# Patient Record
Sex: Male | Born: 1955 | Race: White | Hispanic: No | Marital: Married | Smoking: Never smoker
Health system: Southern US, Community
[De-identification: ages and names within clinical notes are randomized; demographics above are authoritative.]

## PROBLEM LIST (undated history)

## (undated) DIAGNOSIS — I4891 Unspecified atrial fibrillation: Principal | ICD-10-CM

## (undated) DIAGNOSIS — N401 Enlarged prostate with lower urinary tract symptoms: Secondary | ICD-10-CM

## (undated) DIAGNOSIS — Z1211 Encounter for screening for malignant neoplasm of colon: Secondary | ICD-10-CM

## (undated) DIAGNOSIS — E785 Hyperlipidemia, unspecified: Principal | ICD-10-CM

## (undated) DIAGNOSIS — R3916 Straining to void: Principal | ICD-10-CM

## (undated) DIAGNOSIS — R6889 Other general symptoms and signs: Secondary | ICD-10-CM

## (undated) DIAGNOSIS — H9193 Unspecified hearing loss, bilateral: Secondary | ICD-10-CM

## (undated) DIAGNOSIS — I10 Essential (primary) hypertension: Secondary | ICD-10-CM

## (undated) DIAGNOSIS — I214 Non-ST elevation (NSTEMI) myocardial infarction: Secondary | ICD-10-CM

## (undated) DIAGNOSIS — N200 Calculus of kidney: Secondary | ICD-10-CM

## (undated) DIAGNOSIS — N179 Acute kidney failure, unspecified: Secondary | ICD-10-CM

## (undated) DIAGNOSIS — E7800 Pure hypercholesterolemia, unspecified: Secondary | ICD-10-CM

## (undated) DIAGNOSIS — I2510 Atherosclerotic heart disease of native coronary artery without angina pectoris: Secondary | ICD-10-CM

## (undated) DIAGNOSIS — K219 Gastro-esophageal reflux disease without esophagitis: Secondary | ICD-10-CM

## (undated) HISTORY — PX: TONSILLECTOMY: SUR1361

## (undated) HISTORY — DX: Essential (primary) hypertension: I10

---
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
## 2011-03-18 ENCOUNTER — Encounter: Payer: Self-pay | Admitting: *Deleted

## 2011-03-18 ENCOUNTER — Emergency Department
Admission: EM | Admit: 2011-03-18 | Discharge: 2011-03-18 | Disposition: A | Discharge status: Home / Self Care | Payer: Self-pay | Source: Home / Self Care | Attending: Emergency Medicine | Admitting: Emergency Medicine

## 2011-03-18 DIAGNOSIS — R059 Cough, unspecified: Secondary | ICD-10-CM

## 2011-03-18 DIAGNOSIS — J069 Acute upper respiratory infection, unspecified: Secondary | ICD-10-CM

## 2011-03-18 DIAGNOSIS — R05 Cough: Secondary | ICD-10-CM

## 2011-03-18 MED ORDER — HYDROCODONE-HOMATROPINE 5-1.5 MG/5ML PO SYRP: 5.0000 mL | ORAL_SOLUTION | Freq: Four times a day (QID) | ORAL | Status: AC | PRN

## 2011-03-18 MED ORDER — AZITHROMYCIN 250 MG PO TABS: ORAL_TABLET | ORAL | Status: AC

## 2011-03-18 NOTE — ED Provider Notes (Signed)
History     CSN: [id number] Arrival date & time: 03/18/2011  4:13 PM   None     Chief Complaint  Patient presents with  . Sore Throat  . Cough    (Consider location/radiation/quality/duration/timing/severity/associated sxs/prior treatment) HPI Karsen is a 55 y.o. male who complains of onset of cold symptoms for 3 days.  They are using Mucinex which helps a little bit. + sore throat + cough No pleuritic pain No wheezing + nasal congestion + post-nasal drainage + sinus pain/pressure No chest congestion No itchy/red eyes No earache No hemoptysis No SOB No chills/sweats No fever No nausea No vomiting No abdominal pain No diarrhea No skin rashes No fatigue No myalgias No headache    History reviewed. No pertinent past medical history.  Past Surgical History  Procedure Date  . Tonsillectomy   . Inner ear surgery     Family History  Problem Relation Age of Onset  . Heart failure Mother     History  Substance Use Topics  . Smoking status: Never Smoker   . Smokeless tobacco: Not on file  . Alcohol Use: No      Review of Systems  Allergies  Amoxicillin  Home Medications   Current Outpatient Rx  Name Route Sig Dispense Refill  . ASPIRIN 81 MG PO TABS Oral Take 81 mg by mouth daily.      . MULTIVITAMINS PO CAPS Oral Take 1 capsule by mouth daily.      [name] RANITIDINE HCL 150 MG PO TABS Oral Take 150 mg by mouth 2 (two) times daily.      . AZITHROMYCIN 250 MG PO TABS  Use as directed 1 each 0  . HYDROCODONE-HOMATROPINE 5-1.5 MG/5ML PO SYRP Oral Take 5 mLs by mouth every 6 (six) hours as needed for cough. 120 mL 0    BP 176/95  Pulse 89  Temp(Src) 99.3 F (37.4 C) (Oral)  Resp 18  Ht 5\' 9"  (1.753 m)  Wt 192 lb 4 oz (87.204 kg)  BMI 28.39 kg/m2  SpO2 98%  Physical Exam  Nursing note and vitals reviewed. Constitutional: He is oriented to person, place, and time. He appears well-developed and well-nourished.  HENT:  Head: Normocephalic and  atraumatic.  Right Ear: Tympanic membrane, external ear and ear canal normal.  Left Ear: Tympanic membrane, external ear and ear canal normal.  Nose: Mucosal edema and rhinorrhea present.  Mouth/Throat: Posterior oropharyngeal erythema present. No oropharyngeal exudate or posterior oropharyngeal edema.       L TM erythema secondary to prev ear surgery  Neck: Neck supple.  Cardiovascular: Regular rhythm and normal heart sounds.   Pulmonary/Chest: Effort normal and breath sounds normal. No respiratory distress.  Neurological: He is alert and oriented to person, place, and time.  Skin: Skin is warm and dry.  Psychiatric: He has a normal mood and affect. His speech is normal.    ED Course  Procedures (including critical care time)  Labs Reviewed - No data to display No results found.   1. Acute upper respiratory infections of unspecified site   2. Cough       MDM    1)  Take the prescribed antibiotic as instructed. 2)  Use nasal saline solution (over the counter) at least 3 times a day. 3)  Use over the counter decongestants like Zyrtec-D every 12 hours as needed to help with congestion.  If you have hypertension, do not take medicines with sudafed.  4)  Can take tylenol every  6 hours or motrin every 8 hours for pain or fever. 5)  Follow up with your primary doctor if no improvement in 5-7 days, sooner if increasing pain, fever, or new symptoms.    Needs to talk to his PCP about his HTN.  ER precautions given.      Lily Kocher, MD 03/18/11 [phone]

## 2011-03-18 NOTE — ED Notes (Signed)
Pt c/o productive cough, sore throat, HA, and runny nose x 3 days. He took Nyquil, Mucinex, and Aleve on Saturday.

## 2011-06-17 ENCOUNTER — Encounter: Payer: Self-pay | Admitting: Family Medicine

## 2011-06-17 ENCOUNTER — Ambulatory Visit (INDEPENDENT_AMBULATORY_CARE_PROVIDER_SITE_OTHER): Payer: Commercial Indemnity | Admitting: Family Medicine

## 2011-06-17 VITALS — BP 153/94 | HR 74 | Ht 69.0 in | Wt 198.0 lb

## 2011-06-17 DIAGNOSIS — N400 Enlarged prostate without lower urinary tract symptoms: Secondary | ICD-10-CM | POA: Insufficient documentation

## 2011-06-17 DIAGNOSIS — Z0000 Encounter for general adult medical examination without abnormal findings: Secondary | ICD-10-CM

## 2011-06-17 DIAGNOSIS — Z23 Encounter for immunization: Secondary | ICD-10-CM

## 2011-06-17 DIAGNOSIS — N529 Male erectile dysfunction, unspecified: Secondary | ICD-10-CM

## 2011-06-17 NOTE — Patient Instructions (Signed)
Start a regular exercise program and make sure you are eating a healthy diet Try to eat 4 servings of dairy a day or take a calcium supplement (500mg twice a day). Your vaccines are up to date.   

## 2011-06-17 NOTE — Progress Notes (Signed)
Subjective:    Patient ID: Lee Taylor, male    DOB: 1955-06-13, 56 y.o.   MRN: [id number]  HPI Take 81mg  ASA for heart proctection.  Hx of high BP but did well and was able to get off meds.  No NSAIDs today.  Here for CPE today. Hasn't had one in years. Long time hx of ED. he has noted some decrease in urinary stream. He also says sometimes he'll get the urge to go and then won't urinate very much. He often wakes up one to 2 times at night to urinate. He was told he had a large prostate about 5 years ago.  Review of Systems  Constitutional: Negative for fever, diaphoresis and unexpected weight change.  HENT: Negative for hearing loss, rhinorrhea, sneezing and tinnitus.   Eyes: Negative for visual disturbance.  Respiratory: Negative for cough and wheezing.   Cardiovascular: Negative for chest pain and palpitations.  Gastrointestinal: Negative for nausea, vomiting, diarrhea and blood in stool.  Genitourinary: Negative for dysuria and discharge.  Musculoskeletal: Positive for myalgias and joint swelling. Negative for arthralgias.  Skin: Negative for rash.  Neurological: Negative for headaches.  Hematological: Negative for adenopathy.  Psychiatric/Behavioral: Negative for sleep disturbance and dysphoric mood. The patient is not nervous/anxious.        BP 146/82  Pulse 110  Ht 5\' 9"  (1.753 m)  Wt 198 lb (89.812 kg)  BMI 29.24 kg/m2    Allergies  Allergen Reactions  . Amoxicillin     Past Medical History  Diagnosis Date  . Hypertension     Past Surgical History  Procedure Date  . Tonsillectomy   . Inner ear surgery     History   Social History  . Marital Status: Single    Spouse Name: [name]     Number of Children: 2  . Years of Education: N/A   Occupational History  . Museum/gallery exhibitions officer    Social History Main Topics  . Smoking status: Never Smoker   . Smokeless tobacco: Not on file  . Alcohol Use: No  . Drug Use: No  . Sexually Active: Yes -- Male  partner(s)   Other Topics Concern  . Not on file   Social History Narrative  . No narrative on file    Family History  Problem Relation Age of Onset  . Heart failure Mother   . Heart attack Sister 16    smoker    . Heart disease Mother     triple bypass      Objective:   Physical Exam  Constitutional: He is oriented to person, place, and time. He appears well-developed and well-nourished.  HENT:  Head: Normocephalic and atraumatic.  Right Ear: External ear normal.  Left Ear: External ear normal.  Nose: Nose normal.  Mouth/Throat: Oropharynx is clear and moist.  Eyes: Conjunctivae and EOM are normal. Pupils are equal, round, and reactive to light.  Neck: Normal range of motion. Neck supple. No thyromegaly present.  Cardiovascular: Normal rate, regular rhythm, normal heart sounds and intact distal pulses.   Pulmonary/Chest: Effort normal and breath sounds normal.  Abdominal: Soft. Bowel sounds are normal. He exhibits no distension and no mass. There is no tenderness. There is no rebound and no guarding.  Genitourinary: Rectum normal. Guaiac negative stool.       Prostate is enlarged. No tenderness or bogginess. No nodules. It is asymmetric.  Musculoskeletal: Normal range of motion.  Lymphadenopathy:    He has no cervical adenopathy.  Neurological: He  is alert and oriented to person, place, and time. He has normal reflexes.  Skin: Skin is warm and dry.  Psychiatric: He has a normal mood and affect. His behavior is normal. Judgment and thought content normal.          Assessment & Plan:  CPE -  Start a regular exercise program and make sure you are eating a healthy diet Try to eat 4 servings of dairy a day or take a calcium supplement (500mg  twice a day). Your vaccines are up to date.  We will call you with your lab results. If you don't here from [place] in about a week then please give [place] a call at [phone].  BPH- he does have enlargement on exam today. His AUA score is  7 which is mild. I recommend retest in one year. At that time if he is more moderate or severe then consider treatment.  Erectile dysfunction-he says he's had this since the early 1990s. He has seen urology in the past and has tried Viagra, Levitra, Cialis with no help in his symptoms. He is not discuss any further options. I offered to refer him back to urology for further evaluation for other treatment therapies but he declined at this time. His ED score was 2. Passage of a low testosterone questionnaire and he screens negative.  Tdap given today.

## 2011-06-19 LAB — LIPID PANEL: HDL: 54 mg/dL (ref 35–70)

## 2011-06-19 LAB — BASIC METABOLIC PANEL
BUN: 20 mg/dL (ref 4–21)
Creatinine: 1.2 mg/dL (ref 0.6–1.3)

## 2011-06-24 ENCOUNTER — Ambulatory Visit (INDEPENDENT_AMBULATORY_CARE_PROVIDER_SITE_OTHER): Payer: Commercial Indemnity | Admitting: Family Medicine

## 2011-06-24 DIAGNOSIS — I10 Essential (primary) hypertension: Secondary | ICD-10-CM | POA: Insufficient documentation

## 2011-06-24 MED ORDER — LISINOPRIL 20 MG PO TABS: 20.0000 mg | ORAL_TABLET | Freq: Every day | ORAL | Status: DC

## 2011-06-24 NOTE — Progress Notes (Signed)
Patient ID: Lee Taylor, male   DOB: [birth year], 56 y.o.   MRN: [id number] Pt comes in for repeat BP check. He is not on any BP medication. He denies any Sxs related to HTN. Pt states it is OK to call his girlfriend [place] w/ any updates or changes. [phone]    Hypertension-his blood pressure is still elevated today. He needs to be started on a low-dose blood pressure medication. I will send her for lisinopril to his pharmacy. Also recommend eating a low salt diet, regular exercise and follow up with me in one month. Nani Gasser, MD

## 2011-06-25 NOTE — Progress Notes (Signed)
Lee Taylor will give message to pt and will p/u rx.

## 2011-06-26 ENCOUNTER — Telehealth: Payer: Self-pay | Admitting: Family Medicine

## 2011-06-26 NOTE — Telephone Encounter (Signed)
His CMP is normal. His cholesterol looks fantastic and his prostate test is normal. If he has any questions please let me know.

## 2011-06-30 ENCOUNTER — Other Ambulatory Visit: Payer: Self-pay | Admitting: *Deleted

## 2011-06-30 MED ORDER — LISINOPRIL 20 MG PO TABS: 20.0000 mg | ORAL_TABLET | Freq: Every day | ORAL | Status: DC

## 2011-06-30 NOTE — Telephone Encounter (Signed)
Pts wife informed.

## 2011-07-02 ENCOUNTER — Encounter: Payer: Self-pay | Admitting: *Deleted

## 2011-07-02 LAB — PSA: PSA: 2.2 ng/mL

## 2011-10-05 ENCOUNTER — Other Ambulatory Visit: Payer: Self-pay | Admitting: Family Medicine

## 2011-10-07 ENCOUNTER — Other Ambulatory Visit: Payer: Self-pay | Admitting: *Deleted

## 2011-10-07 MED ORDER — LISINOPRIL 20 MG PO TABS: 20.0000 mg | ORAL_TABLET | Freq: Every day | ORAL | Status: DC

## 2012-01-06 ENCOUNTER — Other Ambulatory Visit: Payer: Self-pay | Admitting: Family Medicine

## 2012-04-13 ENCOUNTER — Other Ambulatory Visit: Payer: Self-pay | Admitting: Family Medicine

## 2012-04-13 NOTE — Telephone Encounter (Signed)
Pt needs appointment

## 2012-04-14 ENCOUNTER — Other Ambulatory Visit: Payer: Self-pay | Admitting: Family Medicine

## 2012-04-14 NOTE — Telephone Encounter (Signed)
Needs appointment

## 2012-04-16 ENCOUNTER — Other Ambulatory Visit: Payer: Self-pay | Admitting: Family Medicine

## 2012-04-16 NOTE — Telephone Encounter (Signed)
Pt must make appointment

## 2012-05-19 ENCOUNTER — Other Ambulatory Visit: Payer: Self-pay | Admitting: Family Medicine

## 2012-06-21 ENCOUNTER — Other Ambulatory Visit: Payer: Self-pay | Admitting: Family Medicine

## 2012-06-23 ENCOUNTER — Other Ambulatory Visit: Payer: Self-pay | Admitting: Family Medicine

## 2012-07-26 ENCOUNTER — Telehealth: Payer: Self-pay | Admitting: Family Medicine

## 2012-07-26 NOTE — Telephone Encounter (Signed)
cerre

## 2012-07-30 ENCOUNTER — Encounter: Payer: Self-pay | Admitting: Sports Medicine

## 2012-07-30 ENCOUNTER — Ambulatory Visit (INDEPENDENT_AMBULATORY_CARE_PROVIDER_SITE_OTHER): Payer: Commercial Indemnity | Admitting: Sports Medicine

## 2012-07-30 VITALS — BP 142/84 | HR 94 | Wt 187.0 lb

## 2012-07-30 DIAGNOSIS — M7742 Metatarsalgia, left foot: Secondary | ICD-10-CM | POA: Insufficient documentation

## 2012-07-30 DIAGNOSIS — M7750 Other enthesopathy of unspecified foot: Secondary | ICD-10-CM

## 2012-07-30 NOTE — Progress Notes (Signed)
   Subjective:    CC: Foot pain  HPI: This pleasant young male has noted couple of weeks the pain that he localizes under the third metatarsal head on his left foot. He's also noted some swelling. 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 Denies any trauma. Pain is localized and does not radiate is moderate.  Past medical history, Surgical history, Family history not pertinant except as noted below, Social history, Allergies, and medications have been entered into the medical record, reviewed, and no changes needed.   Review of Systems: No headache, visual changes, nausea, vomiting, diarrhea, constipation, dizziness, abdominal pain, skin rash, fevers, chills, night sweats, weight loss, swollen lymph nodes, body aches, joint swelling, muscle aches, chest pain, shortness of breath, mood changes, visual or auditory hallucinations.   Objective:   General: Well Developed, well nourished, and in no acute distress.  Neuro/Psych: Alert and oriented x3, extra-ocular muscles intact, able to move all 4 extremities, sensation grossly intact. Skin: Warm and dry, no rashes noted.  Respiratory: Not using accessory muscles, speaking in full sentences, trachea midline.  Cardiovascular: Pulses palpable, no extremity edema. Abdomen: Does not appear distended. Left foot: Foot inspection and palpation reveals breakdown of the transverse arch and a drop of MT heads: Abnormal callous is present: Under third metatarsal head Hammer toes: None TTP: Under third metatarsal head.  Medium metatarsal pad placed in shoe, resolution of pain noted. Impression and Recommendations:   This case required medical decision making of moderate complexity.

## 2012-07-30 NOTE — Assessment & Plan Note (Signed)
With breakdown of the transverse arch and abnormal callous under the third metatarsal head. Metatarsal pad placed behind metatarsal head. He will come back for custom orthotics.

## 2012-08-02 ENCOUNTER — Ambulatory Visit (INDEPENDENT_AMBULATORY_CARE_PROVIDER_SITE_OTHER): Payer: Commercial Indemnity | Admitting: Sports Medicine

## 2012-08-02 DIAGNOSIS — M7742 Metatarsalgia, left foot: Secondary | ICD-10-CM

## 2012-08-02 DIAGNOSIS — M7750 Other enthesopathy of unspecified foot: Secondary | ICD-10-CM

## 2012-08-02 NOTE — Progress Notes (Signed)
    Patient was fitted for a : standard, cushioned, semi-rigid orthotic. The orthotic was heated and afterward the patient stood on the orthotic blank positioned on the orthotic stand. The patient was positioned in subtalar neutral position and 10 degrees of ankle dorsiflexion in a weight bearing stance. After completion of molding, a stable base was applied to the orthotic blank. The blank was ground to a stable position for weight bearing. Size: 10 Base: Blue EVA Additional Posting and Padding: Metatarsal pad placed in the left foot. The patient ambulated these, and they were very comfortable.  I spent 40 minutes with this patient, greater than 50% was face-to-face time counseling regarding the below diagnosis.

## 2012-08-02 NOTE — Assessment & Plan Note (Signed)
Symptoms were resolving with metatarsal pad placed in tennis shoes. Custom orthotics as above. Return in one month.

## 2012-08-30 ENCOUNTER — Ambulatory Visit: Payer: Commercial Indemnity | Admitting: Sports Medicine

## 2012-09-28 ENCOUNTER — Other Ambulatory Visit: Payer: Self-pay | Admitting: Family Medicine

## 2012-10-19 ENCOUNTER — Encounter: Payer: Self-pay | Admitting: Family Medicine

## 2012-10-19 ENCOUNTER — Ambulatory Visit (INDEPENDENT_AMBULATORY_CARE_PROVIDER_SITE_OTHER): Payer: Commercial Indemnity | Admitting: Family Medicine

## 2012-10-19 VITALS — BP 132/92 | HR 76 | Wt 188.0 lb

## 2012-10-19 DIAGNOSIS — M719 Bursopathy, unspecified: Secondary | ICD-10-CM

## 2012-10-19 DIAGNOSIS — M7150 Other bursitis, not elsewhere classified, unspecified site: Secondary | ICD-10-CM

## 2012-10-19 MED ORDER — DICLOFENAC SODIUM 1 % TD GEL: TRANSDERMAL | Status: DC

## 2012-10-19 NOTE — Progress Notes (Signed)
CC: Lee Taylor is a 57 y.o. male is here for left knee pain   Subjective: HPI:  Patient complains of left knee pain described only has pain, localized to the middle aspect of the knee just below the kneecap. Described as mild in severity with mild swelling. This started one month ago when he banged his knee into a table pain was immediate improved and was absent within 3 days. 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 States that pain is only reproducible with extending the leg or sidestepping up an incline. No pain with bearing weight or at rest.. pain is nonradiating, he denies overlying skin changes or pain within the knee. Denies weakness, motor or sensory disturbances, ankle pain, groin pain, peripheral edema. No interventions as of yet   . Review Of Systems Outlined In HPI  Past Medical History  Diagnosis Date  . Hypertension      Family History  Problem Relation Age of Onset  . Heart failure Mother   . Heart attack Sister 76    smoker    . Heart disease Mother     triple bypass      History  Substance Use Topics  . Smoking status: Never Smoker   . Smokeless tobacco: Not on file  . Alcohol Use: No     Objective: Filed Vitals:   10/19/12 1138  BP: 132/92  Pulse: 76    General: Alert and Oriented, No Acute Distress Lungs: Clear comfortable work of breathing Cardiac: Regular rate and rhythm with strong pulses  Extremities: No peripheral edema.  Left knee: No pain without a sore varus stress, negative anterior drawer, full range of motion and strength without pain. Pain is reproduced with palpation of pes ansernine complex with a hard mobile painful cyst at the inferior most aspect approximately half a centimeter in diameter Mental Status: No depression, anxiety, nor agitation. Skin: Warm and dry. Without overlying skin changes  Assessment & Plan: Lee Taylor was seen today for  left knee pain.  Diagnoses and associated orders for this visit:  Bursitis - diclofenac sodium (VOLTAREN) 1 % GEL; Apply one finger length to inner knee three times a day for two weeks.    Discussed with patient believes his pain and swelling is due to bursitis, pes ansernine, he may have a overlying slightly inflamed epidermoid cyst all of which should improve with topical diclofenac 3 times a day, and hamstring stretching handout with therapy and performed once a day for the next 2 weeks. If not improved at that time return for Dr. Karie Schwalbe. sports medicine referral  Return in about 2 weeks (around 11/02/2012).

## 2013-02-21 ENCOUNTER — Other Ambulatory Visit: Payer: Self-pay | Admitting: Family Medicine

## 2013-03-03 ENCOUNTER — Other Ambulatory Visit: Payer: Self-pay | Admitting: Family Medicine

## 2013-03-28 ENCOUNTER — Other Ambulatory Visit: Payer: Self-pay | Admitting: Family Medicine

## 2013-03-28 ENCOUNTER — Ambulatory Visit (INDEPENDENT_AMBULATORY_CARE_PROVIDER_SITE_OTHER): Payer: Commercial Indemnity

## 2013-03-28 ENCOUNTER — Encounter: Payer: Self-pay | Admitting: Family Medicine

## 2013-03-28 ENCOUNTER — Ambulatory Visit (INDEPENDENT_AMBULATORY_CARE_PROVIDER_SITE_OTHER): Payer: Commercial Indemnity | Admitting: Family Medicine

## 2013-03-28 VITALS — BP 126/82 | HR 84 | Temp 97.0°F | Ht 70.0 in | Wt 182.0 lb

## 2013-03-28 DIAGNOSIS — M25562 Pain in left knee: Secondary | ICD-10-CM

## 2013-03-28 DIAGNOSIS — M25569 Pain in unspecified knee: Secondary | ICD-10-CM

## 2013-03-28 NOTE — Patient Instructions (Signed)
Elevate and Ice when needed Aleve up to twice a day as needed with food and water.

## 2013-03-28 NOTE — Progress Notes (Signed)
  Subjective:    Patient ID: Lee Taylor, male    DOB: 1956/04/01, 57 y.o.   MRN: [id number]  HPI Pain on the left knee just below and lateral to the patella. Has been painful for month. Hard to kneel.  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  Not icing it.  Occ takes an ALEVE, and does help.  Better at rest.  No poppping cracking or giving out.  Not sure if old injury or not.  Does remember old twisting injury in his early 11s.     Review of Systems     Objective:   Physical Exam  Constitutional: He appears well-developed and well-nourished.  Musculoskeletal:  Left knee with NROM, no laxity. Tender along the medial joint line and just later to the lower edge of the patella and patellar tendon. nontender over the tendon. Neg McMurrays test.  Patellar reflexes 2+ bilat. No swelling.   Skin: Skin is warm and dry.  Psychiatric: He has a normal mood and affect. His behavior is normal.          Assessment & Plan:  Left knee pain - No specific trauma.  Will get xray today to eval for any sig OA.  Recommend ice and NSAID prn.  Also consider referral to sports med.  Consider cartilage tear.  Elevated when needed.  Today his pain is better.

## 2013-04-06 ENCOUNTER — Other Ambulatory Visit: Payer: Self-pay | Admitting: Family Medicine

## 2013-05-23 ENCOUNTER — Telehealth: Payer: Self-pay

## 2013-05-23 ENCOUNTER — Telehealth: Payer: Self-pay | Admitting: *Deleted

## 2013-05-23 NOTE — Telephone Encounter (Signed)
Error

## 2013-05-23 NOTE — Telephone Encounter (Signed)
Change pharmacy

## 2013-05-24 ENCOUNTER — Telehealth: Payer: Self-pay | Admitting: *Deleted

## 2013-05-24 MED ORDER — LISINOPRIL 20 MG PO TABS: ORAL_TABLET | ORAL | Status: DC

## 2013-05-24 NOTE — Telephone Encounter (Signed)
Called and lvm informing pt that he

## 2013-05-24 NOTE — Telephone Encounter (Signed)
Pt informed that he will need to be seen before refill is given appt made for Friday.[name].[name].Lee PacasBarkley, [name]

## 2013-05-25 ENCOUNTER — Ambulatory Visit: Payer: Commercial Indemnity | Admitting: Family Medicine

## 2013-06-03 ENCOUNTER — Ambulatory Visit (INDEPENDENT_AMBULATORY_CARE_PROVIDER_SITE_OTHER): Payer: BC Managed Care – PPO | Admitting: Family Medicine

## 2013-06-03 ENCOUNTER — Encounter: Payer: Self-pay | Admitting: Family Medicine

## 2013-06-03 VITALS — BP 138/82 | HR 84 | Temp 98.2°F | Ht 70.0 in | Wt 193.0 lb

## 2013-06-03 DIAGNOSIS — Z1211 Encounter for screening for malignant neoplasm of colon: Secondary | ICD-10-CM

## 2013-06-03 DIAGNOSIS — K219 Gastro-esophageal reflux disease without esophagitis: Secondary | ICD-10-CM

## 2013-06-03 DIAGNOSIS — I10 Essential (primary) hypertension: Secondary | ICD-10-CM

## 2013-06-03 DIAGNOSIS — Z125 Encounter for screening for malignant neoplasm of prostate: Secondary | ICD-10-CM

## 2013-06-03 MED ORDER — LISINOPRIL 20 MG PO TABS: 20.0000 mg | ORAL_TABLET | Freq: Every day | ORAL | Status: DC

## 2013-06-03 NOTE — Progress Notes (Signed)
   Subjective:    Patient ID: Lee Taylor, male    DOB: 10/10/1955, 58 y.o.   MRN: [id number]  HPI Hypertension- Pt denies chest pain, SOB, dizziness, or heart palpitations.  Taking meds as directed w/o problems.  Denies medication side effects.    GERD- He uses his Zantac once a day and occ twice a day.  Uses OTC.    BPH- new major changes in symptoms. He's not having any urinary frequency at night. He has noticed occasional urinary hesitancy but it's infrequent.  Review of Systems     Objective:   Physical Exam  Constitutional: He is oriented to person, place, and time. He appears well-developed and well-nourished.  HENT:  Head: Normocephalic and atraumatic.  Cardiovascular: Normal rate, regular rhythm and normal heart sounds.   Pulmonary/Chest: Effort normal and breath sounds normal.  Neurological: He is alert and oriented to person, place, and time.  Skin: Skin is warm and dry.  Psychiatric: He has a normal mood and affect. His behavior is normal.          Assessment & Plan:  HTN - well controlled. F/U in 6 months. RF sent ot pharmacy.    GERD - Well controlled on current regimen.    BPH-we'll recheck PSA today. No major changes in symptoms. We did discuss that if he starts to notice changes or worsening of symptoms that he should come in to discuss treatment options for BPH.  Overdue for screening colonoscopy. He has never had one. We discussed the available options. He is okay with moving forward with a colonoscopy. Prefers to be referred here locally. [facility]

## 2013-08-11 ENCOUNTER — Other Ambulatory Visit: Payer: Self-pay | Admitting: *Deleted

## 2013-08-11 DIAGNOSIS — I10 Essential (primary) hypertension: Secondary | ICD-10-CM

## 2013-08-13 LAB — LIPID PANEL
CHOL/HDL RATIO: 3.4 ratio
CHOLESTEROL: 179 mg/dL (ref 0–200)
HDL: 53 mg/dL (ref >39)
LDL Cholesterol: 101 mg/dL — ABNORMAL HIGH (ref 0–99)
TRIGLYCERIDES: 124 mg/dL (ref <150)
VLDL: 25 mg/dL (ref 0–40)

## 2013-08-13 LAB — COMPLETE METABOLIC PANEL WITH GFR
ALBUMIN: 4.4 g/dL (ref 3.5–5.2)
ALK PHOS: 71 U/L (ref 39–117)
ALT: 36 U/L (ref 0–53)
AST: 25 U/L (ref 0–37)
BILIRUBIN TOTAL: 0.6 mg/dL (ref 0.2–1.2)
BUN: 17 mg/dL (ref 6–23)
CO2: 31 mEq/L (ref 19–32)
Calcium: 9.3 mg/dL (ref 8.4–10.5)
Chloride: 102 mEq/L (ref 96–112)
Creat: 1.19 mg/dL (ref 0.50–1.35)
GFR, EST NON AFRICAN AMERICAN: 67 mL/min
GFR, Est African American: 78 mL/min
GLUCOSE: 78 mg/dL (ref 70–99)
POTASSIUM: 4.9 meq/L (ref 3.5–5.3)
Sodium: 140 mEq/L (ref 135–145)
Total Protein: 6.7 g/dL (ref 6.0–8.3)

## 2013-08-13 LAB — PSA: PSA: 2.44 ng/mL (ref <=4.00)

## 2013-08-15 NOTE — Progress Notes (Signed)
Quick Note:  All labs are normal. ______ 

## 2013-10-06 ENCOUNTER — Encounter: Payer: Self-pay | Admitting: Family Medicine

## 2013-11-25 ENCOUNTER — Ambulatory Visit (INDEPENDENT_AMBULATORY_CARE_PROVIDER_SITE_OTHER): Payer: BC Managed Care – PPO | Admitting: Family Medicine

## 2013-11-25 ENCOUNTER — Encounter: Payer: Self-pay | Admitting: Family Medicine

## 2013-11-25 VITALS — BP 128/85 | HR 79 | Ht 70.0 in | Wt 191.0 lb

## 2013-11-25 DIAGNOSIS — R209 Unspecified disturbances of skin sensation: Secondary | ICD-10-CM

## 2013-11-25 DIAGNOSIS — R208 Other disturbances of skin sensation: Secondary | ICD-10-CM

## 2013-11-25 DIAGNOSIS — I10 Essential (primary) hypertension: Secondary | ICD-10-CM

## 2013-11-25 DIAGNOSIS — R200 Anesthesia of skin: Secondary | ICD-10-CM

## 2013-11-25 DIAGNOSIS — K219 Gastro-esophageal reflux disease without esophagitis: Secondary | ICD-10-CM

## 2013-11-25 NOTE — Progress Notes (Signed)
Subjective:    Patient ID: Lee Taylor, male    DOB: May 01, 1956, 58 y.o.   MRN: [id number]  HPI Hypertension- Pt denies chest pain, SOB, dizziness, or heart palpitations.  Taking meds as directed w/o problems.  Denies medication side effects.    Numbness on left foot over the 3,4,5th toes at times. He denies any actual pain. Started about 1-2 weeks ago. No injury or trauma. He actually has been off work for the last week or so it has been wearing flip-flops a little but more often. Otherwise he denies any change in exercise activity. If anything he says he's been on his feet less since he's been on vacation. He occasionally gets some pain over the anterior portion of the ankle which is unrelated. No swelling. No pain on the bottom of his foot. He denies any back pain or numbness down the leg.  GERD-doing well on the ranitidine. Symptoms are well-controlled. Review of Systems  BP 128/85  Pulse 79  Ht 5\' 10"  (1.778 m)  Wt 191 lb (86.637 kg)  BMI 27.41 kg/m2    Allergies  Allergen Reactions  . Amoxicillin     Past Medical History  Diagnosis Date  . Hypertension     Past Surgical History  Procedure Laterality Date  . Tonsillectomy    . Inner ear surgery      History   Social History  . Marital Status: Single    Spouse Name: [name]     Number of Children: 2  . Years of Education: N/A   Occupational History  . Museum/gallery exhibitions officer    Social History Main Topics  . Smoking status: Never Smoker   . Smokeless tobacco: Not on file  . Alcohol Use: No  . Drug Use: No  . Sexual Activity: Yes    Partners: Female   Other Topics Concern  . Not on file   Social History Narrative  . No narrative on file    Family History  Problem Relation Age of Onset  . Heart failure Mother   . Heart attack Sister 29    smoker    . Heart disease Mother     triple bypass     Outpatient Encounter Prescriptions as of 11/25/2013  Medication Sig  . aspirin 81 MG tablet Take 81  mg by mouth daily.    . Calcium Carb-Cholecalciferol 600-800 MG-UNIT TABS Take by mouth.  [name] CINNAMON PO Take by mouth.  . Ginkgo Biloba 40 MG TABS Take 60 mg by mouth.  [name] lisinopril (PRINIVIL,ZESTRIL) 20 MG tablet Take 1 tablet (20 mg total) by mouth daily.  . Multiple Vitamin (MULTIVITAMIN) capsule Take 1 capsule by mouth daily.    . Multiple Vitamins-Minerals (OCUVITE PO) Take by mouth.  . polycarbophil (FIBERCON) 625 MG tablet Take 625 mg by mouth daily.  . ranitidine (ZANTAC) 150 MG tablet Take 150 mg by mouth 2 (two) times daily.    . vitamin C (ASCORBIC ACID) 500 MG tablet Take 500 mg by mouth daily.  . [DISCONTINUED] diclofenac sodium (VOLTAREN) 1 % GEL Apply one finger length to inner knee three times a day for two weeks.          Objective:   Physical Exam  Constitutional: He is oriented to person, place, and time. He appears well-developed and well-nourished.  HENT:  Head: Normocephalic and atraumatic.  Cardiovascular: Normal rate, regular rhythm and normal heart sounds.   Pulmonary/Chest: Effort normal and breath sounds normal.  Musculoskeletal:  Left foot  appears to be normal on exam. No swelling or erythema. Ankle with normal range of motion. Dorsal pedal pulse and posterior tibial pulses 2+.  Toes with normal range of motion and strength. Nontender over the metatarsals or with rubbing the metatarsal heads together.  Neurological: He is alert and oriented to person, place, and time.  Skin: Skin is warm and dry.  Psychiatric: He has a normal mood and affect. His behavior is normal.          Assessment & Plan:  HTN- well controlled on current regimen.  GERD-continue current regimen. Symptoms are well-controlled. Next  Numbness over the third, fourth, fifth toes on the left foot. Suspect it may be secondary to change in footwear such as wearing flip-flops. I would like to see what effect it working back in his normal routine if it seems to resolve on its own. There  no known triggers it just happens. Also consider wearing arch supports to take pressure off the metatarsals and see if this makes a difference. If his symptoms persist or the next 2 weeks and please let me know.

## 2013-12-21 ENCOUNTER — Encounter: Payer: Self-pay | Admitting: Family Medicine

## 2013-12-21 ENCOUNTER — Ambulatory Visit (INDEPENDENT_AMBULATORY_CARE_PROVIDER_SITE_OTHER): Payer: BC Managed Care – PPO | Admitting: Family Medicine

## 2013-12-21 VITALS — BP 135/86 | HR 82 | Ht 70.0 in | Wt 188.0 lb

## 2013-12-21 DIAGNOSIS — M25569 Pain in unspecified knee: Secondary | ICD-10-CM

## 2013-12-21 DIAGNOSIS — M25562 Pain in left knee: Secondary | ICD-10-CM

## 2013-12-21 DIAGNOSIS — S838X2A Sprain of other specified parts of left knee, initial encounter: Secondary | ICD-10-CM

## 2013-12-21 MED ORDER — MELOXICAM 15 MG PO TABS: 15.0000 mg | ORAL_TABLET | Freq: Every day | ORAL | Status: DC

## 2013-12-21 NOTE — Patient Instructions (Signed)
Meloxicam tablets What is this medicine? MELOXICAM (mel OX i cam) is a non-steroidal anti-inflammatory drug (NSAID). It is used to reduce swelling and to treat pain. It may be used for osteoarthritis, rheumatoid arthritis, or juvenile rheumatoid arthritis. This medicine may be used for other purposes; ask your health care provider or pharmacist if you have questions. COMMON BRAND NAME(S): Mobic What should I tell my health care provider before I take this medicine? They need to know if you have any of these conditions: -asthma -cigarette smoker -coronary artery bypass graft (CABG) surgery within the past 2 weeks -drink more than 3 alcohol-containing drinks a day -heart disease or circulation problems such as heart failure or leg edema (fluid retention) -hemophilia or bleeding problems -high blood pressure -kidney disease -liver disease -stomach bleeding or ulcers -an unusual or allergic reaction to meloxicam, aspirin, other NSAIDs, other medicines, foods, dyes, or preservatives -pregnant or trying to get pregnant -breast-feeding How should I use this medicine? Take this medicine by mouth with a full glass of water. Follow the directions on the prescription label. Take this medicine in an upright or sitting position. If possible take bedtime doses at least 10 minutes before lying down. You can take it with or without food. If it upsets your stomach, take it with food. Take your medicine at regular intervals. Do not take it more often than directed. A special MedGuide will be given to you by the pharmacist with each prescription and refill. Be sure to read this information carefully each time. Talk to your pediatrician regarding the use of this medicine in children. Special care may be needed. Elderly patients over 65 years old may have a stronger reaction to this medicine and need smaller doses. Overdosage: If you think you have taken too much of this medicine contact a poison control center  or emergency room at once. NOTE: This medicine is only for you. Do not share this medicine with others. What if I miss a dose? If you miss a dose, take it as soon as you can. If it is almost time for your next dose, take only that dose. Do not take double or extra doses. What may interact with this medicine? -alcohol -aspirin -cidofovir -diuretics -lithium -medicines for high blood pressure -methotrexate -other drugs for inflammation like ketorolac, ibuprofen, and prednisone -pemetrexed -warfarin This list may not describe all possible interactions. Give your health care provider a list of all the medicines, herbs, non-prescription drugs, or dietary supplements you use. Also tell them if you smoke, drink alcohol, or use illegal drugs. Some items may interact with your medicine. What should I watch for while using this medicine? Tell your doctor or healthcare professional if your pain does not get better. Talk to your doctor before taking another medicine for pain. Do not treat yourself. This medicine does not prevent heart attack or stroke. If you take aspirin to prevent heart attack or stroke, talk with your doctor or health care professional. Do not take medicines such as ibuprofen and naproxen with this medicine. Side effects such as stomach upset, nausea, or ulcers may be more likely to occur. Many medicines available without a prescription should not be taken with this medicine. What side effects may I notice from receiving this medicine? Side effects that you should report to your doctor or health care professional as soon as possible: -black or bloody stools, blood in the urine or vomit -blurred vision -chest pain -difficulty breathing or wheezing -nausea or vomiting -skin rash, skin redness,   blistering or peeling skin, hives, or itching -slurred speech or weakness on one side of the body -swelling of eyelids, throat, lips -unexplained weight gain or swelling -unusually weak or  tired -yellowing of eyes or skin Side effects that usually do not require medical attention (report to your doctor or health care professional if they continue or are bothersome): -constipation or diarrhea -dizziness -gas or heartburn -stomach pain This list may not describe all possible side effects. Call your doctor for medical advice about side effects. You may report side effects to FDA at 1-800-FDA-1088. Where should I keep my medicine? Keep out of the reach of children. Store at room temperature between 15 and 30 degrees C (59 and 86 degrees F). Protect from moisture. Keep container tightly closed. Throw away any unused medicine after the expiration date. NOTE: This sheet is a summary. It may not cover all possible information. If you have questions about this medicine, talk to your doctor, pharmacist, or health care provider.  2015, Elsevier/[employer] Standard. (2009-08-13 21:15:42)  

## 2013-12-21 NOTE — Progress Notes (Signed)
   Subjective:    Patient ID: Lee Taylor, male    DOB: 06/01/1955, 58 y.o.   MRN: [id number]  HPI Left knee pain - Has been more painful for the last month or so. Better with rest. Pain is mostly medial, just below the joint line and radiates towards the posterior part of the knee.  And radiates behind the knee. Works on [employer] at [employer].  He did have x-rays done in November 2014 which were essentially negative for any significant arthritis. Better with rest, worse with activity. Is not currently taking medication for it.   Review of Systems     Objective:   Physical Exam  Constitutional: He is oriented to person, place, and time. He appears well-developed and well-nourished.  HENT:  Head: Normocephalic and atraumatic.  Musculoskeletal:  Left knee-no swelling or erythema. Nontender along the lateral joint line. Slightly tender along the medial joint line a couple of inches from the patella. He's also tender just above and below this area. Nontender over the hamstring itself. No significant discomfort with anterior-posterior drawer. No increased pain without as or very stressed. He did have pain with McMurray's but no actual click or pop.  Neurological: He is alert and oriented to person, place, and time.  Skin: Skin is warm and dry.  Psychiatric: He has a normal mood and affect.          Assessment & Plan:  Left knee pain-possible meniscal tear. I did have our sports medicine physician, Dr. [name] take a look at him and examine him as well. Recommendation for 4 weeks of physical therapy. If not improving then recommend that he schedule an appointment with Dr. [name] for further evaluation. We'll also put believe he is taking meloxicam in the past.him on anti-inflammatory. Make sure take with food and water and to stop immediately if any GI upset.

## 2014-01-04 ENCOUNTER — Ambulatory Visit (INDEPENDENT_AMBULATORY_CARE_PROVIDER_SITE_OTHER): Payer: BC Managed Care – PPO | Admitting: Physical Therapy

## 2014-01-04 DIAGNOSIS — S8990XA Unspecified injury of unspecified lower leg, initial encounter: Secondary | ICD-10-CM

## 2014-01-04 DIAGNOSIS — S99919A Unspecified injury of unspecified ankle, initial encounter: Secondary | ICD-10-CM

## 2014-01-04 DIAGNOSIS — S99929A Unspecified injury of unspecified foot, initial encounter: Secondary | ICD-10-CM

## 2014-01-04 DIAGNOSIS — M6281 Muscle weakness (generalized): Secondary | ICD-10-CM

## 2014-01-04 DIAGNOSIS — R269 Unspecified abnormalities of gait and mobility: Secondary | ICD-10-CM

## 2014-01-04 DIAGNOSIS — M25569 Pain in unspecified knee: Secondary | ICD-10-CM

## 2014-01-12 ENCOUNTER — Encounter: Payer: BC Managed Care – PPO | Admitting: Physical Therapy

## 2014-01-16 ENCOUNTER — Other Ambulatory Visit: Payer: Self-pay | Admitting: Family Medicine

## 2014-01-16 ENCOUNTER — Encounter (INDEPENDENT_AMBULATORY_CARE_PROVIDER_SITE_OTHER): Payer: BC Managed Care – PPO | Admitting: Physical Therapy

## 2014-01-16 DIAGNOSIS — M25569 Pain in unspecified knee: Secondary | ICD-10-CM

## 2014-01-16 DIAGNOSIS — S8990XA Unspecified injury of unspecified lower leg, initial encounter: Secondary | ICD-10-CM

## 2014-01-16 DIAGNOSIS — M25669 Stiffness of unspecified knee, not elsewhere classified: Secondary | ICD-10-CM

## 2014-01-16 DIAGNOSIS — M6281 Muscle weakness (generalized): Secondary | ICD-10-CM

## 2014-01-16 DIAGNOSIS — S99919A Unspecified injury of unspecified ankle, initial encounter: Secondary | ICD-10-CM

## 2014-01-16 DIAGNOSIS — R269 Unspecified abnormalities of gait and mobility: Secondary | ICD-10-CM

## 2014-01-16 DIAGNOSIS — S99929A Unspecified injury of unspecified foot, initial encounter: Secondary | ICD-10-CM

## 2014-01-18 ENCOUNTER — Encounter (INDEPENDENT_AMBULATORY_CARE_PROVIDER_SITE_OTHER): Payer: BC Managed Care – PPO | Admitting: Physical Therapy

## 2014-01-18 DIAGNOSIS — M25569 Pain in unspecified knee: Secondary | ICD-10-CM

## 2014-01-18 DIAGNOSIS — R269 Unspecified abnormalities of gait and mobility: Secondary | ICD-10-CM

## 2014-01-18 DIAGNOSIS — R609 Edema, unspecified: Secondary | ICD-10-CM

## 2014-01-18 DIAGNOSIS — M6281 Muscle weakness (generalized): Secondary | ICD-10-CM

## 2014-01-18 DIAGNOSIS — S8990XA Unspecified injury of unspecified lower leg, initial encounter: Secondary | ICD-10-CM

## 2014-01-18 DIAGNOSIS — M25669 Stiffness of unspecified knee, not elsewhere classified: Secondary | ICD-10-CM

## 2014-01-18 DIAGNOSIS — S99929A Unspecified injury of unspecified foot, initial encounter: Secondary | ICD-10-CM

## 2014-01-18 DIAGNOSIS — S99919A Unspecified injury of unspecified ankle, initial encounter: Secondary | ICD-10-CM

## 2014-01-23 ENCOUNTER — Encounter (INDEPENDENT_AMBULATORY_CARE_PROVIDER_SITE_OTHER): Payer: BC Managed Care – PPO | Admitting: Physical Therapy

## 2014-01-23 DIAGNOSIS — R609 Edema, unspecified: Secondary | ICD-10-CM

## 2014-01-23 DIAGNOSIS — S99919A Unspecified injury of unspecified ankle, initial encounter: Secondary | ICD-10-CM

## 2014-01-23 DIAGNOSIS — M25569 Pain in unspecified knee: Secondary | ICD-10-CM

## 2014-01-23 DIAGNOSIS — S8990XA Unspecified injury of unspecified lower leg, initial encounter: Secondary | ICD-10-CM

## 2014-01-23 DIAGNOSIS — S99929A Unspecified injury of unspecified foot, initial encounter: Secondary | ICD-10-CM

## 2014-01-23 DIAGNOSIS — M6281 Muscle weakness (generalized): Secondary | ICD-10-CM

## 2014-01-25 ENCOUNTER — Encounter (INDEPENDENT_AMBULATORY_CARE_PROVIDER_SITE_OTHER): Payer: BC Managed Care – PPO | Admitting: Physical Therapy

## 2014-01-25 DIAGNOSIS — S99929A Unspecified injury of unspecified foot, initial encounter: Secondary | ICD-10-CM

## 2014-01-25 DIAGNOSIS — M25669 Stiffness of unspecified knee, not elsewhere classified: Secondary | ICD-10-CM

## 2014-01-25 DIAGNOSIS — R609 Edema, unspecified: Secondary | ICD-10-CM

## 2014-01-25 DIAGNOSIS — M25569 Pain in unspecified knee: Secondary | ICD-10-CM

## 2014-01-25 DIAGNOSIS — S99919A Unspecified injury of unspecified ankle, initial encounter: Secondary | ICD-10-CM

## 2014-01-25 DIAGNOSIS — S8990XA Unspecified injury of unspecified lower leg, initial encounter: Secondary | ICD-10-CM

## 2014-01-25 DIAGNOSIS — M6281 Muscle weakness (generalized): Secondary | ICD-10-CM

## 2014-01-25 DIAGNOSIS — R269 Unspecified abnormalities of gait and mobility: Secondary | ICD-10-CM

## 2014-02-01 ENCOUNTER — Encounter (INDEPENDENT_AMBULATORY_CARE_PROVIDER_SITE_OTHER): Payer: BC Managed Care – PPO | Admitting: Physical Therapy

## 2014-02-01 DIAGNOSIS — S99929A Unspecified injury of unspecified foot, initial encounter: Secondary | ICD-10-CM

## 2014-02-01 DIAGNOSIS — S99919A Unspecified injury of unspecified ankle, initial encounter: Secondary | ICD-10-CM

## 2014-02-01 DIAGNOSIS — M25669 Stiffness of unspecified knee, not elsewhere classified: Secondary | ICD-10-CM

## 2014-02-01 DIAGNOSIS — R609 Edema, unspecified: Secondary | ICD-10-CM

## 2014-02-01 DIAGNOSIS — S8990XA Unspecified injury of unspecified lower leg, initial encounter: Secondary | ICD-10-CM

## 2014-02-01 DIAGNOSIS — M25569 Pain in unspecified knee: Secondary | ICD-10-CM

## 2014-02-01 DIAGNOSIS — M6281 Muscle weakness (generalized): Secondary | ICD-10-CM

## 2014-02-01 DIAGNOSIS — R269 Unspecified abnormalities of gait and mobility: Secondary | ICD-10-CM

## 2014-02-03 ENCOUNTER — Ambulatory Visit (INDEPENDENT_AMBULATORY_CARE_PROVIDER_SITE_OTHER): Payer: BLUE CROSS/BLUE SHIELD | Admitting: Sports Medicine

## 2014-02-03 ENCOUNTER — Encounter: Payer: Self-pay | Admitting: Sports Medicine

## 2014-02-03 VITALS — BP 128/85 | HR 70 | Ht 69.5 in | Wt 191.0 lb

## 2014-02-03 DIAGNOSIS — M25562 Pain in left knee: Secondary | ICD-10-CM | POA: Insufficient documentation

## 2014-02-03 DIAGNOSIS — M25561 Pain in right knee: Secondary | ICD-10-CM | POA: Diagnosis not present

## 2014-02-03 NOTE — Assessment & Plan Note (Signed)
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 Patient has failed physical therapy, NSAIDs. The knee was injected today under ultrasound guidance. Continue PT, return in 4 weeks. MRI if no better.

## 2014-02-03 NOTE — Progress Notes (Signed)
   Subjective:    I'm seeing this patient as a consultation for:  Dr. Linford ArnoldMetheney  CC: Left knee pain  HPI: This is a very pleasant 58 year old male, for the past several months his back pain he localizes in the medial joint line of his left knee without swelling or mechanical symptoms. X-rays were essentially negative. He has now been taking NSAIDs, and has been through formal physical therapy for a solid month, and continues to have pain. It is moderate, persistent without radiation.  Past medical history, Surgical history, Family history not pertinant except as noted below, Social history, Allergies, and medications have been entered into the medical record, reviewed, and no changes needed.   Review of Systems: No headache, visual changes, nausea, vomiting, diarrhea, constipation, dizziness, abdominal pain, skin rash, fevers, chills, night sweats, weight loss, swollen lymph nodes, body aches, joint swelling, muscle aches, chest pain, shortness of breath, mood changes, visual or auditory hallucinations.   Objective:   General: Well Developed, well nourished, and in no acute distress.  Neuro/Psych: Alert and oriented x3, extra-ocular muscles intact, able to move all 4 extremities, sensation grossly intact. Skin: Warm and dry, no rashes noted.  Respiratory: Not using accessory muscles, speaking in full sentences, trachea midline.  Cardiovascular: Pulses palpable, no extremity edema. Abdomen: Does not appear distended. Left Knee: Normal to inspection with no erythema or effusion or obvious bony abnormalities. Tender to palpation at the medial joint line ROM normal in flexion and extension and lower leg rotation. Ligaments with solid consistent endpoints including ACL, PCL, LCL, MCL. Negative Mcmurray's and provocative meniscal tests. Non painful patellar compression. Patellar and quadriceps tendons unremarkable. Hamstring and quadriceps strength is normal.  Procedure: Real-time Ultrasound  Guided Injection of left knee Device: GE Logiq E  Verbal informed consent obtained.  Time-out conducted.  Noted no overlying erythema, induration, or other signs of local infection.  Skin prepped in a sterile fashion.  Local anesthesia: Topical Ethyl chloride.  With sterile technique and under real time ultrasound guidance:  2 cc kenalog 40, 4 cc lidocaine injected easily into the suprapatellar recess. Completed without difficulty  Pain immediately resolved suggesting accurate placement of the medication.  Advised to call if fevers/chills, erythema, induration, drainage, or persistent bleeding.  Images permanently stored and available for review in the ultrasound unit.  Impression: Technically successful ultrasound guided injection.  Impression and Recommendations:   This case required medical decision making of moderate complexity.

## 2014-02-13 ENCOUNTER — Encounter (INDEPENDENT_AMBULATORY_CARE_PROVIDER_SITE_OTHER): Payer: BC Managed Care – PPO | Admitting: Physical Therapy

## 2014-02-13 DIAGNOSIS — M25569 Pain in unspecified knee: Secondary | ICD-10-CM

## 2014-02-13 DIAGNOSIS — M25562 Pain in left knee: Secondary | ICD-10-CM

## 2014-02-13 DIAGNOSIS — M6281 Muscle weakness (generalized): Secondary | ICD-10-CM

## 2014-02-13 DIAGNOSIS — R2689 Other abnormalities of gait and mobility: Secondary | ICD-10-CM

## 2014-02-20 ENCOUNTER — Encounter (INDEPENDENT_AMBULATORY_CARE_PROVIDER_SITE_OTHER): Payer: BC Managed Care – PPO | Admitting: Physical Therapy

## 2014-02-20 DIAGNOSIS — R2689 Other abnormalities of gait and mobility: Secondary | ICD-10-CM

## 2014-02-20 DIAGNOSIS — M25569 Pain in unspecified knee: Secondary | ICD-10-CM

## 2014-02-20 DIAGNOSIS — M6281 Muscle weakness (generalized): Secondary | ICD-10-CM

## 2014-02-27 ENCOUNTER — Encounter: Payer: BC Managed Care – PPO | Admitting: Physical Therapy

## 2014-03-06 ENCOUNTER — Encounter: Payer: BC Managed Care – PPO | Admitting: Physical Therapy

## 2014-03-09 ENCOUNTER — Ambulatory Visit (INDEPENDENT_AMBULATORY_CARE_PROVIDER_SITE_OTHER): Payer: BLUE CROSS/BLUE SHIELD | Admitting: Sports Medicine

## 2014-03-09 ENCOUNTER — Encounter: Payer: Self-pay | Admitting: Sports Medicine

## 2014-03-09 DIAGNOSIS — M25562 Pain in left knee: Secondary | ICD-10-CM | POA: Diagnosis not present

## 2014-03-09 MED ORDER — MELOXICAM 15 MG PO TABS: ORAL_TABLET | ORAL | Status: DC

## 2014-03-09 NOTE — Progress Notes (Signed)
  Subjective:    CC: follow-up  HPI: Knee osteoarthritis: Resolved with injection.  Past medical history, Surgical history, Family history not pertinant except as noted below, Social history, Allergies, and medications have been entered into the medical record, reviewed, and no changes needed.   Review of Systems: No fevers, chills, night sweats, weight loss, chest pain, or shortness of breath.   Objective:    General: Well Developed, well nourished, and in no acute distress.  Neuro: Alert and oriented x3, extra-ocular muscles intact, sensation grossly intact.  HEENT: Normocephalic, atraumatic, pupils equal round reactive to light, neck supple, no masses, no lymphadenopathy, thyroid nonpalpable.  Skin: Warm and dry, no rashes. Cardiac: Regular rate and rhythm, no murmurs rubs or gallops, no lower extremity edema.  Respiratory: Clear to auscultation bilaterally. Not using accessory muscles, speaking in full sentences. Left Knee: Normal to inspection with no erythema or effusion or obvious bony abnormalities. Palpation normal with no warmth or joint line tenderness or patellar tenderness or condyle tenderness. ROM normal in flexion and extension and lower leg rotation. Ligaments with solid consistent endpoints including ACL, PCL, LCL, MCL. Negative Mcmurray's and provocative meniscal tests. Non painful patellar compression. Patellar and quadriceps tendons unremarkable. Hamstring and quadriceps strength is normal.  Impression and Recommendations:

## 2014-03-09 NOTE — Assessment & Plan Note (Signed)
Pain is resolved after injection. Return as needed.  Meloxicam as needed, be active. Viscous supplementation if no better or pain returns before 3 months.

## 2014-04-17 ENCOUNTER — Emergency Department
Admission: EM | Admit: 2014-04-17 | Discharge: 2014-04-17 | Disposition: A | Discharge status: Home / Self Care | Payer: BC Managed Care – PPO | Source: Home / Self Care | Attending: Family Medicine | Admitting: Family Medicine

## 2014-04-17 ENCOUNTER — Encounter: Payer: Self-pay | Admitting: *Deleted

## 2014-04-17 DIAGNOSIS — J069 Acute upper respiratory infection, unspecified: Secondary | ICD-10-CM

## 2014-04-17 DIAGNOSIS — B9789 Other viral agents as the cause of diseases classified elsewhere: Principal | ICD-10-CM

## 2014-04-17 MED ORDER — AZITHROMYCIN 250 MG PO TABS: ORAL_TABLET | ORAL | Status: DC

## 2014-04-17 MED ORDER — BENZONATATE 200 MG PO CAPS: 200.0000 mg | ORAL_CAPSULE | Freq: Every day | ORAL | Status: DC

## 2014-04-17 NOTE — ED Provider Notes (Signed)
CSN: [id number]     Arrival date & time 04/17/14  1552 History   First MD Initiated Contact with Patient 04/17/14 1612     Chief Complaint  Patient presents with  . Cough  . Nasal Congestion      HPI Comments: Patient developed a mild sore throat one week ago which was followed by nasal congestion, sneezing, myalgias, and fatigue.  About 5 days ago he developed a partly productive cough, worse at night. He has a past history of multiple episodes of otitis media as a child.  The history is provided by the patient.    Past Medical History  Diagnosis Date  . Hypertension    Past Surgical History  Procedure Laterality Date  . Tonsillectomy    . Inner ear surgery     Family History  Problem Relation Age of Onset  . Heart failure Mother   . Heart attack Sister 1148    smoker    . Heart disease Mother     triple bypass    History  Substance Use Topics  . Smoking status: Never Smoker   . Smokeless tobacco: Never Used  . Alcohol Use: No    Review of Systems + sore throat + cough No pleuritic pain No wheezing + nasal congestion + post-nasal drainage No sinus pain/pressure No itchy/red eyes No earache No hemoptysis No SOB + fever, + chills No nausea No vomiting No abdominal pain No diarrhea No urinary symptoms No skin rash + fatigue + myalgias + headache Used OTC meds without relief  Allergies  Amoxicillin  Home Medications   Prior to Admission medications   Medication Sig Start Date End Date Taking? Authorizing Provider  aspirin 81 MG tablet Take 81 mg by mouth daily.      Historical Provider, MD  azithromycin (ZITHROMAX Z-PAK) 250 MG tablet Take 2 tabs today; then begin one tab once daily for 4 more days. (Rx void after 04/25/14) 04/17/14   Lattie HawStephen A Beese, MD  benzonatate (TESSALON) 200 MG capsule Take 1 capsule (200 mg total) by mouth at bedtime. Take as needed for cough 04/17/14   Lattie HawStephen A Beese, MD  Calcium Carb-Cholecalciferol 600-800 MG-UNIT TABS  Take by mouth.    Historical Provider, MD  CINNAMON PO Take by mouth.    Historical Provider, MD  Ginkgo Biloba 40 MG TABS Take 60 mg by mouth.    Historical Provider, MD  lisinopril (PRINIVIL,ZESTRIL) 20 MG tablet TAKE ONE TABLET BY MOUTH ONCE DAILY 01/17/14   Agapito Gamesatherine D Metheney, MD  meloxicam (MOBIC) 15 MG tablet One tab PO qAM with breakfast for 2 weeks, then daily prn pain. 03/09/14   Monica Bectonhomas J Thekkekandam, MD  Multiple Vitamin (MULTIVITAMIN) capsule Take 1 capsule by mouth daily.      Historical Provider, MD  Multiple Vitamins-Minerals (OCUVITE PO) Take by mouth.    Historical Provider, MD  polycarbophil (FIBERCON) 625 MG tablet Take 625 mg by mouth daily.    Historical Provider, MD  ranitidine (ZANTAC) 150 MG tablet Take 150 mg by mouth 2 (two) times daily.      Historical Provider, MD  vitamin C (ASCORBIC ACID) 500 MG tablet Take 500 mg by mouth daily.    Historical Provider, MD   BP 126/85 mmHg  Pulse 102  Temp(Src) 98.2 F (36.8 C) (Oral)  Resp 14  Wt 190 lb (86.183 kg)  SpO2 97% Physical Exam Nursing notes and Vital Signs reviewed. Appearance:  Patient appears healthy, stated age, and in no acute  distress Eyes:  Pupils are equal, round, and reactive to light and accomodation.  Extraocular movement is intact.  Conjunctivae are not inflamed  Ears:  Canals normal.  Tympanic membranes are scarred bilaterally, worse on the left but no acute process present.   Nose:  Mildly congested turbinates.  No sinus tenderness.  Pharynx:  Normal Neck:  Supple.   Tender enlarged posterior nodes are palpated bilaterally  Lungs:  Clear to auscultation.  Breath sounds are equal.  Heart:  Regular rate and rhythm without murmurs, rubs, or gallops.  Abdomen:  Nontender without masses or hepatosplenomegaly.  Bowel sounds are present.  No CVA or flank tenderness.  Extremities:  No edema.  No calf tenderness Skin:  No rash present.   ED Course  Procedures  none     MDM   1. Viral URI with cough     There is no evidence of bacterial infection today.  Treat symptomatically for now  Prescription written for Benzonatate (Tessalon) to take at bedtime for night-time cough.  Take plain Mucinex (1200 mg guaifenesin) twice daily for cough and congestion.  Increase fluid intake, rest. May use Afrin nasal spray (or generic oxymetazoline) twice daily for about 5 days.  Also recommend using saline nasal spray several times daily and saline nasal irrigation (AYR is a common brand) Try warm salt water gargles for sore throat.  Stop all antihistamines for now, and other non-prescription cough/cold preparations.. Begin Azithromycin if not improving about five days or if persistent fever develops (Given a prescription to hold, with an expiration date)  Follow-up with family doctor if not improving about one week.     Lattie HawStephen A Beese, MD 04/17/14 [phone]

## 2014-04-17 NOTE — ED Notes (Signed)
Lee Taylor c/o sore throat, chills, productive cough, fatigue and congestion started 1 week ago. All has resolved except for productive cough and congestion. Taken Mucinex, cough drops and aleve.

## 2014-04-17 NOTE — Discharge Instructions (Signed)
Take plain Mucinex (1200 mg guaifenesin) twice daily for cough and congestion.  Increase fluid intake, rest. May use Afrin nasal spray (or generic oxymetazoline) twice daily for about 5 days.  Also recommend using saline nasal spray several times daily and saline nasal irrigation (AYR is a common brand) Try warm salt water gargles for sore throat.  Stop all antihistamines for now, and other non-prescription cough/cold preparations.. Begin Azithromycin if not improving about five days or if persistent fever develops   Follow-up with family doctor if not improving about one week.

## 2014-04-20 ENCOUNTER — Other Ambulatory Visit: Payer: Self-pay | Admitting: Family Medicine

## 2014-05-17 ENCOUNTER — Other Ambulatory Visit: Payer: Self-pay | Admitting: Family Medicine

## 2014-05-18 ENCOUNTER — Other Ambulatory Visit: Payer: Self-pay | Admitting: *Deleted

## 2014-05-18 MED ORDER — LISINOPRIL 20 MG PO TABS: ORAL_TABLET | ORAL | Status: DC

## 2014-05-25 ENCOUNTER — Ambulatory Visit: Payer: Self-pay | Admitting: Family Medicine

## 2014-05-28 ENCOUNTER — Emergency Department
Admission: EM | Admit: 2014-05-28 | Discharge: 2014-05-28 | Disposition: A | Discharge status: Home / Self Care | Payer: BLUE CROSS/BLUE SHIELD | Source: Home / Self Care | Attending: Emergency Medicine | Admitting: Emergency Medicine

## 2014-05-28 DIAGNOSIS — J40 Bronchitis, not specified as acute or chronic: Secondary | ICD-10-CM

## 2014-05-28 MED ORDER — BENZONATATE 200 MG PO CAPS: 200.0000 mg | ORAL_CAPSULE | Freq: Every day | ORAL | Status: DC

## 2014-05-28 MED ORDER — AZITHROMYCIN 250 MG PO TABS: ORAL_TABLET | ORAL | Status: DC

## 2014-05-28 NOTE — ED Notes (Signed)
Patient c/o cough and cold sx for approx one week

## 2014-05-28 NOTE — ED Provider Notes (Signed)
CSN: [id number]     Arrival date & time 05/28/14  1158 History   First MD Initiated Contact with Patient 05/28/14 1257     Chief Complaint  Patient presents with  . Cough   (Consider location/radiation/quality/duration/timing/severity/associated sxs/prior Treatment) Patient is a 59 y.o. male presenting with cough. The history is provided by the patient. No language interpreter was used.  Cough Cough characteristics:  Productive Sputum characteristics:  Nondescript Severity:  Moderate Onset quality:  Gradual Duration:  1 week Timing:  Constant Chronicity:  New Smoker: no   Relieved by:  Nothing Worsened by:  Nothing tried Ineffective treatments:  None tried Associated symptoms: fever   Associated symptoms: no chest pain and no shortness of breath     Past Medical History  Diagnosis Date  . Hypertension    Past Surgical History  Procedure Laterality Date  . Tonsillectomy    . Inner ear surgery     Family History  Problem Relation Age of Onset  . Heart failure Mother   . Heart attack Sister 11    smoker    . Heart disease Mother     triple bypass    History  Substance Use Topics  . Smoking status: Never Smoker   . Smokeless tobacco: Never Used  . Alcohol Use: No    Review of Systems  Constitutional: Positive for fever.  Respiratory: Positive for cough. Negative for shortness of breath.   Cardiovascular: Negative for chest pain.  All other systems reviewed and are negative.   Allergies  Amoxicillin  Home Medications   Prior to Admission medications   Medication Sig Start Date End Date Taking? Authorizing Provider  aspirin 81 MG tablet Take 81 mg by mouth daily.      Historical Provider, MD  azithromycin (ZITHROMAX Z-PAK) 250 MG tablet Take 2 tabs today; then begin one tab once daily for 4 more days. (Rx void after 04/25/14) 05/28/14   Elson Areas, PA-C  benzonatate (TESSALON) 200 MG capsule Take 1 capsule (200 mg total) by mouth at bedtime. Take as  needed for cough 05/28/14   Elson Areas, PA-C  Calcium Carb-Cholecalciferol 600-800 MG-UNIT TABS Take by mouth.    Historical Provider, MD  CINNAMON PO Take by mouth.    Historical Provider, MD  Ginkgo Biloba 40 MG TABS Take 60 mg by mouth.    Historical Provider, MD  lisinopril (PRINIVIL,ZESTRIL) 20 MG tablet TAKE ONE TABLET BY MOUTH ONCE DAILY. 05/18/14   Agapito Games, MD  meloxicam (MOBIC) 15 MG tablet One tab PO qAM with breakfast for 2 weeks, then daily prn pain. 03/09/14   Monica Becton, MD  Multiple Vitamin (MULTIVITAMIN) capsule Take 1 capsule by mouth daily.      Historical Provider, MD  Multiple Vitamins-Minerals (OCUVITE PO) Take by mouth.    Historical Provider, MD  polycarbophil (FIBERCON) 625 MG tablet Take 625 mg by mouth daily.    Historical Provider, MD  ranitidine (ZANTAC) 150 MG tablet Take 150 mg by mouth 2 (two) times daily.      Historical Provider, MD  vitamin C (ASCORBIC ACID) 500 MG tablet Take 500 mg by mouth daily.    Historical Provider, MD   BP 102/68 mmHg  Pulse 80  Temp(Src) 98.5 F (36.9 C) (Oral)  Ht  (1.753 m)  Wt 190 lb (86.183 kg)  BMI 28.05 kg/m2  SpO2 96% Physical Exam  Constitutional: He is oriented to person, place, and time. He appears well-developed and well-nourished.  HENT:  Head: Normocephalic.  Right Ear: External ear normal.  Left Ear: External ear normal.  Nose: Nose normal.  Mouth/Throat: Oropharynx is clear and moist.  Eyes: EOM are normal. Pupils are equal, round, and reactive to light.  Neck: Normal range of motion.  Cardiovascular: Normal rate.   Pulmonary/Chest: Effort normal.  Abdominal: Soft. He exhibits no distension.  Musculoskeletal: Normal range of motion.  Neurological: He is alert and oriented to person, place, and time.  Skin: Skin is warm.  Psychiatric: He has a normal mood and affect.  Nursing note and vitals reviewed.   ED Course  Procedures (including critical care time) Labs Review Labs  Reviewed - No data to display  Imaging Review No results found.   MDM   1. Bronchitis    zithromax Tessalon See Dr. Linford Arnoldmetheney for recheck in 1 week if symptoms persist    Elson AreasLeslie K Miguelina Fore, [place] JerseyPA-C 05/28/14 1514

## 2014-05-28 NOTE — Discharge Instructions (Signed)

## 2014-05-29 ENCOUNTER — Telehealth: Payer: Self-pay | Admitting: *Deleted

## 2014-05-30 ENCOUNTER — Encounter: Payer: Self-pay | Admitting: Family Medicine

## 2014-05-30 ENCOUNTER — Ambulatory Visit (INDEPENDENT_AMBULATORY_CARE_PROVIDER_SITE_OTHER): Payer: BLUE CROSS/BLUE SHIELD | Admitting: Family Medicine

## 2014-05-30 VITALS — BP 130/84 | HR 84 | Wt 187.0 lb

## 2014-05-30 DIAGNOSIS — I10 Essential (primary) hypertension: Secondary | ICD-10-CM

## 2014-05-30 DIAGNOSIS — J209 Acute bronchitis, unspecified: Secondary | ICD-10-CM | POA: Diagnosis not present

## 2014-05-30 LAB — BASIC METABOLIC PANEL WITH GFR
BUN: 16 mg/dL (ref 6–23)
CHLORIDE: 104 meq/L (ref 96–112)
CO2: 28 mEq/L (ref 19–32)
CREATININE: 1.16 mg/dL (ref 0.50–1.35)
Calcium: 9.1 mg/dL (ref 8.4–10.5)
GFR, Est African American: 80 mL/min
GFR, Est Non African American: 69 mL/min
Glucose, Bld: 90 mg/dL (ref 70–99)
POTASSIUM: 4.5 meq/L (ref 3.5–5.3)
Sodium: 140 mEq/L (ref 135–145)

## 2014-05-30 MED ORDER — PREDNISONE 50 MG PO TABS: ORAL_TABLET | ORAL | Status: DC

## 2014-05-30 MED ORDER — HYDROCODONE-HOMATROPINE 5-1.5 MG/5ML PO SYRP: 5.0000 mL | ORAL_SOLUTION | Freq: Three times a day (TID) | ORAL | Status: DC | PRN

## 2014-05-30 NOTE — Progress Notes (Signed)
CC: Lee Taylor is a 59 y.o. male is here for Cough   Subjective: HPI:   complaints of cough that has been present for about  A month now on a daily basis. Fluctuates from mild to moderate in severity. Improved initially  With a Z-Pak  However returned  3 or 4 days ago and he started a Z-Pak 2 days ago and has had no real improvement. He describes as nonproductive with a sense of heavy congestion in the chest. He cannot bring up any mucus with coughing but it comes up in  Large chunks if he sneezes. His biggest complaint is that the cough is worse at night and keeping him awake at night causing him to miss work.  No benefit from [employer]. Denies fevers, chills,, shortness of breath, chest pain nor nasal congestion. Review systems positive for wheezing   He wants know if he can have refills on lisinopril.  No outside blood pressures report. Tries to stay active but no formal exercise routine. No chest pain, limb claudication, motor or sensory disturbances, orthopnea nor peripheral edema.  Review Of Systems Outlined In HPI  Past Medical History  Diagnosis Date  . Hypertension     Past Surgical History  Procedure Laterality Date  . Tonsillectomy    . Inner ear surgery     Family History  Problem Relation Age of Onset  . Heart failure Mother   . Heart attack Sister 65    smoker    . Heart disease Mother     triple bypass     History   Social History  . Marital Status: Single    Spouse Name: Lee Taylor     Number of Children: 2  . Years of Education: N/A   Occupational History  . Museum/gallery exhibitions officer    Social History Main Topics  . Smoking status: Never Smoker   . Smokeless tobacco: Never Used  . Alcohol Use: No  . Drug Use: No  . Sexual Activity:    Partners: Female   Other Topics Concern  . Not on file   Social History Narrative     Objective: BP 130/84 mmHg  Pulse 84  Wt 187 lb (84.823 kg)  SpO2 98%  General: Alert and Oriented, No Acute  Distress HEENT: Pupils equal, round, reactive to light. Conjunctivae clear.  External ears unremarkable, canals clear with intact TMs with appropriate landmarks.  Middle ear appears open without effusion. Pink inferior turbinates.  Moist mucous membranes, pharynx without inflammation nor lesions.  Neck supple without palpable lymphadenopathy nor abnormal masses. Lungs: Clear to auscultation bilaterally, no wheezing/ronchi/rales.  Comfortable work of breathing. Good air movement. Cardiac: Regular rate and rhythm. Normal S1/S2.  No murmurs, rubs, nor gallops.   Extremities: No peripheral edema.  Strong peripheral pulses.  Mental Status: No depression, anxiety, nor agitation. Skin: Warm and dry.  Assessment & Plan: Lee Taylor was seen today for cough.  Diagnoses and associated orders for this visit:  Acute bronchitis, unspecified organism - predniSONE (DELTASONE) 50 MG tablet; One by mouth daily for 5 days. - HYDROcodone-homatropine (HYCODAN) 5-1.5 MG/5ML syrup; Take 5 mLs by mouth every 8 (eight) hours as needed for cough.  Essential hypertension, benign - BASIC METABOLIC PANEL WITH GFR     acute bronchitis:  Discussed symptomatically treated with Hycodan that should help him with sleeping at night  Single back to work tomorrow. Discussed  Sedation warning.   He may as well stop azithromycin I don't think that there is any  bacterial involvement  To his cough, may begin prednisone  Given the element of wheezing outside of our office.  Obtaining asymmetric metabolic panel and if renal function is unchanged from April of last year will provide with lisinopril refills. Blood pressure appears to be controlled.  Return if symptoms worsen or fail to improve.

## 2014-05-31 ENCOUNTER — Telehealth: Payer: Self-pay | Admitting: Family Medicine

## 2014-05-31 MED ORDER — LISINOPRIL 20 MG PO TABS: ORAL_TABLET | ORAL | Status: DC

## 2014-05-31 NOTE — Telephone Encounter (Signed)
Spouse notified

## 2014-05-31 NOTE — Telephone Encounter (Signed)
Lee Taylor Will you please let patient know that his kidney function is stable therefore I've sent refills of lisinopril to his [facility] pharmacy.

## 2014-06-01 ENCOUNTER — Ambulatory Visit: Payer: BLUE CROSS/BLUE SHIELD | Admitting: Family Medicine

## 2014-06-02 ENCOUNTER — Ambulatory Visit: Payer: Self-pay | Admitting: Family Medicine

## 2014-07-20 ENCOUNTER — Ambulatory Visit (INDEPENDENT_AMBULATORY_CARE_PROVIDER_SITE_OTHER): Payer: BLUE CROSS/BLUE SHIELD

## 2014-07-20 ENCOUNTER — Encounter: Payer: Self-pay | Admitting: Sports Medicine

## 2014-07-20 ENCOUNTER — Ambulatory Visit (INDEPENDENT_AMBULATORY_CARE_PROVIDER_SITE_OTHER): Payer: BLUE CROSS/BLUE SHIELD | Admitting: Sports Medicine

## 2014-07-20 VITALS — BP 120/80 | HR 86 | Ht 69.5 in | Wt 185.0 lb

## 2014-07-20 DIAGNOSIS — M79672 Pain in left foot: Secondary | ICD-10-CM | POA: Diagnosis not present

## 2014-07-20 DIAGNOSIS — M25572 Pain in left ankle and joints of left foot: Secondary | ICD-10-CM

## 2014-07-20 NOTE — Assessment & Plan Note (Signed)
Pain over the base of the fifth metatarsal, directly over the bone, without peritoneal signs. Likely stress injury. Postop shoe, x-rays, lateral heel wedge in the left shoe and at work, because he has to wear a steel toed boot. Call me in 2 weeks and if no better we will order an MRI of the foot. Otherwise I would like to see him back in 4 weeks.

## 2014-07-20 NOTE — Progress Notes (Signed)
   Subjective:    I'm seeing this patient as a consultation for:  Dr. Nani Gasseratherine Metheney  CC:  Left foot pain  HPI:  Patient presents with complaint of 2 months of intermittent sharp pain at the lateral aspect of his left foot. The pain is 7/10 in severity, does not radiate, worsened with use and standing, and improved with rest. The patient does not recall any trauma to the area, swelling or bruising, numbness, weakness. He denies any recent increase in activity or new footwear.   Past medical history, Surgical history, Family history not pertinant except as noted below, Social history, Allergies, and medications have been entered into the medical record, reviewed, and no changes needed.   Review of Systems: No headache, visual changes, nausea, vomiting, diarrhea, constipation, dizziness, abdominal pain, skin rash, fevers, chills, night sweats, weight loss, swollen lymph nodes, body aches, joint swelling, muscle aches, chest pain, shortness of breath, mood changes, visual or auditory hallucinations.   Objective:   General: Well Developed, well nourished, and in no acute distress.  Neuro/Psych: Alert and oriented x3, extra-ocular muscles intact, able to move all 4 extremities, sensation grossly intact. Skin: Warm and dry, no rashes noted.  Respiratory: Not using accessory muscles, speaking in full sentences, trachea midline.  Cardiovascular: Pulses palpable, no extremity edema. Abdomen: Does not appear distended. MSK: Left Foot: No visible erythema or swelling. Range of motion is full in all directions. Strength is 5/5 in all directions, mild pain elicited with eversion. No hallux valgus. No pes cavus or pes planus. No abnormal callus noted. No pain over the navicular prominence. There is pain over the shaft and base of fifth metatarsal. No tenderness over peroneus brevis tendon. No tenderness to palpation of the calcaneal insertion of plantar fascia. No pain at the Achilles  insertion. No pain over the calcaneal bursa. No pain of the retrocalcaneal bursa. No tenderness to palpation over the tarsals or phalanges. There is tenderness with palpation of the shaft and base of the fifth metatarsal. No other metatarsal tenderness. No hallux rigidus or limitus. No tenderness palpation over interphalangeal joints. No pain with compression of the metatarsal heads. Neurovascularly intact distally.  Foot x-ray does not show any acute abnormality.  Impression and Recommendations:   This case required medical decision making of moderate complexity.  # Left foot pain - Patient without known history of trauma, suspect stress fracture. - Will obtain XR left foot today - Patient was given a post-op boot for immobilization, and a felt lateral heel wedge for his steel-toe work shoes - Patient may take OTC pain medications as needed for pain  Follow up in 4 weeks or sooner as needed

## 2014-08-17 ENCOUNTER — Encounter: Payer: Self-pay | Admitting: Sports Medicine

## 2014-08-17 ENCOUNTER — Ambulatory Visit (INDEPENDENT_AMBULATORY_CARE_PROVIDER_SITE_OTHER): Payer: BLUE CROSS/BLUE SHIELD | Admitting: Sports Medicine

## 2014-08-17 VITALS — BP 113/80 | HR 80 | Ht 70.0 in | Wt 190.0 lb

## 2014-08-17 DIAGNOSIS — M79672 Pain in left foot: Secondary | ICD-10-CM | POA: Diagnosis not present

## 2014-08-17 NOTE — Progress Notes (Signed)
  Subjective:    CC: Follow-up  HPI: Lee Taylor returns, he had what appeared to be a fifth metatarsal stress fracture, we treated him with a lateral wedge, and he returns today feeling significantly better, he only has minimal pain at this point. Symptoms are mild, improving.  Past medical history, Surgical history, Family history not pertinant except as noted below, Social history, Allergies, and medications have been entered into the medical record, reviewed, and no changes needed.   Review of Systems: No fevers, chills, night sweats, weight loss, chest pain, or shortness of breath.   Objective:    General: Well Developed, well nourished, and in no acute distress.  Neuro: Alert and oriented x3, extra-ocular muscles intact, sensation grossly intact.  HEENT: Normocephalic, atraumatic, pupils equal round reactive to light, neck supple, no masses, no lymphadenopathy, thyroid nonpalpable.  Skin: Warm and dry, no rashes. Cardiac: Regular rate and rhythm, no murmurs rubs or gallops, no lower extremity edema.  Respiratory: Clear to auscultation bilaterally. Not using accessory muscles, speaking in full sentences. Left Foot: No visible erythema or swelling. Range of motion is full in all directions. Strength is 5/5 in all directions. No hallux valgus. No pes cavus or pes planus. No abnormal callus noted. No pain over the navicular prominence, or base of fifth metatarsal. No tenderness to palpation of the calcaneal insertion of plantar fascia. No pain at the Achilles insertion. No pain over the calcaneal bursa. No pain of the retrocalcaneal bursa. Only minimal tenderness over the mid shaft of the dorsal fifth metatarsal No hallux rigidus or limitus. No tenderness palpation over interphalangeal joints. No pain with compression of the metatarsal heads. Neurovascularly intact distally.  Impression and Recommendations:

## 2014-08-17 NOTE — Assessment & Plan Note (Signed)
Was directly over the dorsum of the fifth metatarsal midshaft, likely represent to the stress injury. Is approximately 70% improved with the lateral heel wedge. Return for custom orthotics, continue lateral wedge.

## 2014-09-15 ENCOUNTER — Ambulatory Visit (INDEPENDENT_AMBULATORY_CARE_PROVIDER_SITE_OTHER): Payer: BLUE CROSS/BLUE SHIELD | Admitting: Sports Medicine

## 2014-09-15 ENCOUNTER — Encounter: Payer: Self-pay | Admitting: Sports Medicine

## 2014-09-15 VITALS — BP 129/80 | HR 71 | Wt 191.0 lb

## 2014-09-15 DIAGNOSIS — M79672 Pain in left foot: Secondary | ICD-10-CM | POA: Diagnosis not present

## 2014-09-15 NOTE — Assessment & Plan Note (Signed)
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 Return in 1 month.

## 2014-09-15 NOTE — Progress Notes (Signed)

## 2014-10-04 DIAGNOSIS — I2510 Atherosclerotic heart disease of native coronary artery without angina pectoris: Secondary | ICD-10-CM

## 2014-10-04 HISTORY — DX: Atherosclerotic heart disease of native coronary artery without angina pectoris: I25.10

## 2014-10-19 ENCOUNTER — Ambulatory Visit: Payer: BLUE CROSS/BLUE SHIELD | Admitting: Sports Medicine

## 2014-10-20 ENCOUNTER — Encounter (HOSPITAL_BASED_OUTPATIENT_CLINIC_OR_DEPARTMENT_OTHER): Payer: Self-pay | Admitting: *Deleted

## 2014-10-20 ENCOUNTER — Other Ambulatory Visit: Payer: Self-pay

## 2014-10-20 ENCOUNTER — Inpatient Hospital Stay (HOSPITAL_BASED_OUTPATIENT_CLINIC_OR_DEPARTMENT_OTHER)
Admission: EM | Admit: 2014-10-20 | Discharge: 2014-10-24 | DRG: 247 | Disposition: A | Discharge status: Home / Self Care | Payer: BLUE CROSS/BLUE SHIELD | Attending: Cardiology | Admitting: Cardiology

## 2014-10-20 ENCOUNTER — Telehealth: Payer: Self-pay | Admitting: Internal Medicine

## 2014-10-20 ENCOUNTER — Emergency Department (INDEPENDENT_AMBULATORY_CARE_PROVIDER_SITE_OTHER)
Admission: EM | Admit: 2014-10-20 | Discharge: 2014-10-20 | Disposition: A | Discharge status: Home / Self Care | Payer: BLUE CROSS/BLUE SHIELD | Source: Home / Self Care | Attending: Family Medicine | Admitting: Family Medicine

## 2014-10-20 ENCOUNTER — Encounter: Payer: Self-pay | Admitting: Emergency Medicine

## 2014-10-20 ENCOUNTER — Emergency Department (INDEPENDENT_AMBULATORY_CARE_PROVIDER_SITE_OTHER): Payer: BLUE CROSS/BLUE SHIELD

## 2014-10-20 DIAGNOSIS — N179 Acute kidney failure, unspecified: Secondary | ICD-10-CM | POA: Diagnosis present

## 2014-10-20 DIAGNOSIS — K219 Gastro-esophageal reflux disease without esophagitis: Secondary | ICD-10-CM | POA: Diagnosis present

## 2014-10-20 DIAGNOSIS — R079 Chest pain, unspecified: Secondary | ICD-10-CM | POA: Diagnosis not present

## 2014-10-20 DIAGNOSIS — J209 Acute bronchitis, unspecified: Secondary | ICD-10-CM | POA: Diagnosis not present

## 2014-10-20 DIAGNOSIS — I214 Non-ST elevation (NSTEMI) myocardial infarction: Secondary | ICD-10-CM | POA: Diagnosis not present

## 2014-10-20 DIAGNOSIS — I10 Essential (primary) hypertension: Secondary | ICD-10-CM | POA: Diagnosis present

## 2014-10-20 DIAGNOSIS — M47894 Other spondylosis, thoracic region: Secondary | ICD-10-CM | POA: Diagnosis not present

## 2014-10-20 DIAGNOSIS — I420 Dilated cardiomyopathy: Secondary | ICD-10-CM | POA: Diagnosis present

## 2014-10-20 DIAGNOSIS — Z7982 Long term (current) use of aspirin: Secondary | ICD-10-CM

## 2014-10-20 DIAGNOSIS — Z881 Allergy status to other antibiotic agents status: Secondary | ICD-10-CM | POA: Diagnosis not present

## 2014-10-20 DIAGNOSIS — I2510 Atherosclerotic heart disease of native coronary artery without angina pectoris: Secondary | ICD-10-CM | POA: Diagnosis present

## 2014-10-20 DIAGNOSIS — Z8249 Family history of ischemic heart disease and other diseases of the circulatory system: Secondary | ICD-10-CM

## 2014-10-20 DIAGNOSIS — R072 Precordial pain: Secondary | ICD-10-CM | POA: Diagnosis present

## 2014-10-20 DIAGNOSIS — N189 Chronic kidney disease, unspecified: Secondary | ICD-10-CM | POA: Diagnosis not present

## 2014-10-20 DIAGNOSIS — I249 Acute ischemic heart disease, unspecified: Secondary | ICD-10-CM | POA: Diagnosis not present

## 2014-10-20 HISTORY — DX: Non-ST elevation (NSTEMI) myocardial infarction: I21.4

## 2014-10-20 HISTORY — DX: Atherosclerotic heart disease of native coronary artery without angina pectoris: I25.10

## 2014-10-20 HISTORY — DX: Gastro-esophageal reflux disease without esophagitis: K21.9

## 2014-10-20 LAB — BASIC METABOLIC PANEL
ANION GAP: 11 (ref 5–15)
BUN: 26 mg/dL — ABNORMAL HIGH (ref 6–20)
CHLORIDE: 102 mmol/L (ref 101–111)
CO2: 29 mmol/L (ref 22–32)
Calcium: 9.7 mg/dL (ref 8.9–10.3)
Creatinine, Ser: 1.53 mg/dL — ABNORMAL HIGH (ref 0.61–1.24)
GFR calc Af Amer: 56 mL/min — ABNORMAL LOW (ref >60)
GFR calc non Af Amer: 48 mL/min — ABNORMAL LOW (ref >60)
GLUCOSE: 127 mg/dL — AB (ref 65–99)
POTASSIUM: 4 mmol/L (ref 3.5–5.1)
SODIUM: 142 mmol/L (ref 135–145)

## 2014-10-20 LAB — POCT CBC W AUTO DIFF (K'VILLE URGENT CARE)

## 2014-10-20 LAB — CBC
HEMATOCRIT: 46.9 % (ref 39.0–52.0)
HEMOGLOBIN: 15.8 g/dL (ref 13.0–17.0)
MCH: 31.3 pg (ref 26.0–34.0)
MCHC: 33.7 g/dL (ref 30.0–36.0)
MCV: 92.9 fL (ref 78.0–100.0)
Platelets: 257 10*3/uL (ref 150–400)
RBC: 5.05 MIL/uL (ref 4.22–5.81)
RDW: 13 % (ref 11.5–15.5)
WBC: 11.1 10*3/uL — AB (ref 4.0–10.5)

## 2014-10-20 LAB — TROPONIN I
TROPONIN I: 4.91 ng/mL — AB (ref <0.06)
Troponin I: 9.78 ng/mL (ref <0.031)

## 2014-10-20 LAB — PROTIME-INR
INR: 0.97 (ref 0.00–1.49)
PROTHROMBIN TIME: 13.1 s (ref 11.6–15.2)

## 2014-10-20 LAB — APTT: aPTT: 28 seconds (ref 24–37)

## 2014-10-20 MED ORDER — ASPIRIN 81 MG PO CHEW
324.0000 mg | CHEWABLE_TABLET | Freq: Once | ORAL | Status: AC
  Administered 2014-10-20: 324 mg via ORAL
  Filled 2014-10-20: qty 4

## 2014-10-20 MED ORDER — SODIUM CHLORIDE 0.9 % IV BOLUS (SEPSIS)
1000.0000 mL | Freq: Once | INTRAVENOUS | Status: AC
Start: 2014-10-20 — End: 2014-10-21
  Administered 2014-10-21: 1000 mL via INTRAVENOUS

## 2014-10-20 MED ORDER — BENZONATATE 200 MG PO CAPS: 200.0000 mg | ORAL_CAPSULE | Freq: Every day | ORAL | Status: DC

## 2014-10-20 MED ORDER — HEPARIN (PORCINE) IN NACL 100-0.45 UNIT/ML-% IJ SOLN
12.0000 [IU]/kg/h | INTRAMUSCULAR | Status: DC
  Filled 2014-10-20: qty 250

## 2014-10-20 MED ORDER — HEPARIN (PORCINE) IN NACL 100-0.45 UNIT/ML-% IJ SOLN
1400.0000 [IU]/h | INTRAMUSCULAR | Status: DC
  Administered 2014-10-20: 1400 [IU]/h via INTRAVENOUS

## 2014-10-20 MED ORDER — HEPARIN SODIUM (PORCINE) 5000 UNIT/ML IJ SOLN
4000.0000 [IU] | Freq: Once | INTRAMUSCULAR | Status: AC
  Administered 2014-10-20: 4000 [IU] via INTRAVENOUS

## 2014-10-20 MED ORDER — METOPROLOL TARTRATE 1 MG/ML IV SOLN
5.0000 mg | Freq: Once | INTRAVENOUS | Status: AC
  Administered 2014-10-20: 5 mg via INTRAVENOUS
  Filled 2014-10-20: qty 5

## 2014-10-20 MED ORDER — SODIUM CHLORIDE 0.9 % IV SOLN
1000.0000 mL | INTRAVENOUS | Status: DC
  Administered 2014-10-20 – 2014-10-21 (×2): 1000 mL via INTRAVENOUS

## 2014-10-20 MED ORDER — AZITHROMYCIN 250 MG PO TABS: ORAL_TABLET | ORAL | Status: DC

## 2014-10-20 NOTE — Progress Notes (Deleted)
ANTICOAGULATION CONSULT NOTE - Initial Consult  Pharmacy Consult for heparin Indication: ACS/STEMI  Allergies  Allergen Reactions  . Amoxicillin     Patient Measurements: Height: 5' 9.5" (176.5 cm) Weight: 190 lb (86.183 kg) IBW/kg (Calculated) : 71.85 Heparin Dosing Weight: 86 kg  Vital Signs: Temp: 98.2 F (36.8 C) (06/17 2101) Temp Source: Oral (06/17 2101) BP: 172/96 mmHg (06/17 2101) Pulse Rate: 113 (06/17 2101)  Labs:  Recent Labs  10/20/14 1600 10/20/14 2110  HGB  --  15.8  HCT  --  46.9  PLT  --  257  TROPONINI 4.91*  --     CrCl cannot be calculated (Patient has no serum creatinine result on file.).   Medical History: Past Medical History  Diagnosis Date  . Hypertension     Medications:   (Not in a hospital admission)  Assessment: 59 yo male admitted with sudden-onset, non-radiating chest pain with SOB. Initial troponin 4.9, WBC 11, h/h and plts wnl. No anticoagulation PTA.  Goal of Therapy:  Heparin level 0.3-0.7 units/ml Monitor platelets by anticoagulation protocol: Yes   Plan:  Heparin 4000 units x1, then 1050 units/hr Daily HL, CBC Monitor for s/sx bleeding   Agapito Games, PharmD, BCPS Clinical Pharmacist Pager: [phone] 10/20/2014 9:30 PM

## 2014-10-20 NOTE — ED Provider Notes (Signed)
CSN: [id number]     Arrival date & time 10/20/14  1359 History   First MD Initiated Contact with Patient 10/20/14 1457     Chief Complaint  Patient presents with  . Nasal Congestion  . Chest Pain      HPI Comments: Patient reports that he had a cold about 2 weeks ago with coughing/sneezing and nasal congestion that improved in about one week.  About 4 days after he had improved, he developed increasing non-productive cough, worse at night.  Today while mowing grass he developed tightness in his anterior chest sternal area that lasted about 15 minutes and improved after he stopped mowing.  The pain did not radiate.  He feels tight in his anterior chest with cough and deep inspiration.  No fevers, chills, and sweats.  He notes that he had a mild rash two mornings ago that resolved with Claritin. 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  The history is provided by the patient.    Past Medical History  Diagnosis Date  . Hypertension    Past Surgical History  Procedure Laterality Date  . Tonsillectomy    . Inner ear surgery     Family History  Problem Relation Age of Onset  . Heart failure Mother   . Heart attack Sister 74    smoker    . Heart disease Mother     triple bypass    History  Substance Use Topics  . Smoking status: Never Smoker   . Smokeless tobacco: Never Used  . Alcohol Use: No    Review of Systems No sore throat + hoarse + cough No pleuritic pain, but has tightness in anterior chest + wheezing Minimal nasal congestion ? post-nasal drainage No sinus pain/pressure No itchy/red eyes No earache No hemoptysis + SOB with activity No fever/chills No nausea No vomiting No abdominal pain No diarrhea No urinary symptoms + skin rash, resolved + fatigue No myalgias No headache Used OTC meds without relief  Allergies  Amoxicillin  Home Medications   Prior to Admission medications   Medication Sig Start Date End Date Taking? Authorizing  Provider  aspirin 81 MG tablet Take 81 mg by mouth daily.      Historical Provider, MD  azithromycin (ZITHROMAX Z-PAK) 250 MG tablet Take 2 tabs today; then begin one tab once daily for 4 more days. 10/20/14   Lattie Haw, MD  benzonatate (TESSALON) 200 MG capsule Take 1 capsule (200 mg total) by mouth at bedtime. Take as needed for cough 10/20/14   Lattie Haw, MD  Calcium Carb-Cholecalciferol 600-800 MG-UNIT TABS Take by mouth.    Historical Provider, MD  CINNAMON PO Take by mouth.    Historical Provider, MD  Ginkgo Biloba 40 MG TABS Take 60 mg by mouth.    Historical Provider, MD  lisinopril (PRINIVIL,ZESTRIL) 20 MG tablet TAKE ONE TABLET BY MOUTH ONCE DAILY. 05/31/14   Laren Boom, DO  Multiple Vitamin (MULTIVITAMIN) capsule Take 1 capsule by mouth daily.      Historical Provider, MD  Multiple Vitamins-Minerals (OCUVITE PO) Take by mouth.    Historical Provider, MD  polycarbophil (FIBERCON) 625 MG tablet Take 625 mg by mouth daily.    Historical Provider, MD  ranitidine (ZANTAC) 150 MG tablet Take 150 mg by mouth 2 (two) times daily.      Historical Provider, MD  vitamin C (ASCORBIC ACID) 500 MG tablet Take 500 mg by mouth daily.    Historical Provider,  MD   BP 148/98 mmHg  Pulse 85  Temp(Src) 98.4 F (36.9 C) (Oral)  Wt 190 lb (86.183 kg)  SpO2 97% Physical Exam Nursing notes and Vital Signs reviewed. Appearance:  Patient appears stated age, and in no acute distress Eyes:  Pupils are equal, round, and reactive to light and accomodation.  Extraocular movement is intact.  Conjunctivae are not inflamed  Ears:  Canals normal.  Tympanic membranes normal.  Nose:  Mildly congested turbinates.  No sinus tenderness.   Pharynx:  Normal Neck:  Supple.  Prominent posterior nodes are palpated bilaterally  Lungs:  Clear to auscultation.  Breath sounds are equal.  Chest:  Mild tenderness to palpation over the mid-sternum.  Heart:  Regular rate and rhythm without murmurs, rubs, or gallops.   Abdomen:  Nontender without masses or hepatosplenomegaly.  Bowel sounds are present.  No CVA or flank tenderness.  Extremities:  No edema.  No calf tenderness Skin:  No rash present.   ED Course  Procedures  None    Labs Reviewed  TROPONIN I  TROPONIN T  POCT CBC W AUTO DIFF ([facility]):  WBC 12.0; LY 11.0; MO 4.3; GR 84.7; Hgb 15.2; Platelets 254    EKG: Rate:  83 BPM PR:  140 msec QT:  356 msec QTcH:  396 msec QRSD:  92 msec QRS axis:  19 degrees Interpretation:  Sinus Rhythm; no acute changes   Imaging Review Dg Chest 2 View  10/20/2014   CLINICAL DATA:  Cough. Congestion. Upper respiratory infection for 2 weeks. Chest tightness. Shortness of breath.  EXAM: CHEST  2 VIEW  COMPARISON:  None.  FINDINGS: Thoracic spondylosis.  The lungs appear clear. Cardiac and mediastinal contours normal. No pleural effusion identified.  IMPRESSION: Thoracic spondylosis.  Otherwise negative.   Electronically Signed   By: Gaylyn Rong M.D.   On: 10/20/2014 15:49     MDM   1. Acute bronchitis, unspecified organism.  Note leukocytosis 12.0    Patient's sternal chest pain is probably costochondritis.  However, with family history of ASCVD, will send stat Troponin. Begin Z-pack for atypical coverage.  Prescription written for Benzonatate [facility]) to take at bedtime for night-time cough.   Take plain guaifenesin (1200mg  extended release tabs such as Mucinex) twice daily, with plenty of water, for cough and congestion.  Get adequate rest.   May use Afrin nasal spray (or generic oxymetazoline) twice daily for about 5 days.  Also recommend using saline nasal spray several times daily and saline nasal irrigation (AYR is a common brand).   Stop all antihistamines for now, and other non-prescription cough/cold preparations. May take Ibuprofen 200mg , 4 tabs every 8 hours with food for chest/sternum discomfort. Follow-up with family doctor in one week.     Lattie Haw,  MD 10/20/14 [phone]

## 2014-10-20 NOTE — Telephone Encounter (Signed)
I received a call from [facility] about a transfer to [facility] on Lee Taylor for NSTEMI.   In summary, patient is a 59 year old male with history of hypertension, family history of coronary artery disease comes to the emergency department after having an episode of chest pain after mowing a lawn that resolved after 15 minutes. He went to an urgent care this afternoon where he had troponin levels checked which were elevated at 4.9. He was then asked to go to the emergency department for further evaluation. In the emergency department, patient reportedly is very comfortable without any active chest pain, pressure, shortness of breath, diaphoresis. He has sinus tachycardia with blood pressure 172/96. His troponin levels were noted to be 9.78 in the ED.  He will be transferred to [facility] in the stepdown unit for management of non-ST elevation myocardial infarction. I have requested that Dr. Lynelle Doctor obtain another EKG now. Start patient on heparin drip ACS nomogram and give to IV fluids as patient has acute kidney injury creatinine of 1.53.

## 2014-10-20 NOTE — Discharge Instructions (Signed)
Take plain guaifenesin (1200mg  extended release tabs such as Mucinex) twice daily, with plenty of water, for cough and congestion.  Get adequate rest.   May use Afrin nasal spray (or generic oxymetazoline) twice daily for about 5 days.  Also recommend using saline nasal spray several times daily and saline nasal irrigation (AYR is a common brand).   Stop all antihistamines for now, and other non-prescription cough/cold preparations. May take Ibuprofen 200mg , 4 tabs every 8 hours with food for chest/sternum discomfort. Follow-up with family doctor if not improving.

## 2014-10-20 NOTE — ED Notes (Signed)
Pt c/o cough, congestion and a tightness in his chest x1 and half weeks. States the fullness in his chest has worsened.

## 2014-10-20 NOTE — Progress Notes (Signed)
ANTICOAGULATION CONSULT NOTE - Initial Consult  Pharmacy Consult for Heparin Indication: chest pain/ACS  Allergies  Allergen Reactions  . Amoxicillin     Patient Measurements: Height: 5' 9.5" (176.5 cm) Weight: 190 lb (86.183 kg) IBW/kg (Calculated) : 71.85  Vital Signs: Temp: 98.2 F (36.8 C) (06/17 2101) Temp Source: Oral (06/17 2101) BP: 172/96 mmHg (06/17 2101) Pulse Rate: 113 (06/17 2101)  Labs:  Recent Labs  10/20/14 1600 10/20/14 2110  HGB  --  15.8  HCT  --  46.9  PLT  --  257  TROPONINI 4.91*  --     CrCl cannot be calculated (Patient has no serum creatinine result on file.).   Medical History: Past Medical History  Diagnosis Date  . Hypertension     Assessment: 59 year old male with chest pain and SOB while mowing his lawn.  He saw his MD this afternoon and was sent home.  He was asked to come to the ED when his troponin came back positive.  Pharmacy now to dose heparin  Goal of Therapy:  Heparin level 0.3-0.7 units/ml Monitor platelets by anticoagulation protocol: Yes   Plan:  Heparin 4000 units iv bolus x 1 Heparin drip at 1400 units / hr Daily heparin level, CBC  Thank you. Okey Regal, PharmD [phone]  10/20/2014,9:28 PM

## 2014-10-20 NOTE — ED Notes (Signed)
Recent cough. Chest pain and sob this am while push mowing the yard. He saw his MD this afternoon and sent him home. They called him with a positive troponin of 4.91.

## 2014-10-20 NOTE — Progress Notes (Signed)
Placed patient on 2 liter nasal cannula.  Patient's SPO2 now 100%

## 2014-10-20 NOTE — ED Provider Notes (Signed)
CSN: [id number]     Arrival date & time 10/20/14  2056 History  This chart was scribed for Linwood Dibbles, MD by Bronson Curb, ED Scribe. This patient was seen in room MH10/MH10 and the patient's care was started at 9:08 PM.   Chief Complaint  Patient presents with  . Chest Pain    The history is provided by the patient. No language interpreter was used.     HPI Comments: Lee Taylor is a 60 y.o. male who presents to the Emergency Department complaining of sudden onset, non-radiating, central chest pain with associated SOB that began earlier today. Patient states he was mowing the lawn when the pain began. He reports the episode lasted approximately 15 minutes and relieved with rest. He was seen at Urgent Care and was later discharged home. He later received a phone call informing him of a positive troponin of 4.91 and was advised to go the ED for further evaluation of possible MI. He reports family history of heart disease, stating his mother had a triple bypass. Patient denies any chest pain at this time. He further denies nausea, vomiting, or diaphoresis.    Past Medical History  Diagnosis Date  . Hypertension    Past Surgical History  Procedure Laterality Date  . Tonsillectomy    . Inner ear surgery     Family History  Problem Relation Age of Onset  . Heart failure Mother   . Heart attack Sister 22    smoker    . Heart disease Mother     triple bypass    History  Substance Use Topics  . Smoking status: Never Smoker   . Smokeless tobacco: Never Used  . Alcohol Use: No    Review of Systems  Constitutional: Negative for diaphoresis.  Cardiovascular: Positive for chest pain.  Gastrointestinal: Negative for nausea and vomiting.  All other systems reviewed and are negative.     Allergies  Amoxicillin  Home Medications   Prior to Admission medications   Medication Sig Start Date End Date Taking? Authorizing Provider  aspirin 81 MG tablet Take 81 mg by mouth  daily.      Historical Provider, MD  azithromycin (ZITHROMAX Z-PAK) 250 MG tablet Take 2 tabs today; then begin one tab once daily for 4 more days. 10/20/14   Lattie Haw, MD  benzonatate (TESSALON) 200 MG capsule Take 1 capsule (200 mg total) by mouth at bedtime. Take as needed for cough 10/20/14   Lattie Haw, MD  Calcium Carb-Cholecalciferol 600-800 MG-UNIT TABS Take by mouth.    Historical Provider, MD  CINNAMON PO Take by mouth.    Historical Provider, MD  Ginkgo Biloba 40 MG TABS Take 60 mg by mouth.    Historical Provider, MD  lisinopril (PRINIVIL,ZESTRIL) 20 MG tablet TAKE ONE TABLET BY MOUTH ONCE DAILY. 05/31/14   Laren Boom, DO  Multiple Vitamin (MULTIVITAMIN) capsule Take 1 capsule by mouth daily.      Historical Provider, MD  Multiple Vitamins-Minerals (OCUVITE PO) Take by mouth.    Historical Provider, MD  polycarbophil (FIBERCON) 625 MG tablet Take 625 mg by mouth daily.    Historical Provider, MD  ranitidine (ZANTAC) 150 MG tablet Take 150 mg by mouth 2 (two) times daily.      Historical Provider, MD  vitamin C (ASCORBIC ACID) 500 MG tablet Take 500 mg by mouth daily.    Historical Provider, MD   Triage Vitals: BP 172/96 mmHg  Pulse 113  Temp(Src) 98.2 F (  36.8 C) (Oral)  Resp 20  Ht 5' 9.5" (1.765 m)  Wt 190 lb (86.183 kg)  BMI 27.67 kg/m2  SpO2 98%  Physical Exam  Constitutional: He appears well-developed and well-nourished. No distress.  HENT:  Head: Normocephalic and atraumatic.  Right Ear: External ear normal.  Left Ear: External ear normal.  Eyes: Conjunctivae are normal. Right eye exhibits no discharge. Left eye exhibits no discharge. No scleral icterus.  Neck: Neck supple. No tracheal deviation present.  Cardiovascular: Normal rate, regular rhythm and intact distal pulses.   Pulmonary/Chest: Effort normal and breath sounds normal. No stridor. No respiratory distress. He has no wheezes. He has no rales.  Abdominal: Soft. Bowel sounds are normal. He  exhibits no distension. There is no tenderness. There is no rebound and no guarding.  Musculoskeletal: He exhibits no edema or tenderness.  Neurological: He is alert. He has normal strength. No cranial nerve deficit (no facial droop, extraocular movements intact, no slurred speech) or sensory deficit. He exhibits normal muscle tone. He displays no seizure activity. Coordination normal.  Skin: Skin is warm and dry. No rash noted.  Psychiatric: He has a normal mood and affect.  Nursing note and vitals reviewed.   ED Course  Procedures (including critical care time)  DIAGNOSTIC STUDIES: Oxygen Saturation is 98% on room air, normal by my interpretation.    COORDINATION OF CARE: At 2114 Discussed treatment plan with patient which includes labs. Patient agrees.   Labs Review Labs Reviewed  TROPONIN I - Abnormal; Notable for the following:    Troponin I 9.78 (*)    All other components within normal limits  CBC - Abnormal; Notable for the following:    WBC 11.1 (*)    All other components within normal limits  BASIC METABOLIC PANEL - Abnormal; Notable for the following:    Glucose, Bld 127 (*)    BUN 26 (*)    Creatinine, Ser 1.53 (*)    GFR calc non Af Amer 48 (*)    GFR calc Af Amer 56 (*)    All other components within normal limits  APTT  PROTIME-INR  HEPARIN LEVEL (UNFRACTIONATED)  CBC    Imaging Review Dg Chest 2 View  10/20/2014   CLINICAL DATA:  Cough. Congestion. Upper respiratory infection for 2 weeks. Chest tightness. Shortness of breath.  EXAM: CHEST  2 VIEW  COMPARISON:  None.  FINDINGS: Thoracic spondylosis.  The lungs appear clear. Cardiac and mediastinal contours normal. No pleural effusion identified.  IMPRESSION: Thoracic spondylosis.  Otherwise negative.   Electronically Signed   By: Gaylyn Rong M.D.   On: 10/20/2014 15:49     EKG Interpretation   Date/Time:  Friday October 20 2014 21:02:18 EDT Ventricular Rate:  111 PR Interval:  146 QRS Duration:  92 QT Interval:  318 QTC Calculation: 432 R Axis:   -102 Text Interpretation:  Sinus tachycardia with Premature ventricular  complexes or Fusion complexes Inferior-posterior infarct , age  undetermined Anterolateral infarct , age undetermined Abnormal ECG No old  tracing to compare Confirmed by Kattaleya Alia  MD-J, Jassen Sarver ([id number]) on 10/20/2014  9:20:41 PM     CRITICAL CARE Performed by: VHQIO,NGE Total critical care time: 30 Critical care time was exclusive of separately billable procedures and treating other patients. Critical care was necessary to treat or prevent imminent or life-threatening deterioration. Critical care was time spent personally by me on the following activities: development of treatment plan with patient and/or surrogate as well as nursing, discussions with  consultants, evaluation of patient's response to treatment, examination of patient, obtaining history from patient or surrogate, ordering and performing treatments and interventions, ordering and review of laboratory studies, ordering and review of radiographic studies, pulse oximetry and re-evaluation of patient's condition.  MDM   Final diagnoses:  NSTEMI (non-ST elevated myocardial infarction)    The patient had an episode of chest pain earlier today associated with exertion. He has been symptom free since earlier this afternoon.  Patient had a troponin performed at an urgent care. He was elevated at 4. The patient remains pain free but his troponin is increasing.  Patient is ruling in for a non-ST elevation MI. He is also tachycardic. I will start him on beta blockers. IV heparin has been initiated. The patient was also given an aspirin. I will consult with the cardiologist service at [facility] for admission and further treatment  I personally performed the services described in this documentation, which was scribed in my presence.  The recorded information has been reviewed and is accurate.    Linwood Dibbles,  MD 10/20/14 2206

## 2014-10-21 ENCOUNTER — Encounter (HOSPITAL_COMMUNITY): Payer: Self-pay | Admitting: *Deleted

## 2014-10-21 ENCOUNTER — Inpatient Hospital Stay (HOSPITAL_COMMUNITY): Payer: BLUE CROSS/BLUE SHIELD

## 2014-10-21 ENCOUNTER — Other Ambulatory Visit: Payer: Self-pay

## 2014-10-21 DIAGNOSIS — I214 Non-ST elevation (NSTEMI) myocardial infarction: Secondary | ICD-10-CM

## 2014-10-21 DIAGNOSIS — I249 Acute ischemic heart disease, unspecified: Secondary | ICD-10-CM | POA: Diagnosis present

## 2014-10-21 DIAGNOSIS — R079 Chest pain, unspecified: Secondary | ICD-10-CM

## 2014-10-21 LAB — TROPONIN T

## 2014-10-21 LAB — HEPARIN LEVEL (UNFRACTIONATED)
Heparin Unfractionated: 0.92 IU/mL — ABNORMAL HIGH (ref 0.30–0.70)
Heparin Unfractionated: 0.68 IU/mL (ref 0.30–0.70)

## 2014-10-21 LAB — BASIC METABOLIC PANEL
ANION GAP: 8 (ref 5–15)
BUN: 21 mg/dL — ABNORMAL HIGH (ref 6–20)
CHLORIDE: 107 mmol/L (ref 101–111)
CO2: 24 mmol/L (ref 22–32)
Calcium: 8.8 mg/dL — ABNORMAL LOW (ref 8.9–10.3)
Creatinine, Ser: 1.25 mg/dL — ABNORMAL HIGH (ref 0.61–1.24)
GFR calc Af Amer: >60 mL/min (ref >60)
Glucose, Bld: 113 mg/dL — ABNORMAL HIGH (ref 65–99)
Potassium: 4.6 mmol/L (ref 3.5–5.1)
Sodium: 139 mmol/L (ref 135–145)

## 2014-10-21 LAB — TROPONIN I
TROPONIN I: 3.77 ng/mL — AB (ref <0.031)
Troponin I: 5.18 ng/mL (ref <0.031)
Troponin I: 3.85 ng/mL (ref <0.031)

## 2014-10-21 LAB — MRSA PCR SCREENING: MRSA by PCR: NEGATIVE

## 2014-10-21 MED ORDER — ASPIRIN 81 MG PO CHEW
81.0000 mg | CHEWABLE_TABLET | Freq: Every day | ORAL | Status: DC
  Administered 2014-10-21 – 2014-10-22 (×2): 81 mg via ORAL
  Filled 2014-10-21 (×2): qty 1

## 2014-10-21 MED ORDER — ATORVASTATIN CALCIUM 80 MG PO TABS
80.0000 mg | ORAL_TABLET | Freq: Every day | ORAL | Status: DC
  Administered 2014-10-21 – 2014-10-23 (×4): 80 mg via ORAL
  Filled 2014-10-21 (×4): qty 1

## 2014-10-21 MED ORDER — SODIUM CHLORIDE 0.9 % IV SOLN
INTRAVENOUS | Status: AC
  Administered 2014-10-21 (×2): via INTRAVENOUS

## 2014-10-21 MED ORDER — HEPARIN (PORCINE) IN NACL 100-0.45 UNIT/ML-% IJ SOLN
1300.0000 [IU]/h | INTRAMUSCULAR | Status: DC
  Administered 2014-10-21: 1250 [IU]/h via INTRAVENOUS
  Administered 2014-10-22: 1100 [IU]/h via INTRAVENOUS
  Administered 2014-10-23: 1300 [IU]/h via INTRAVENOUS
  Filled 2014-10-21 (×3): qty 250

## 2014-10-21 MED ORDER — METOPROLOL TARTRATE 12.5 MG HALF TABLET
12.5000 mg | ORAL_TABLET | Freq: Two times a day (BID) | ORAL | Status: DC
  Administered 2014-10-21 – 2014-10-23 (×6): 12.5 mg via ORAL
  Filled 2014-10-21 (×6): qty 1

## 2014-10-21 MED ORDER — ACETAMINOPHEN 325 MG PO TABS: 650.0000 mg | ORAL_TABLET | ORAL | Status: DC | PRN

## 2014-10-21 MED ORDER — FAMOTIDINE 20 MG PO TABS
20.0000 mg | ORAL_TABLET | Freq: Two times a day (BID) | ORAL | Status: DC
  Administered 2014-10-21 – 2014-10-24 (×8): 20 mg via ORAL
  Filled 2014-10-21 (×10): qty 1

## 2014-10-21 MED ORDER — NITROGLYCERIN 0.4 MG SL SUBL: 0.4000 mg | SUBLINGUAL_TABLET | SUBLINGUAL | Status: DC | PRN

## 2014-10-21 MED ORDER — ONDANSETRON HCL 4 MG/2ML IJ SOLN: 4.0000 mg | Freq: Four times a day (QID) | INTRAMUSCULAR | Status: DC | PRN

## 2014-10-21 NOTE — Progress Notes (Signed)
  Echocardiogram 2D Echocardiogram has been performed.  Lee Taylor 10/21/2014, 3:30 PM

## 2014-10-21 NOTE — Progress Notes (Signed)
CRITICAL VALUE ALERT  Critical value received:  Troponin  Date of notification:  10/21/14  Time of notification:  8:00  Critical value read back:Yes.    Nurse who received alert:  Tommy Medal  MD notified (1st page):  Azalee Course  Time of first page:  8:01  MD notified (2nd page):  Time of second page:  Responding MD:  Azalee Course  Time MD responded:  8:05

## 2014-10-21 NOTE — H&P (Signed)
CARDIOLOGY ADMISSION NOTE  Assessment and Plan:  *Non-ST elevation myocardial infarction: Lee Taylor is a 59 year old male with history of hypertension, family history of coronary artery disease comes to the emergency department with acute onset substernal chest pressure with diaphoresis after mowing the lawn which lasted for 15 minutes. EKG reveals no evidence of acute ST-T wave abnormalities but suggests possible in the anterolateral and inferior Q waves. Troponin levels are elevated at 9. Patient is currently chest pain-free. --Continue aspirin 81 mg daily. Patient is status post 325 mg aspirin load. --Start atorvastatin 80 mg daily --Continue heparin ACS nomogram --We'll start metoprolol tartrate 12.5 mg by mouth twice a day --Left heart catheterization and transthoracic echocardiogram in the morning --Continue telemetry monitoring and cycling cardiac enzymes 3 or until trending down  *Acute kidney injury: No history of chronic disease. Creatinine elevated at 1.53. Prerenal versus lisinopril. Hold off on lisinopril and start IV hydration.  *Hypertension: We'll hold off on lisinopril due to elevated creatinine. Starting metoprolol.  Chief complaint: Chest pain  HPI:  Lee Taylor is a 59 year old male with history of hypertension, family history of coronary artery disease comes to the emergency department with acute onset substernal chest pressure with diaphoresis after mowing the lawn which lasted for 15 minutes. After waiting for approximately 15 minutes, pain gradually went away. He decided to seek medical attention at a local urgent care who ordered a troponin levels which were noted to be elevated at 4. Patient was asked to go to the local emergency department. Patient initially went to [facility] ED. Upon arrival, patient had no active symptoms. He was transferred to the [facility] as is second set of troponins returned elevated at 9. At the other hospital, patient was  started on aspirin, heparin drip.  Of note, patient endorses symptoms of upper respiratory infection approximately 2 weeks ago. He endorses gradual improvement in the sinus drainage but now is endorsing dry cough without any evidence of volume overload such as orthopnea, PND or lower extremity edema.    Past Medical History Past Medical History  Diagnosis Date  . Hypertension     Allergies: Allergies  Allergen Reactions  . Amoxicillin     Social History History   Social History  . Marital Status: Single    Spouse Name: Lee Taylor   . Number of Children: 2  . Years of Education: N/A   Occupational History  . Museum/gallery exhibitions officer    Social History Main Topics  . Smoking status: Never Smoker   . Smokeless tobacco: Never Used  . Alcohol Use: No  . Drug Use: No  . Sexual Activity:    Partners: Female   Other Topics Concern  . Not on file   Social History Narrative    Family History Family History  Problem Relation Age of Onset  . Heart failure Mother   . Heart attack Sister 52    smoker    . Heart disease Mother     triple bypass     Physical Exam Filed Vitals:   10/21/14 0135  BP: 151/99  Pulse: 83  Temp: 99 F (37.2 C)  Resp: 23    Gen: comfortable appearing in no acute distress HEENT: Extra ocular motions intact, moist mucous membranes Neck: no JVD, no carotid bruits CV: regular rate rhythm, normal S1, normal S2, no obvious murmurs, no rubs or gallops, +2 pulses in bilateral upper extremities  Pulm: Clear to auscultation bilaterally Abdomen: soft nontender nondistended  Ext: no cyanosis clubbing  or edema  Labs:  Results for orders placed or performed during the hospital encounter of 10/20/14 (from the past 24 hour(s))  APTT     Status: None   Collection Time: 10/20/14  9:09 PM  Result Value Ref Range   aPTT 28 24 - 37 seconds  Protime-INR     Status: None   Collection Time: 10/20/14  9:09 PM  Result Value Ref Range   Prothrombin Time 13.1  11.6 - 15.2 seconds   INR 0.97 0.00 - 1.49  Troponin I     Status: Abnormal   Collection Time: 10/20/14  9:10 PM  Result Value Ref Range   Troponin I 9.78 (HH) <0.031 ng/mL  CBC     Status: Abnormal   Collection Time: 10/20/14  9:10 PM  Result Value Ref Range   WBC 11.1 (H) 4.0 - 10.5 K/uL   RBC 5.05 4.22 - 5.81 MIL/uL   Hemoglobin 15.8 13.0 - 17.0 g/dL   HCT 61.2 24.4 - 97.5 %   MCV 92.9 78.0 - 100.0 fL   MCH 31.3 26.0 - 34.0 pg   MCHC 33.7 30.0 - 36.0 g/dL   RDW 30.0 51.1 - 02.1 %   Platelets 257 150 - 400 K/uL  Basic metabolic panel     Status: Abnormal   Collection Time: 10/20/14  9:10 PM  Result Value Ref Range   Sodium 142 135 - 145 mmol/L   Potassium 4.0 3.5 - 5.1 mmol/L   Chloride 102 101 - 111 mmol/L   CO2 29 22 - 32 mmol/L   Glucose, Bld 127 (H) 65 - 99 mg/dL   BUN 26 (H) 6 - 20 mg/dL   Creatinine, Ser 1.17 (H) 0.61 - 1.24 mg/dL   Calcium 9.7 8.9 - 35.6 mg/dL   GFR calc non Af Amer 48 (L) >60 mL/min   GFR calc Af Amer 56 (L) >60 mL/min   Anion gap 11 5 - 15

## 2014-10-21 NOTE — Progress Notes (Signed)
Interval coverage note: (please see full H&P this morning by night card fellow)  59 yo male with h/o HTN and FHx of CAD present with acute onset of CP with diaphoresis after mowing the lawn. Initially saught medical attention at Urgent care, trop noted to be elevated, he was told to go to ED. He went to [facility] ED and transferred to [facility] for further workup.   Subjective: CP free today, no SOB. States he has been having some congestion 2 weeks, was getting better, however congestion came back yesterday along with CP. He thought he was having recurrent bronchitis  Physical exam:  Gen: A&O x 3, no acute distress Card: RRR, no murmur Lung: CTA, no rale, wheezing or ronchi Abdomen: soft, nontender.  Plan:  1. NSTEMI  - on lisinopril at home, currently on hold. Metoprolol started   - reportedly had trop of 4 at urgent care, trop 4.91 on arrival --> 9.78 --> 5.18  - EKG showed Q waves in inferolateral leads, poor R wave progression in anterior lead, no prior EKG to compare to  - Continue ASA, lipitor and metoprolol. Will discuss with MD timing of cath  - risk and benefit of cardiac cath with possible PCI explained to the patient include bleeding, vascular/renal injury, arrhythmia require shock, MI, stroke, loss of life or limb. He displayed clear understanding and agree to proceed.   2. Acute vs chronic renal insufficiency  - holding off ACEI, on IV hydration  - Cr 1.53 on arrival, unknown duration  3. HTN  Leonides Schanz Azalee Course PA Pager: [phone] Plan reviewed. Continue IV hydration. Catheterization on Monday. Continue heparin and aspirin and statins.

## 2014-10-21 NOTE — Progress Notes (Signed)
ANTICOAGULATION CONSULT NOTE - Follow Up Consult  Pharmacy Consult for Heparin Indication: chest pain/ACS  Allergies  Allergen Reactions  . Amoxicillin     Patient Measurements: Height: 5\' 9"  (175.3 cm) Weight: 183 lb 14.4 oz (83.416 kg) IBW/kg (Calculated) : 70.7  Vital Signs: Temp: 98.1 F (36.7 C) (06/18 1357) Temp Source: Oral (06/18 1357) BP: 118/74 mmHg (06/18 1357) Pulse Rate: 73 (06/18 1357)  Labs:  Recent Labs  10/20/14 2109 10/20/14 2110 10/21/14 0658 10/21/14 0702 10/21/14 0924 10/21/14 1435 10/21/14 1809  HGB  --  15.8  --   --   --   --   --   HCT  --  46.9  --   --   --   --   --   PLT  --  257  --   --   --   --   --   APTT 28  --   --   --   --   --   --   LABPROT 13.1  --   --   --   --   --   --   INR 0.97  --   --   --   --   --   --   HEPARINUNFRC  --   --   --   --  0.92*  --  0.68  CREATININE  --  1.53*  --  1.25*  --   --   --   TROPONINI  --  9.78* 5.18*  --   --  3.77*  --     Estimated Creatinine Clearance: 63.6 mL/min (by C-G formula based on Cr of 1.25).   Medical History: Past Medical History  Diagnosis Date  . Hypertension     Assessment: 59 year old male with chest pain and SOB while mowing his lawn.  He saw his MD this afternoon and was sent home.  He was asked to come to the ED when his troponin came back positive. Currently on IV heparin at 1400 units/hr. AM HL supra-therapeutic at 0.92. Planning LHC.[name] No s/s of bleeding reported. CBC pending.  Follow-up HL is therapeutic at 0.68 on heparin 1250 units/hr. No issues with infusion or bleeding noted.  Goal of Therapy:  Heparin level 0.3-0.7 units/ml Monitor platelets by anticoagulation protocol: Yes   Plan:  Continue heparin at 1250 units/hr 6h confirmatory HL Daily heparin level, CBC  Arlean Hopping. Newman Pies, PharmD Clinical Pharmacist Pager [phone]

## 2014-10-21 NOTE — Progress Notes (Signed)
ANTICOAGULATION CONSULT NOTE - Follow Up Consult  Pharmacy Consult for Heparin Indication: chest pain/ACS  Allergies  Allergen Reactions  . Amoxicillin     Patient Measurements: Height: 5\' 9"  (175.3 cm) Weight: 183 lb 14.4 oz (83.416 kg) IBW/kg (Calculated) : 70.7  Vital Signs: Temp: 98.5 F (36.9 C) (06/18 0735) Temp Source: Oral (06/18 0735) BP: 109/73 mmHg (06/18 0735) Pulse Rate: 72 (06/18 0735)  Labs:  Recent Labs  10/20/14 1600 10/20/14 2109 10/20/14 2110 10/21/14 0658 10/21/14 0702 10/21/14 0924  HGB  --   --  15.8  --   --   --   HCT  --   --  46.9  --   --   --   PLT  --   --  257  --   --   --   APTT  --  28  --   --   --   --   LABPROT  --  13.1  --   --   --   --   INR  --  0.97  --   --   --   --   HEPARINUNFRC  --   --   --   --   --  0.92*  CREATININE  --   --  1.53*  --  1.25*  --   TROPONINI 4.91*  --  9.78* 5.18*  --   --     Estimated Creatinine Clearance: 63.6 mL/min (by C-G formula based on Cr of 1.25).   Medical History: Past Medical History  Diagnosis Date  . Hypertension     Assessment: 59 year old male with chest pain and SOB while mowing his lawn.  He saw his MD this afternoon and was sent home.  He was asked to come to the ED when his troponin came back positive. Currently on IV heparin at 1400 units/hr. AM HL supra-therapeutic at 0.92. Planning LHC.[name] No s/s of bleeding reported. CBC pending.   Goal of Therapy:  Heparin level 0.3-0.7 units/ml Monitor platelets by anticoagulation protocol: Yes   Plan:  Decrease Heparin drip to 1250 units/hr F/u 6 hr HL  Daily heparin level, CBC  Vinnie Level, PharmD., BCPS Clinical Pharmacist Pager [phone]

## 2014-10-21 NOTE — ED Notes (Signed)
Report given to Jennifer, RN

## 2014-10-22 ENCOUNTER — Other Ambulatory Visit: Payer: Self-pay

## 2014-10-22 DIAGNOSIS — I249 Acute ischemic heart disease, unspecified: Secondary | ICD-10-CM

## 2014-10-22 LAB — HEPARIN LEVEL (UNFRACTIONATED)
Heparin Unfractionated: 0.25 IU/mL — ABNORMAL LOW (ref 0.30–0.70)
Heparin Unfractionated: 0.3 IU/mL (ref 0.30–0.70)
Heparin Unfractionated: 0.54 IU/mL (ref 0.30–0.70)
Heparin Unfractionated: 0.72 IU/mL — ABNORMAL HIGH (ref 0.30–0.70)

## 2014-10-22 LAB — CBC
HEMATOCRIT: 41.2 % (ref 39.0–52.0)
Hemoglobin: 14 g/dL (ref 13.0–17.0)
MCH: 31.4 pg (ref 26.0–34.0)
MCHC: 34 g/dL (ref 30.0–36.0)
MCV: 92.4 fL (ref 78.0–100.0)
Platelets: 194 10*3/uL (ref 150–400)
RBC: 4.46 MIL/uL (ref 4.22–5.81)
RDW: 13.1 % (ref 11.5–15.5)
WBC: 8 10*3/uL (ref 4.0–10.5)

## 2014-10-22 MED ORDER — ASPIRIN 81 MG PO CHEW
81.0000 mg | CHEWABLE_TABLET | ORAL | Status: AC
  Administered 2014-10-23: 81 mg via ORAL
  Filled 2014-10-22: qty 1

## 2014-10-22 MED ORDER — SODIUM CHLORIDE 0.9 % IJ SOLN
3.0000 mL | Freq: Two times a day (BID) | INTRAMUSCULAR | Status: DC
  Administered 2014-10-23: 3 mL via INTRAVENOUS

## 2014-10-22 MED ORDER — SODIUM CHLORIDE 0.9 % IJ SOLN: 3.0000 mL | INTRAMUSCULAR | Status: DC | PRN

## 2014-10-22 MED ORDER — HEPARIN BOLUS VIA INFUSION
1000.0000 [IU] | Freq: Once | INTRAVENOUS | Status: AC
  Administered 2014-10-22: 1000 [IU] via INTRAVENOUS
  Filled 2014-10-22: qty 1000

## 2014-10-22 MED ORDER — SODIUM CHLORIDE 0.9 % IV SOLN: 250.0000 mL | INTRAVENOUS | Status: DC | PRN

## 2014-10-22 MED ORDER — SODIUM CHLORIDE 0.9 % IV SOLN
INTRAVENOUS | Status: DC
  Administered 2014-10-23 (×2): via INTRAVENOUS

## 2014-10-22 NOTE — Progress Notes (Addendum)
Patient Name: Lee Taylor Date of Encounter: 10/22/2014  Primary Cardiologist: new   Active Problems:   NSTEMI (non-ST elevated myocardial infarction)   ACS (acute coronary syndrome)    SUBJECTIVE  Denies any CP or SOB   CURRENT MEDS . aspirin  81 mg Oral Daily  . atorvastatin  80 mg Oral q1800  . famotidine  20 mg Oral BID  . metoprolol tartrate  12.5 mg Oral BID    OBJECTIVE  Filed Vitals:   10/21/14 1357 10/21/14 2044 10/22/14 0546 10/22/14 0925  BP: 118/74 114/70 111/69 122/69  Pulse: 73 69 67 80  Temp: 98.1 F (36.7 C) 98.3 F (36.8 C) 98.5 F (36.9 C)   TempSrc: Oral Oral Oral   Resp: Height:      Weight:   183 lb 3.2 oz (83.099 kg)   SpO2: 98% 97% 95%     Intake/Output Summary (Last 24 hours) at 10/22/14 0936 Last data filed at 10/22/14 0900  Gross per 24 hour  Intake 1074.24 ml  Output   1200 ml  Net -125.76 ml   Filed Weights   10/20/14 2101 10/21/14 0135 10/22/14 0546  Weight: 190 lb (86.183 kg) 183 lb 14.4 oz (83.416 kg) 183 lb 3.2 oz (83.099 kg)    PHYSICAL EXAM  General: Pleasant, NAD. Neuro: Alert and oriented X 3. Moves all extremities spontaneously. Psych: Normal affect. HEENT:  Normal  Neck: Supple without bruits or JVD. Lungs:  Resp regular and unlabored, CTA. Heart: RRR no s3, s4, or murmurs. Abdomen: Soft, non-tender, non-distended, BS + x 4.  Extremities: No clubbing, cyanosis or edema. DP/PT/Radials 2+ and equal bilaterally.  Accessory Clinical Findings  CBC  Recent Labs  10/20/14 2110 10/22/14 0744  WBC 11.1* 8.0  HGB 15.8 14.0  HCT 46.9 41.2  MCV 92.9 92.4  PLT 257 194   Basic Metabolic Panel  Recent Labs  10/20/14 2110 10/21/14 0702  NA 142 139  K 4.0 4.6  CL 102 107  CO2 29 24  GLUCOSE 127* 113*  BUN 26* 21*  CREATININE 1.53* 1.25*  CALCIUM 9.7 8.8*   Cardiac Enzymes  Recent Labs  10/21/14 0658 10/21/14 1435 10/21/14 1811  TROPONINI 5.18* 3.77* 3.85*   TELE NSR     ECG  No new EKG  Echocardiogram  pending    Radiology/Studies  Dg Chest 2 View  10/20/2014   CLINICAL DATA:  Cough. Congestion. Upper respiratory infection for 2 weeks. Chest tightness. Shortness of breath.  EXAM: CHEST  2 VIEW  COMPARISON:  None.  FINDINGS: Thoracic spondylosis.  The lungs appear clear. Cardiac and mediastinal contours normal. No pleural effusion identified.  IMPRESSION: Thoracic spondylosis.  Otherwise negative.   Electronically Signed   By: Gaylyn Rong M.D.   On: 10/20/2014 15:49    ASSESSMENT AND PLAN  1. NSTEMI - on lisinopril at home, currently on hold. Metoprolol started  - reportedly had trop of 4 at urgent care, trop 4.91 on arrival --> 9.78 --> 5.18 - EKG showed Q waves in inferolateral leads, poor R wave progression in anterior lead, no prior EKG to compare to - Continue ASA, lipitor and metoprolol.   - plan for cath tomorrow. Pending echo, done yesterday - risk and benefit of cardiac cath with possible PCI explained to the patient include bleeding, vascular/renal injury, arrhythmia require shock, MI, stroke, loss of life or limb. He displayed clear understanding and agree to proceed.   2. Acute vs chronic renal  insufficiency - holding off ACEI, on IV hydration - Cr 1.53 on arrival, unknown duration  3. HTN  Signed, Azalee Course PA-C Pager: [phone] Echocardiogram demonstrates moderate left ventricular dysfunction at 40-45% with wall motion abnormalities. He will need an ACE inhibitor following catheterization but will hold off given his renal issues. He might also benefit from a foster own antagonism therapy with the early has to benefit

## 2014-10-22 NOTE — Progress Notes (Signed)
Utilization review completed.  

## 2014-10-22 NOTE — Progress Notes (Signed)
ANTICOAGULATION CONSULT NOTE - Follow Up Consult  Pharmacy Consult for heparin Indication: NSTEMI   Labs:  Recent Labs  10/20/14 2109 10/20/14 2110 10/21/14 0658 10/21/14 0702  10/21/14 1435  10/21/14 1811  10/22/14 0744 10/22/14 1412 10/22/14 2200  HGB  --  15.8  --   --   --   --   --   --   --  14.0  --   --   HCT  --  46.9  --   --   --   --   --   --   --  41.2  --   --   PLT  --  257  --   --   --   --   --   --   --  194  --   --   APTT 28  --   --   --   --   --   --   --   --   --   --   --   LABPROT 13.1  --   --   --   --   --   --   --   --   --   --   --   INR 0.97  --   --   --   --   --   --   --   --   --   --   --   HEPARINUNFRC  --   --   --   --   < >  --   < >  --   < > 0.54 0.25* 0.30  CREATININE  --  1.53*  --  1.25*  --   --   --   --   --   --   --   --   TROPONINI  --  9.78* 5.18*  --   --  3.77*  --  3.85*  --   --   --   --   < > = values in this interval not displayed.    Assessment: 59yo male now therapeutic on heparin after rate increase though just barely within goal range.  Goal of Therapy:  Heparin level 0.3-0.7 units/ml   Plan:  Will increase heparin gtt slightly to 1300 units/hr and check level with am labs.  Vernard Gambles, PharmD, BCPS  10/22/2014,11:02 PM

## 2014-10-22 NOTE — Progress Notes (Addendum)
ANTICOAGULATION CONSULT NOTE - Follow Up Consult  Pharmacy Consult for heparin Indication: NSTEMI   Labs:  Recent Labs  10/20/14 2109 10/20/14 2110 10/21/14 0658 10/21/14 0702 10/21/14 0924 10/21/14 1435 10/21/14 1809 10/21/14 1811 10/22/14 0008  HGB  --  15.8  --   --   --   --   --   --   --   HCT  --  46.9  --   --   --   --   --   --   --   PLT  --  257  --   --   --   --   --   --   --   APTT 28  --   --   --   --   --   --   --   --   LABPROT 13.1  --   --   --   --   --   --   --   --   INR 0.97  --   --   --   --   --   --   --   --   HEPARINUNFRC  --   --   --   --  0.92*  --  0.68  --  0.72*  CREATININE  --  1.53*  --  1.25*  --   --   --   --   --   TROPONINI  --  9.78* 5.18*  --   --  3.77*  --  3.85*  --       Assessment: 59yo male now supratherapeutic on heparin after one level at high end of goal after rate decrease, apparently continues to accumulate.  Goal of Therapy:  Heparin level 0.3-0.7 units/ml   Plan:  Will decrease heparin gtt by 2 units/kg/hr to 1100 units/hr and check level in 6hr.  Vernard Gambles, PharmD, BCPS  10/22/2014,1:09 AM  Addendum: HL this AM is therapeutic at 0.54. No s/s of bleeding reported. Cath planned for Monday. Continue heparin infusion at 1100 units/hr and f/u confirmatory 6 hour level this afternoon.   Vinnie Level, PharmD., BCPS Clinical Pharmacist Pager [phone]

## 2014-10-22 NOTE — Progress Notes (Signed)
ANTICOAGULATION CONSULT NOTE - Follow Up Consult  Pharmacy Consult for heparin Indication: NSTEMI   Labs:  Recent Labs  10/20/14 2109 10/20/14 2110 10/21/14 0658 10/21/14 0702  10/21/14 1435  10/21/14 1811 10/22/14 0008 10/22/14 0744 10/22/14 1412  HGB  --  15.8  --   --   --   --   --   --   --  14.0  --   HCT  --  46.9  --   --   --   --   --   --   --  41.2  --   PLT  --  257  --   --   --   --   --   --   --  194  --   APTT 28  --   --   --   --   --   --   --   --   --   --   LABPROT 13.1  --   --   --   --   --   --   --   --   --   --   INR 0.97  --   --   --   --   --   --   --   --   --   --   HEPARINUNFRC  --   --   --   --   < >  --   < >  --  0.72* 0.54 0.25*  CREATININE  --  1.53*  --  1.25*  --   --   --   --   --   --   --   TROPONINI  --  9.78* 5.18*  --   --  3.77*  --  3.85*  --   --   --   < > = values in this interval not displayed.    Assessment: 59yo male on heparin for ACS at 1100 units/hr now sub-therapeutic at 0.25. No s/s of bleeding reported   Goal of Therapy:  Heparin level 0.3-0.7 units/ml   Plan:  -Give heparin bolus of 1000 units, then increase heparin infusion to 1200 units/hr  -F/u 6 hour HL -Monitor daily CBC, HL and s/s of bleeding  -Cath tomorrow   Vinnie Level, PharmD., BCPS Clinical Pharmacist Pager [phone]

## 2014-10-23 ENCOUNTER — Other Ambulatory Visit: Payer: Self-pay

## 2014-10-23 ENCOUNTER — Encounter (HOSPITAL_COMMUNITY)
Admission: EM | Disposition: A | Discharge status: Home / Self Care | Payer: BLUE CROSS/BLUE SHIELD | Source: Home / Self Care | Attending: Cardiology

## 2014-10-23 ENCOUNTER — Encounter (HOSPITAL_COMMUNITY): Payer: Self-pay | Admitting: General Practice

## 2014-10-23 DIAGNOSIS — N189 Chronic kidney disease, unspecified: Secondary | ICD-10-CM

## 2014-10-23 DIAGNOSIS — I2510 Atherosclerotic heart disease of native coronary artery without angina pectoris: Secondary | ICD-10-CM

## 2014-10-23 DIAGNOSIS — I420 Dilated cardiomyopathy: Secondary | ICD-10-CM | POA: Diagnosis present

## 2014-10-23 DIAGNOSIS — N179 Acute kidney failure, unspecified: Secondary | ICD-10-CM

## 2014-10-23 HISTORY — PX: CORONARY ANGIOPLASTY WITH STENT PLACEMENT: SHX49

## 2014-10-23 HISTORY — PX: CARDIAC CATHETERIZATION: SHX172

## 2014-10-23 HISTORY — DX: Acute kidney failure, unspecified: N17.9

## 2014-10-23 LAB — BASIC METABOLIC PANEL
Anion gap: 9 (ref 5–15)
BUN: 12 mg/dL (ref 6–20)
CO2: 23 mmol/L (ref 22–32)
CREATININE: 1.16 mg/dL (ref 0.61–1.24)
Calcium: 8.6 mg/dL — ABNORMAL LOW (ref 8.9–10.3)
Chloride: 106 mmol/L (ref 101–111)
Glucose, Bld: 106 mg/dL — ABNORMAL HIGH (ref 65–99)
Potassium: 4 mmol/L (ref 3.5–5.1)
Sodium: 138 mmol/L (ref 135–145)

## 2014-10-23 LAB — HEPARIN LEVEL (UNFRACTIONATED): Heparin Unfractionated: 0.42 IU/mL (ref 0.30–0.70)

## 2014-10-23 LAB — CBC
HEMATOCRIT: 40.8 % (ref 39.0–52.0)
Hemoglobin: 13.2 g/dL (ref 13.0–17.0)
MCH: 30.1 pg (ref 26.0–34.0)
MCHC: 32.4 g/dL (ref 30.0–36.0)
MCV: 92.9 fL (ref 78.0–100.0)
PLATELETS: 208 10*3/uL (ref 150–400)
RBC: 4.39 MIL/uL (ref 4.22–5.81)
RDW: 12.9 % (ref 11.5–15.5)
WBC: 8.4 10*3/uL (ref 4.0–10.5)

## 2014-10-23 LAB — POCT ACTIVATED CLOTTING TIME: Activated Clotting Time: 325 seconds

## 2014-10-23 SURGERY — LEFT HEART CATH AND CORONARY ANGIOGRAPHY
Anesthesia: LOCAL

## 2014-10-23 MED ORDER — IOHEXOL 350 MG/ML SOLN
INTRAVENOUS | Status: DC | PRN
  Administered 2014-10-23: 80 mL via INTRA_ARTERIAL

## 2014-10-23 MED ORDER — LIDOCAINE HCL (PF) 1 % IJ SOLN
INTRAMUSCULAR | Status: DC | PRN
  Administered 2014-10-23: 5 mL via SUBCUTANEOUS

## 2014-10-23 MED ORDER — TICAGRELOR 90 MG PO TABS
ORAL_TABLET | ORAL | Status: DC | PRN
  Administered 2014-10-23: 180 mg via ORAL

## 2014-10-23 MED ORDER — FENTANYL CITRATE (PF) 100 MCG/2ML IJ SOLN
INTRAMUSCULAR | Status: DC | PRN
  Administered 2014-10-23 (×2): 25 ug via INTRAVENOUS

## 2014-10-23 MED ORDER — SODIUM CHLORIDE 0.9 % IV SOLN
50000.0000 ug | INTRAVENOUS | Status: DC | PRN
  Administered 2014-10-23: 4 ug/kg/min via INTRAVENOUS

## 2014-10-23 MED ORDER — ASPIRIN 81 MG PO CHEW
81.0000 mg | CHEWABLE_TABLET | Freq: Every day | ORAL | Status: DC
  Administered 2014-10-24: 11:00:00 81 mg via ORAL
  Filled 2014-10-23: qty 1

## 2014-10-23 MED ORDER — CANGRELOR TETRASODIUM 50 MG IV SOLR
INTRAVENOUS | Status: AC
  Filled 2014-10-23: qty 50

## 2014-10-23 MED ORDER — ONDANSETRON HCL 4 MG/2ML IJ SOLN: 4.0000 mg | Freq: Four times a day (QID) | INTRAMUSCULAR | Status: DC | PRN

## 2014-10-23 MED ORDER — HEPARIN SODIUM (PORCINE) 1000 UNIT/ML IJ SOLN
INTRAMUSCULAR | Status: DC | PRN
  Administered 2014-10-23: 5000 [IU] via INTRAVENOUS
  Administered 2014-10-23: 4000 [IU] via INTRAVENOUS

## 2014-10-23 MED ORDER — TICAGRELOR 90 MG PO TABS
90.0000 mg | ORAL_TABLET | Freq: Two times a day (BID) | ORAL | Status: DC
  Administered 2014-10-24 (×2): 90 mg via ORAL
  Filled 2014-10-23 (×2): qty 1

## 2014-10-23 MED ORDER — MIDAZOLAM HCL 2 MG/2ML IJ SOLN
INTRAMUSCULAR | Status: DC | PRN
  Administered 2014-10-23: 2 mg via INTRAVENOUS
  Administered 2014-10-23: 1 mg via INTRAVENOUS

## 2014-10-23 MED ORDER — SODIUM CHLORIDE 0.9 % IJ SOLN: 3.0000 mL | Freq: Two times a day (BID) | INTRAMUSCULAR | Status: DC

## 2014-10-23 MED ORDER — MIDAZOLAM HCL 2 MG/2ML IJ SOLN
INTRAMUSCULAR | Status: AC
  Filled 2014-10-23: qty 2

## 2014-10-23 MED ORDER — VERAPAMIL HCL 2.5 MG/ML IV SOLN
INTRAVENOUS | Status: DC | PRN
  Administered 2014-10-23: 13:00:00 via INTRA_ARTERIAL

## 2014-10-23 MED ORDER — ACETAMINOPHEN 325 MG PO TABS
650.0000 mg | ORAL_TABLET | ORAL | Status: DC | PRN
  Administered 2014-10-23: 19:00:00 650 mg via ORAL
  Filled 2014-10-23: qty 2

## 2014-10-23 MED ORDER — HEPARIN (PORCINE) IN NACL 2-0.9 UNIT/ML-% IJ SOLN
INTRAMUSCULAR | Status: AC
  Filled 2014-10-23: qty 1000

## 2014-10-23 MED ORDER — NITROGLYCERIN 1 MG/10 ML FOR IR/CATH LAB
INTRA_ARTERIAL | Status: AC
  Filled 2014-10-23: qty 10

## 2014-10-23 MED ORDER — SODIUM CHLORIDE 0.9 % IJ SOLN: 3.0000 mL | INTRAMUSCULAR | Status: DC | PRN

## 2014-10-23 MED ORDER — SODIUM CHLORIDE 0.9 % IV SOLN: 250.0000 mL | INTRAVENOUS | Status: DC | PRN

## 2014-10-23 MED ORDER — METOPROLOL TARTRATE 12.5 MG HALF TABLET
12.5000 mg | ORAL_TABLET | Freq: Two times a day (BID) | ORAL | Status: DC
  Administered 2014-10-23 – 2014-10-24 (×2): 12.5 mg via ORAL
  Filled 2014-10-23 (×2): qty 1

## 2014-10-23 MED ORDER — NITROGLYCERIN 1 MG/10 ML FOR IR/CATH LAB
INTRA_ARTERIAL | Status: DC | PRN
  Administered 2014-10-23: 200 ug via INTRACORONARY
  Administered 2014-10-23: 400 ug via INTRA_ARTERIAL

## 2014-10-23 MED ORDER — SODIUM CHLORIDE 0.9 % WEIGHT BASED INFUSION
3.0000 mL/kg/h | INTRAVENOUS | Status: AC
  Administered 2014-10-23: 15:00:00 3 mL/kg/h via INTRAVENOUS

## 2014-10-23 MED ORDER — TICAGRELOR 90 MG PO TABS
ORAL_TABLET | ORAL | Status: AC
  Filled 2014-10-23: qty 2

## 2014-10-23 MED ORDER — FENTANYL CITRATE (PF) 100 MCG/2ML IJ SOLN
INTRAMUSCULAR | Status: AC
  Filled 2014-10-23: qty 2

## 2014-10-23 MED ORDER — LIDOCAINE HCL (PF) 1 % IJ SOLN
INTRAMUSCULAR | Status: AC
  Filled 2014-10-23: qty 30

## 2014-10-23 MED ORDER — ANGIOPLASTY BOOK
Freq: Once | Status: AC
  Administered 2014-10-23: 21:00:00
  Filled 2014-10-23: qty 1

## 2014-10-23 MED ORDER — HEPARIN SODIUM (PORCINE) 1000 UNIT/ML IJ SOLN
INTRAMUSCULAR | Status: AC
  Filled 2014-10-23: qty 1

## 2014-10-23 MED ORDER — VERAPAMIL HCL 2.5 MG/ML IV SOLN
INTRAVENOUS | Status: AC
  Filled 2014-10-23: qty 2

## 2014-10-23 MED ORDER — CANGRELOR BOLUS VIA INFUSION
INTRAVENOUS | Status: DC | PRN
  Administered 2014-10-23: 2493 ug via INTRAVENOUS

## 2014-10-23 SURGICAL SUPPLY — 23 items
BALLN EMERGE MR 2.0X15 (BALLOONS) ×2
BALLN ~~LOC~~ TREK RX 2.75X12 (BALLOONS) ×2
BALLOON EMERGE MR 2.0X15 (BALLOONS) ×1 IMPLANT
BALLOON ~~LOC~~ TREK RX 2.75X12 (BALLOONS) ×1 IMPLANT
CATH INFINITI 5 FR JL3.5 (CATHETERS) ×2 IMPLANT
CATH INFINITI 5FR ANG PIGTAIL (CATHETERS) ×2 IMPLANT
CATH INFINITI 5FR MULTPACK ANG (CATHETERS) IMPLANT
CATH INFINITI JR4 5F (CATHETERS) ×2 IMPLANT
DEVICE RAD COMP TR BAND LRG (VASCULAR PRODUCTS) ×2 IMPLANT
GLIDESHEATH SLEND SS 6F .021 (SHEATH) ×2 IMPLANT
GUIDE CATH RUNWAY 6FR CLS3 (CATHETERS) ×2 IMPLANT
KIT ENCORE 26 ADVANTAGE (KITS) ×2 IMPLANT
KIT HEART LEFT (KITS) ×2 IMPLANT
PACK CARDIAC CATHETERIZATION (CUSTOM PROCEDURE TRAY) ×2 IMPLANT
SHEATH PINNACLE 5F 10CM (SHEATH) IMPLANT
STENT SYNERGY DES 2.5X20 (Permanent Stent) ×2 IMPLANT
SYR MEDRAD MARK V 150ML (SYRINGE) ×2 IMPLANT
TRANSDUCER W/STOPCOCK (MISCELLANEOUS) ×2 IMPLANT
TUBING CIL FLEX 10 FLL-RA (TUBING) ×2 IMPLANT
VALVE GUARDIAN II ~~LOC~~ HEMO (MISCELLANEOUS) ×2 IMPLANT
WIRE ASAHI PROWATER 180CM (WIRE) ×2 IMPLANT
WIRE EMERALD 3MM-J .035X150CM (WIRE) IMPLANT
WIRE SAFE-T 1.5MM-J .035X260CM (WIRE) ×2 IMPLANT

## 2014-10-23 NOTE — Care Management Note (Signed)
Case Management Note  Patient Details  Name: Lee Taylor MRN: [id number] Date of Birth: January 19, 1956  Subjective/Objective:                 Pt from home admitted with chest pain.   Action/Plan:  Return to home when medically stable. CM  To f/u with d/c needs. Expected Discharge Date:                  Expected Discharge Plan:     In-House Referral:     Discharge planning Services  CM Consult  Post Acute Care Choice:    Choice offered to:     DME Arranged:    DME Agency:     HH Arranged:    HH Agency:     Status of Service:  In process, will continue to follow  Medicare Important Message Given:    Date Medicare IM Given:    Medicare IM give by:    Date Additional Medicare IM Given:    Additional Medicare Important Message give by:     If discussed at Long Length of Stay Meetings, dates discussed:    Additional Comments: CM provided pt with [employer]il[employer] pamphlet with enclosed 30 dayfree card and copay card. Pt uses [facility]([phone]), CM called to confirm medication  Is in stock, pt made aware. Benefit check in process,  CM will f/u with results when available.   Gae Gallop [place], [place] 10/23/2014, 2:49 PM [phone]

## 2014-10-23 NOTE — Interval H&P Note (Signed)
Cath Lab Visit (complete for each Cath Lab visit)  Clinical Evaluation Leading to the Procedure:   ACS: Yes.    Non-ACS:    Anginal Classification: CCS IV  Anti-ischemic medical therapy: Minimal Therapy (1 class of medications)  Non-Invasive Test Results: No non-invasive testing performed  Prior CABG: No previous CABG  TIMI Score  Patient Information:  TIMI Score is 3   UA/NSTEMI and intermediate-risk features (e.g., TIMI score 3-4) for short-term risk of death or nonfatal MI  Revascularization of the presumed culprit artery   A (9)  Indication: 10; Score: 9     History and Physical Interval Note:  10/23/2014 12:24 PM  Lee Taylor  has presented today for surgery, with the diagnosis of NSTEMI  The various methods of treatment have been discussed with the patient and family. After consideration of risks, benefits and other options for treatment, the patient has consented to  Procedure(s): Left Heart Cath and Coronary Angiography (N/A) as a surgical intervention .  The patient's history has been reviewed, patient examined, no change in status, stable for surgery.  I have reviewed the patient's chart and labs.  Questions were answered to the patient's satisfaction.     Detta Mellin S.

## 2014-10-23 NOTE — H&P (View-Only) (Signed)
 Patient Name: Lee Taylor Date of Encounter: 10/23/2014  Active Problems:   NSTEMI (non-ST elevated myocardial infarction)   ACS (acute coronary syndrome)    SUBJECTIVE  Patient is a big friend of [employer]. He got excited last night when [employer] won championship last night and felt left sided chest pressures/palpitation. At time tele showed sinus tech at rate of 140s. No episode since than. Currently denies chest pain, SOB or palpitation.   CURRENT MEDS . aspirin  81 mg Oral Daily  . atorvastatin  80 mg Oral q1800  . famotidine  20 mg Oral BID  . metoprolol tartrate  12.5 mg Oral BID  . sodium chloride  3 mL Intravenous Q12H    OBJECTIVE  Filed Vitals:   10/22/14 0925 10/22/14 1348 10/22/14 2114 10/23/14 0435  BP: 122/69 116/77 140/86 114/74  Pulse: 80 78 87 86  Temp:  99.1 F (37.3 C) 98.4 F (36.9 C) 98 F (36.7 C)  TempSrc:  Oral Oral Oral  Resp:  20 20 20  Height:      Weight:    183 lb 3.4 oz (83.104 kg)  SpO2:  95% 96% 96%    Intake/Output Summary (Last 24 hours) at 10/23/14 0751 Last data filed at 10/23/14 0550  Gross per 24 hour  Intake 1645.29 ml  Output   2100 ml  Net -454.71 ml   Filed Weights   10/21/14 0135 10/22/14 0546 10/23/14 0435  Weight: 183 lb 14.4 oz (83.416 kg) 183 lb 3.2 oz (83.099 kg) 183 lb 3.4 oz (83.104 kg)    PHYSICAL EXAM  General: Pleasant, NAD. Neuro: Alert and oriented X 3. Moves all extremities spontaneously. Psych: Normal affect. HEENT:  Normal  Neck: Supple without bruits or JVD. Lungs:  Resp regular and unlabored, CTA. Heart: RRR no s3, s4, or murmurs. Abdomen: Soft, non-tender, non-distended, BS + x 4.  Extremities: No clubbing, cyanosis or edema. DP/PT/Radials 2+ and equal bilaterally.  Accessory Clinical Findings  CBC  Recent Labs  10/22/14 0744 10/23/14 0534  WBC 8.0 8.4  HGB 14.0 13.2  HCT 41.2 40.8  MCV 92.4 92.9  PLT 194 208   Basic Metabolic Panel  Recent Labs  10/21/14 0702 10/23/14 0534    NA 139 138  K 4.6 4.0  CL 107 106  CO2 24 23  GLUCOSE 113* 106*  BUN 21* 12  CREATININE 1.25* 1.16  CALCIUM 8.8* 8.6*     Recent Labs  10/21/14 0658 10/21/14 1435 10/21/14 1811  TROPONINI 5.18* 3.77* 3.85*    TELE  NSR at rate of 70-80s. Sinus tach last night.   Echo 10/21/14 LV EF: 40% -  45%  ------------------------------------------------------------------- Indications:   Chest pain 786.51.  ------------------------------------------------------------------- History:  Risk factors: NSTEMI. Elevated troponin.  ------------------------------------------------------------------- Study Conclusions  - Left ventricle: The cavity size was normal. Wall thickness was normal. Systolic function was mildly to moderately reduced. The estimated ejection fraction was in the range of 40% to 45%. There is akinesis of the mid-apicalanteroseptal, lateral, and apical myocardium. Doppler parameters are consistent with abnormal left ventricular relaxation (grade 1 diastolic dysfunction). - Inferior vena cava: The vessel was dilated. The respirophasic diameter changes were blunted (< 50%), consistent with elevated central venous pressure. Radiology/Studies  Dg Chest 2 View  10/20/2014   CLINICAL DATA:  Cough. Congestion. Upper respiratory infection for 2 weeks. Chest tightness. Shortness of breath.  EXAM: CHEST  2 VIEW  COMPARISON:  None.  FINDINGS: Thoracic spondylosis.  The lungs appear clear. Cardiac   and mediastinal contours normal. No pleural effusion identified.  IMPRESSION: Thoracic spondylosis.  Otherwise negative.   Electronically Signed   By: Walter  Liebkemann M.D.   On: 10/20/2014 15:49    ASSESSMENT AND PLAN  1. NSTEMI - on lisinopril at home, currently on hold. Metoprolol started  - peak of trop was 9.78, now trending down. Cath today 1330 - EKG showed Q waves in inferolateral leads, poor R wave progression in anterior lead, no prior EKG to  compare to - Pre cath EKG today showed TWI throughout, will discuss with MD. May needs cath sooner rather than later. Currently asymptomatic - Continue ASA, lipitor and metoprolol, heparin - Echo showed LV EF of 40-45%, akinesis of themid-apicalanteroseptal, lateral, and apical myocardium; grade 1 DD; elevated central venous pressure - pending HgbA1c and lipid profile  2. Acute vs chronic renal insufficiency - holding off ACEI, on IV hydration - Cr 1.53 on arrival, unknown duration. Creatinine improved, Creatinine today - Consider start ACEI post cath  3. HTN - Stable and well controlled   Signed, Tianne Plott PA-C Pager [phone]   

## 2014-10-23 NOTE — Progress Notes (Signed)
Patient Name: Lee Taylor Date of Encounter: 10/23/2014  Active Problems:   NSTEMI (non-ST elevated myocardial infarction)   ACS (acute coronary syndrome)    SUBJECTIVE  Patient is a big friend of [employer]. He got excited last night when [employer] won championship last night and felt left sided chest pressures/palpitation. At time tele showed sinus tech at rate of 140s. No episode since than. Currently denies chest pain, SOB or palpitation.   CURRENT MEDS . aspirin  81 mg Oral Daily  . atorvastatin  80 mg Oral q1800  . famotidine  20 mg Oral BID  . metoprolol tartrate  12.5 mg Oral BID  . sodium chloride  3 mL Intravenous Q12H    OBJECTIVE  Filed Vitals:   10/22/14 0925 10/22/14 1348 10/22/14 2114 10/23/14 0435  BP: 122/69 116/77 140/86 114/74  Pulse: 80 78 87 86  Temp:  99.1 F (37.3 C) 98.4 F (36.9 C) 98 F (36.7 C)  TempSrc:  Oral Oral Oral  Resp:  Height:      Weight:    183 lb 3.4 oz (83.104 kg)  SpO2:  95% 96% 96%    Intake/Output Summary (Last 24 hours) at 10/23/14 0751 Last data filed at 10/23/14 0550  Gross per 24 hour  Intake 1645.29 ml  Output   2100 ml  Net -454.71 ml   Filed Weights   10/21/14 0135 10/22/14 0546 10/23/14 0435  Weight: 183 lb 14.4 oz (83.416 kg) 183 lb 3.2 oz (83.099 kg) 183 lb 3.4 oz (83.104 kg)    PHYSICAL EXAM  General: Pleasant, NAD. Neuro: Alert and oriented X 3. Moves all extremities spontaneously. Psych: Normal affect. HEENT:  Normal  Neck: Supple without bruits or JVD. Lungs:  Resp regular and unlabored, CTA. Heart: RRR no s3, s4, or murmurs. Abdomen: Soft, non-tender, non-distended, BS + x 4.  Extremities: No clubbing, cyanosis or edema. DP/PT/Radials 2+ and equal bilaterally.  Accessory Clinical Findings  CBC  Recent Labs  10/22/14 0744 10/23/14 0534  WBC 8.0 8.4  HGB 14.0 13.2  HCT 41.2 40.8  MCV 92.4 92.9  PLT 194 208   Basic Metabolic Panel  Recent Labs  10/21/14 0702 10/23/14 0534    NA 139 138  K 4.6 4.0  CL 107 106  CO2 24 23  GLUCOSE 113* 106*  BUN 21* 12  CREATININE 1.25* 1.16  CALCIUM 8.8* 8.6*     Recent Labs  10/21/14 0658 10/21/14 1435 10/21/14 1811  TROPONINI 5.18* 3.77* 3.85*    TELE  NSR at rate of 70-80s. Sinus tach last night.   Echo 10/21/14 LV EF: 40% -  45%  ------------------------------------------------------------------- Indications:   Chest pain 786.51.  ------------------------------------------------------------------- History:  Risk factors: NSTEMI. Elevated troponin.  ------------------------------------------------------------------- Study Conclusions  - Left ventricle: The cavity size was normal. Wall thickness was normal. Systolic function was mildly to moderately reduced. The estimated ejection fraction was in the range of 40% to 45%. There is akinesis of the mid-apicalanteroseptal, lateral, and apical myocardium. Doppler parameters are consistent with abnormal left ventricular relaxation (grade 1 diastolic dysfunction). - Inferior vena cava: The vessel was dilated. The respirophasic diameter changes were blunted (< 50%), consistent with elevated central venous pressure. Radiology/Studies  Dg Chest 2 View  10/20/2014   CLINICAL DATA:  Cough. Congestion. Upper respiratory infection for 2 weeks. Chest tightness. Shortness of breath.  EXAM: CHEST  2 VIEW  COMPARISON:  None.  FINDINGS: Thoracic spondylosis.  The lungs appear clear. Cardiac  and mediastinal contours normal. No pleural effusion identified.  IMPRESSION: Thoracic spondylosis.  Otherwise negative.   Electronically Signed   By: Gaylyn Rong M.D.   On: 10/20/2014 15:49    ASSESSMENT AND PLAN  1. NSTEMI - on lisinopril at home, currently on hold. Metoprolol started  - peak of trop was 9.78, now trending down. Cath today 1330 - EKG showed Q waves in inferolateral leads, poor R wave progression in anterior lead, no prior EKG to  compare to - Pre cath EKG today showed TWI throughout, will discuss with MD. May needs cath sooner rather than later. Currently asymptomatic - Continue ASA, lipitor and metoprolol, heparin - Echo showed LV EF of 40-45%, akinesis of themid-apicalanteroseptal, lateral, and apical myocardium; grade 1 DD; elevated central venous pressure - pending HgbA1c and lipid profile  2. Acute vs chronic renal insufficiency - holding off ACEI, on IV hydration - Cr 1.53 on arrival, unknown duration. Creatinine improved, Creatinine today - Consider start ACEI post cath  3. HTN - Stable and well controlled   Signed, Halsey Hammen PA-C Pager [phone]

## 2014-10-23 NOTE — Progress Notes (Signed)
TR BAND REMOVAL  LOCATION:  right radial  DEFLATED PER PROTOCOL:  Yes.    TIME BAND OFF / DRESSING APPLIED:   1800   SITE UPON ARRIVAL:   Level 0  SITE AFTER BAND REMOVAL:  Level 0  REVERSE ALLEN'S TEST:    positive  CIRCULATION SENSATION AND MOVEMENT:  Within Normal Limits  Yes.    COMMENTS:  Deflated slowly secondary to ooze at site.

## 2014-10-23 NOTE — Progress Notes (Signed)
ANTICOAGULATION CONSULT NOTE - Follow Up Consult  Pharmacy Consult for Heparin Indication: chest pain/ACS  Allergies  Allergen Reactions  . Amoxicillin     Patient Measurements: Height: 5\' 9"  (175.3 cm) Weight: 183 lb 3.4 oz (83.104 kg) IBW/kg (Calculated) : 70.7 Heparin Dosing Weight:  83.1 kg  Vital Signs: Temp: 98 F (36.7 C) (06/20 0435) Temp Source: Oral (06/20 0435) BP: 114/74 mmHg (06/20 0435) Pulse Rate: 86 (06/20 0435)  Labs:  Recent Labs  10/20/14 2109  10/20/14 2110 10/21/14 0658 10/21/14 0702  10/21/14 1435  10/21/14 1811  10/22/14 0744 10/22/14 1412 10/22/14 2200 10/23/14 0534  HGB  --   < > 15.8  --   --   --   --   --   --   --  14.0  --   --  13.2  HCT  --   --  46.9  --   --   --   --   --   --   --  41.2  --   --  40.8  PLT  --   --  257  --   --   --   --   --   --   --  194  --   --  208  APTT 28  --   --   --   --   --   --   --   --   --   --   --   --   --   LABPROT 13.1  --   --   --   --   --   --   --   --   --   --   --   --   --   INR 0.97  --   --   --   --   --   --   --   --   --   --   --   --   --   HEPARINUNFRC  --   --   --   --   --   < >  --   < >  --   < > 0.54 0.25* 0.30 0.42  CREATININE  --   --  1.53*  --  1.25*  --   --   --   --   --   --   --   --  1.16  TROPONINI  --   --  9.78* 5.18*  --   --  3.77*  --  3.85*  --   --   --   --   --   < > = values in this interval not displayed.  Estimated Creatinine Clearance: 68.6 mL/min (by C-G formula based on Cr of 1.16).   Assessment: 59 year old male with chest pain and SOB. He saw his MD this afternoon and was sent home.  He was asked to come to the ED when his troponin came back positive.  Pharmacy now to dose heparin  Anticoagulation: CP, elevated troponins. Heparin level 0.42 remains in goal.  CBC remains WNL  Goal of Therapy:  Heparin level 0.3-0.7 units/ml Monitor platelets by anticoagulation protocol: Yes   Plan:  Continue IV heparin at 1300 units/r Will f/u  after cath   Clemie General S. Merilynn Finland, PharmD, BCPS Clinical Staff Pharmacist Pager [phone]  Misty Stanley Stillinger 10/23/2014,11:44 AM

## 2014-10-24 ENCOUNTER — Encounter (HOSPITAL_COMMUNITY): Payer: Self-pay | Admitting: Interventional Cardiology

## 2014-10-24 DIAGNOSIS — I10 Essential (primary) hypertension: Secondary | ICD-10-CM

## 2014-10-24 LAB — BASIC METABOLIC PANEL
ANION GAP: 7 (ref 5–15)
BUN: 15 mg/dL (ref 6–20)
CO2: 23 mmol/L (ref 22–32)
CREATININE: 1.09 mg/dL (ref 0.61–1.24)
Calcium: 8.4 mg/dL — ABNORMAL LOW (ref 8.9–10.3)
Chloride: 109 mmol/L (ref 101–111)
GFR calc Af Amer: >60 mL/min (ref >60)
GFR calc non Af Amer: >60 mL/min (ref >60)
GLUCOSE: 96 mg/dL (ref 65–99)
POTASSIUM: 4 mmol/L (ref 3.5–5.1)
Sodium: 139 mmol/L (ref 135–145)

## 2014-10-24 LAB — CBC
HEMATOCRIT: 42.8 % (ref 39.0–52.0)
Hemoglobin: 14.8 g/dL (ref 13.0–17.0)
MCH: 31.8 pg (ref 26.0–34.0)
MCHC: 34.6 g/dL (ref 30.0–36.0)
MCV: 91.8 fL (ref 78.0–100.0)
Platelets: 249 10*3/uL (ref 150–400)
RBC: 4.66 MIL/uL (ref 4.22–5.81)
RDW: 13.1 % (ref 11.5–15.5)
WBC: 8.3 10*3/uL (ref 4.0–10.5)

## 2014-10-24 LAB — LIPID PANEL
Cholesterol: 123 mg/dL (ref 0–200)
HDL: 39 mg/dL — ABNORMAL LOW (ref >40)
LDL CALC: 69 mg/dL (ref 0–99)
Total CHOL/HDL Ratio: 3.2 RATIO
Triglycerides: 74 mg/dL (ref <150)
VLDL: 15 mg/dL (ref 0–40)

## 2014-10-24 LAB — HEPATIC FUNCTION PANEL
ALK PHOS: 60 U/L (ref 38–126)
ALT: 32 U/L (ref 17–63)
AST: 30 U/L (ref 15–41)
Albumin: 3.1 g/dL — ABNORMAL LOW (ref 3.5–5.0)
BILIRUBIN TOTAL: 0.8 mg/dL (ref 0.3–1.2)
Bilirubin, Direct: <0.1 mg/dL — ABNORMAL LOW (ref 0.1–0.5)
TOTAL PROTEIN: 5.4 g/dL — AB (ref 6.5–8.1)

## 2014-10-24 MED ORDER — TICAGRELOR 90 MG PO TABS: 90.0000 mg | ORAL_TABLET | Freq: Two times a day (BID) | ORAL | Status: DC

## 2014-10-24 MED ORDER — ATORVASTATIN CALCIUM 80 MG PO TABS: 80.0000 mg | ORAL_TABLET | Freq: Every day | ORAL | Status: DC

## 2014-10-24 MED ORDER — METOPROLOL TARTRATE 25 MG PO TABS: 12.5000 mg | ORAL_TABLET | Freq: Two times a day (BID) | ORAL | Status: DC

## 2014-10-24 MED ORDER — NITROGLYCERIN 0.4 MG SL SUBL: 0.4000 mg | SUBLINGUAL_TABLET | SUBLINGUAL | Status: DC | PRN

## 2014-10-24 MED FILL — Heparin Sodium (Porcine) 2 Unit/ML in Sodium Chloride 0.9%: INTRAMUSCULAR | Qty: 1000 | Status: AC

## 2014-10-24 NOTE — Discharge Instructions (Signed)
PLEASE REMEMBER TO BRING ALL OF YOUR MEDICATIONS TO EACH OF YOUR FOLLOW-UP OFFICE VISITS. ° °PLEASE ATTEND ALL SCHEDULED FOLLOW-UP APPOINTMENTS.  ° °Activity: Increase activity slowly as tolerated. You may shower, but no soaking baths (or swimming) for 1 week. No driving for 1 week. No lifting over 5 lbs for 2 weeks. No sexual activity for 1 week.  ° °You May Return to Work: in 3 weeks (if applicable) ° °Wound Care: You may wash cath site gently with soap and water. Keep cath site clean and dry. If you notice pain, swelling, bleeding or pus at your cath site, please call [phone]. ° ° ° °Cardiac Cath Site Care °Refer to this sheet in the next few weeks. These instructions provide you with information on caring for yourself after your procedure. Your caregiver may also give you more specific instructions. Your treatment has been planned according to current medical practices, but problems sometimes occur. Call your caregiver if you have any problems or questions after your procedure. °HOME CARE INSTRUCTIONS °· You may shower 24 hours after the procedure. Remove the bandage (dressing) and gently wash the site with plain soap and water. Gently pat the site dry.  °· Do not apply powder or lotion to the site.  °· Do not sit in a bathtub, swimming pool, or whirlpool for 5 to 7 days.  °· No bending, squatting, or lifting anything over 10 pounds (4.5 kg) as directed by your caregiver.  °· Inspect the site at least twice daily.  °· Do not drive home if you are discharged the same day of the procedure. Have someone else drive you.  °· You may drive 24 hours after the procedure unless otherwise instructed by your caregiver.  °What to expect: °· Any bruising will usually fade within 1 to 2 weeks.  °· Blood that collects in the tissue (hematoma) may be painful to the touch. It should usually decrease in size and tenderness within 1 to 2 weeks.  °SEEK IMMEDIATE MEDICAL CARE IF: °· You have unusual pain at the site or down the  affected limb.  °· You have redness, warmth, swelling, or pain at the site.  °· You have drainage (other than a small amount of blood on the dressing).  °· You have chills.  °· You have a fever or persistent symptoms for more than 72 hours.  °· You have a fever and your symptoms suddenly get worse.  °· Your leg becomes pale, cool, tingly, or numb.  °· You have heavy bleeding from the site. Hold pressure on the site.  °Document Released: 05/24/2010 Document Revised: 04/10/2011 Document Reviewed:  ° °

## 2014-10-24 NOTE — Progress Notes (Signed)
CARDIAC REHAB PHASE I   PRE:  Rate/Rhythm: 86 SR  BP:  Sitting: 142/79        SaO2: 95 RA  MODE:  Ambulation: 1000 ft   POST:  Rate/Rhythm: 92 SR  BP:  Sitting: 145/80         SaO2: 98 RA  Pt ambulated 1000 ft on RA, independent, brisk steady gait, tolerated well.  Pt denies cp, dizziness, DOE, declined rest stop. Completed MI/stent education with wife at bedside.  Reviewed risk factors, anti-platelet therapy, stent card, activity restrictions, ntg, exercise, heart healthy diet, portion control,daily weights, sodium and fluid restrictions, and phase 2 cardiac rehab. Pt verbalized understanding. Pt declines phase 2 cardiac rehab at this time due to work schedule restraints. Pt to bed after walk per pt request, call bell within reach.   [phone]   Joylene Grapes, RN, BSN 10/24/2014 9:10 AM

## 2014-10-24 NOTE — Discharge Summary (Signed)
CARDIOLOGY DISCHARGE SUMMARY   Patient ID: Lee Taylor MRN: [id number] DOB/AGE: March 29, 1956 59 y.o.  Admit date: 10/20/2014 Discharge date: 10/24/2014  PCP: Beatrice Lecher, MD Primary Cardiologist: Dr. Stanford Breed (patient request f/u in [place])  Primary Discharge Diagnosis:  Non-STEMI, s/p 2.5 x 20 Synergy DES to the LAD Secondary Discharge Diagnosis:  Active Problems:   Essential hypertension, benign   ACS (acute coronary syndrome)   DCM (dilated cardiomyopathy)  Procedures: Cardiac catheterization, coronary arteriogram, PTCA and drug-eluting stent to the LAD, 2-D echocardiogram  Hospital Course: Ajeet Casasola is a 59 y.o. male with a history of HTN and GERD. He was watching Game 7 of the [facility] finals when he had onset of chest pain. When his symptoms did not resolve, he came to the hospital. His troponin was elevated and he was admitted for further evaluation and treatment.  He was pain-free on aspirin, heparin, nitrates, beta blocker and statin. His creatinine was noted to be elevated, so his lisinopril was held and he was hydrated. His creatinine improved.   He was taken to the cath lab on 10/23/2014, results are below.  He had a drug-eluting stent from the LAD, reducing a 95% stenosis to 0. He tolerated the procedure well.  He was seen by cardiac rehabilitation and educated on MI restrictions, heart-healthy lifestyle modifications and exercise guidelines. It is hoped he will follow-up with them as an outpatient.  On 10/24/2014, he was seen by Dr. Beau Fanny and all data were reviewed. He was ambulating without chest pain or shortness of breath. His post procedure labs were stable and his renal function was within normal limits. No further inpatient workup is indicated and he is considered stable for discharge, to follow up as an outpatient.   BP 142/79 mmHg  Pulse 77  Temp(Src) 98.1 F (36.7 C) (Oral)  Resp 19  Ht _0  (1.753 m)  Wt 184 lb 8.4 oz (83.7 kg)   BMI 27.24 kg/m2  SpO2 96% .General: Well developed male in no acute distress.   Head: Normocephalic, atraumatic, sclera non-icteric, no xanthomas, nares are without discharge. Neck: Negative for carotid bruits. JVP not elevated. Lungs: Clear bilaterally to auscultation without wheezes, rales, or rhonchi. Breathing is unlabored. Heart: RRR S1 S2 without murmurs, rubs, or gallops.  Abdomen: Soft, non-tender, non-distended with normoactive bowel sounds. No rebound/guarding. Extremities: No clubbing or cyanosis. No edema. Distal pedal pulses are 2+ and equal bilaterally. Right radial cath site without ecchymosis or hematoma Neuro: Alert and oriented X 3. Moves all extremities spontaneously. Psych: Responds to questions appropriately with a normal affect.  Labs:  Lab Results  Component Value Date   WBC 8.3 10/24/2014   HGB 14.8 10/24/2014   HCT 42.8 10/24/2014   MCV 91.8 10/24/2014   PLT 249 10/24/2014     Recent Labs Lab 10/24/14 0415  NA 139  K 4.0  CL 109  CO2 23  BUN 15  CREATININE 1.09  CALCIUM 8.4*  PROT 5.4*  BILITOT 0.8  ALKPHOS 60  ALT 32  AST 30  GLUCOSE 96   TROPONIN I  Date Value Ref Range Status  10/21/2014 3.85* <0.031 ng/mL Final  10/21/2014 3.77* <0.031 ng/mL Final  10/21/2014 5.18* <0.031 ng/mL Final  10/20/2014 9.78* <0.031 ng/mL Final  10/20/2014 4.91* <0.06 ng/mL Final   Lipid Panel     Component Value Date/Time   CHOL 123 10/24/2014 0415   TRIG 74 10/24/2014 0415   HDL 39* 10/24/2014 0415   CHOLHDL 3.2 10/24/2014 0415  VLDL 15 10/24/2014 0415   LDLCALC 69 10/24/2014 0415   No results found for: HGBA1C     Radiology: Dg Chest 2 View 10/20/2014   CLINICAL DATA:  Cough. Congestion. Upper respiratory infection for 2 weeks. Chest tightness. Shortness of breath.  EXAM: CHEST  2 VIEW  COMPARISON:  None.  FINDINGS: Thoracic spondylosis.  The lungs appear clear. Cardiac and mediastinal contours normal. No pleural effusion identified.  IMPRESSION:  Thoracic spondylosis.  Otherwise negative.   Electronically Signed   By: Van Clines M.D.   On: 10/20/2014 15:49    Cardiac Cath: 10/23/2014 Conclusion     95% mid LAD lesion which was the culprit for the patient's non-STEMI. This was successfully treated with a drug-eluting stent, 2.5 x 20 Synergy, postdilated to 2.8 mm in diameter. The patient will be watched overnight. Continue dual antiplatelets therapy for at least a year along with aggressive secondary prevention.  Possible discharge for tomorrow from the hospital.     Coronary Findings    Dominance: Co-dominant   Left Anterior Descending   . Mid LAD-1 lesion, 25% stenosed.   . Mid LAD-2 lesion, 95% stenosed. thrombotic .   [name] PCI: The pre-interventional distal flow is normal (TIMI 3). Pre-stent angioplasty was performed. A drug-eluting stent was placed. The strut is apposed. Post-stent angioplasty was performed. Maximum pressure: 18 atm. The post-interventional distal flow is normal (TIMI 3). The intervention was successful. No complications occurred at this lesion.  . Supplies used: STENT SYNERGY DES 2.5X20; BALLOON [place]umont [id number]  . There is no residual stenosis post intervention.     Jorene Minors LAD lesion, 25% stenosed.     Ramus Intermedius  The vessel is small .     Left Circumflex   . First Obtuse Marginal Branch   The vessel is small in size.   [name] Second Obtuse Marginal Branch   The vessel is large in size.   . Third Obtuse Marginal Branch   The vessel is small in size.     Right Coronary Artery   . Mid RCA lesion, 20% stenosed.      Left Heart    Aortic Valve There is no aortic valve stenosis.    EKG: 24-Oct-2014 03:49:14   Normal sinus rhythm Incomplete right bundle branch block Left anterior fascicular block Marked T wave abnormality, inversions in inferior and lateral leads Prolonged QT Vent. rate 63 BPM PR interval 154 ms QRS duration 94 ms QT/QTc 460/470 ms P-R-T axes 39 -48  261  Echo: 10/22/2014 Conclusions - Left ventricle: The cavity size was normal. Wall thickness was normal. Systolic function was mildly to moderately reduced. The estimated ejection fraction was in the range of 40% to 45%. There is akinesis of the mid-apicalanteroseptal, lateral, and apical myocardium. Doppler parameters are consistent with abnormal left ventricular relaxation (grade 1 diastolic dysfunction). - Inferior vena cava: The vessel was dilated. The respirophasic diameter changes were blunted (< 50%), consistent with elevated central venous pressure.  FOLLOW UP PLANS AND APPOINTMENTS Allergies  Allergen Reactions  . Amoxicillin      Medication List    TAKE these medications        aspirin 81 MG tablet  Take 81 mg by mouth daily.     atorvastatin 80 MG tablet  Commonly known as:  LIPITOR  Take 1 tablet (80 mg total) by mouth daily at 6 PM.     azithromycin 250 MG tablet  Commonly known as:  ZITHROMAX Z-PAK  Take  2 tabs today; then begin one tab once daily for 4 more days.     benzonatate 200 MG capsule  Commonly known as:  TESSALON  Take 1 capsule (200 mg total) by mouth at bedtime. Take as needed for cough     Calcium Carb-Cholecalciferol 600-800 MG-UNIT Tabs  Take 1 tablet by mouth daily.     CINNAMON PO  Take 1 tablet by mouth daily.     Ginkgo Biloba 40 MG Tabs  Take 60 mg by mouth daily.     lisinopril 20 MG tablet  Commonly known as:  PRINIVIL,ZESTRIL  TAKE ONE TABLET BY MOUTH ONCE DAILY.     metoprolol tartrate 25 MG tablet  Commonly known as:  LOPRESSOR  Take 0.5 tablets (12.5 mg total) by mouth 2 (two) times daily.     multivitamin capsule  Take 1 capsule by mouth daily.     nitroGLYCERIN 0.4 MG SL tablet  Commonly known as:  NITROSTAT  Place 1 tablet (0.4 mg total) under the tongue every 5 (five) minutes as needed for chest pain.     OCUVITE PO  Take 1 tablet by mouth daily.     polycarbophil 625 MG tablet  Commonly  known as:  FIBERCON  Take 625 mg by mouth daily.     ranitidine 150 MG tablet  Commonly known as:  ZANTAC  Take 150 mg by mouth 2 (two) times daily.     ticagrelor 90 MG Tabs tablet  Commonly known as:  BRILINTA  Take 1 tablet (90 mg total) by mouth 2 (two) times daily.     vitamin C 500 MG tablet  Commonly known as:  ASCORBIC ACID  Take 500 mg by mouth daily.        Discharge Instructions    Diet - low sodium heart healthy    Complete by:  As directed      Increase activity slowly    Complete by:  As directed           Follow-up Information    Follow up with Erlene Quan, PA-C On 11/01/2014.   Specialties:  Cardiology, Radiology   Why:  See provider at 9:30 AM, please arrive 15 minutes early for paperwork   Contact information:   [place] [address] [phone]       Follow up with Kirk Ruths, MD.   Specialty:  Cardiology   Why:  See in 3 months in [place].    Contact information:   [address] [place] [address] [phone]       BRING ALL MEDICATIONS WITH YOU TO FOLLOW UP APPOINTMENTS  Time spent with patient to include physician time: 41 min Signed: Rosaria Ferries, PA-C 10/24/2014, 11:55 AM Co-Sign MD  I have examined the patient and reviewed assessment and plan and discussed with patient.  Agree with above as stated.  Status post LAD stent for non-STEMI. He did well overnight. No bleeding at the radial site. He has some mild soreness there but the pulses intact. No chest discomfort or shortness of breath. I stressed the importance of dual antiplatelets therapy. I encouraged cardiac rehabilitation. Continue aggressive secondary prevention. He will follow-up in the [place] office.  Jamori Biggar S.

## 2014-10-25 ENCOUNTER — Telehealth: Payer: Self-pay | Admitting: Cardiology

## 2014-10-25 LAB — HEMOGLOBIN A1C
Hgb A1c MFr Bld: 5.6 % (ref 4.8–5.6)
MEAN PLASMA GLUCOSE: 114 mg/dL

## 2014-10-25 NOTE — Telephone Encounter (Signed)
LEFT MESSAGE TO CALL BACK

## 2014-10-25 NOTE — Telephone Encounter (Signed)
Pt needs a post hospital f/u call He will be seeing Franky Macho on 6/29 at 9:30am  Thanks

## 2014-10-26 NOTE — Telephone Encounter (Signed)
Left message on home number (preferred number) to call back.

## 2014-11-01 ENCOUNTER — Other Ambulatory Visit: Payer: Self-pay

## 2014-11-01 ENCOUNTER — Ambulatory Visit (INDEPENDENT_AMBULATORY_CARE_PROVIDER_SITE_OTHER): Payer: BLUE CROSS/BLUE SHIELD | Admitting: Cardiology

## 2014-11-01 ENCOUNTER — Encounter: Payer: Self-pay | Admitting: Cardiology

## 2014-11-01 ENCOUNTER — Ambulatory Visit (HOSPITAL_COMMUNITY): Admission: AD | Admit: 2014-11-01 | Payer: Self-pay | Admitting: Cardiovascular Disease

## 2014-11-01 ENCOUNTER — Encounter (HOSPITAL_COMMUNITY): Admission: AD | Payer: Self-pay

## 2014-11-01 ENCOUNTER — Observation Stay (HOSPITAL_COMMUNITY)
Admission: EM | Admit: 2014-11-01 | Discharge: 2014-11-02 | DRG: 287 | Disposition: A | Discharge status: Home / Self Care | Payer: BLUE CROSS/BLUE SHIELD | Attending: Cardiology | Admitting: Cardiology

## 2014-11-01 ENCOUNTER — Encounter (HOSPITAL_COMMUNITY): Payer: Self-pay | Admitting: Family Medicine

## 2014-11-01 ENCOUNTER — Encounter (HOSPITAL_COMMUNITY)
Admission: EM | Disposition: A | Discharge status: Home / Self Care | Payer: Self-pay | Source: Home / Self Care | Attending: Emergency Medicine

## 2014-11-01 ENCOUNTER — Emergency Department (HOSPITAL_COMMUNITY): Payer: BLUE CROSS/BLUE SHIELD

## 2014-11-01 VITALS — BP 112/80 | HR 62 | Ht 69.0 in | Wt 183.0 lb

## 2014-11-01 DIAGNOSIS — I420 Dilated cardiomyopathy: Secondary | ICD-10-CM | POA: Diagnosis present

## 2014-11-01 DIAGNOSIS — Z79899 Other long term (current) drug therapy: Secondary | ICD-10-CM | POA: Diagnosis not present

## 2014-11-01 DIAGNOSIS — I249 Acute ischemic heart disease, unspecified: Secondary | ICD-10-CM

## 2014-11-01 DIAGNOSIS — Z955 Presence of coronary angioplasty implant and graft: Secondary | ICD-10-CM | POA: Diagnosis not present

## 2014-11-01 DIAGNOSIS — I2510 Atherosclerotic heart disease of native coronary artery without angina pectoris: Secondary | ICD-10-CM | POA: Diagnosis present

## 2014-11-01 DIAGNOSIS — Z8249 Family history of ischemic heart disease and other diseases of the circulatory system: Secondary | ICD-10-CM | POA: Diagnosis not present

## 2014-11-01 DIAGNOSIS — I252 Old myocardial infarction: Secondary | ICD-10-CM

## 2014-11-01 DIAGNOSIS — E785 Hyperlipidemia, unspecified: Secondary | ICD-10-CM | POA: Diagnosis not present

## 2014-11-01 DIAGNOSIS — Z881 Allergy status to other antibiotic agents status: Secondary | ICD-10-CM | POA: Diagnosis not present

## 2014-11-01 DIAGNOSIS — I10 Essential (primary) hypertension: Secondary | ICD-10-CM | POA: Diagnosis present

## 2014-11-01 DIAGNOSIS — N179 Acute kidney failure, unspecified: Secondary | ICD-10-CM | POA: Diagnosis not present

## 2014-11-01 DIAGNOSIS — K219 Gastro-esophageal reflux disease without esophagitis: Secondary | ICD-10-CM | POA: Diagnosis present

## 2014-11-01 DIAGNOSIS — R079 Chest pain, unspecified: Secondary | ICD-10-CM

## 2014-11-01 DIAGNOSIS — R0789 Other chest pain: Secondary | ICD-10-CM

## 2014-11-01 DIAGNOSIS — R9431 Abnormal electrocardiogram [ECG] [EKG]: Secondary | ICD-10-CM

## 2014-11-01 DIAGNOSIS — I214 Non-ST elevation (NSTEMI) myocardial infarction: Secondary | ICD-10-CM

## 2014-11-01 DIAGNOSIS — Z7982 Long term (current) use of aspirin: Secondary | ICD-10-CM | POA: Diagnosis not present

## 2014-11-01 DIAGNOSIS — I200 Unstable angina: Secondary | ICD-10-CM | POA: Diagnosis present

## 2014-11-01 DIAGNOSIS — I25110 Atherosclerotic heart disease of native coronary artery with unstable angina pectoris: Secondary | ICD-10-CM | POA: Diagnosis not present

## 2014-11-01 HISTORY — DX: Acute kidney failure, unspecified: N17.9

## 2014-11-01 HISTORY — PX: CARDIAC CATHETERIZATION: SHX172

## 2014-11-01 HISTORY — DX: Calculus of kidney: N20.0

## 2014-11-01 HISTORY — DX: Pure hypercholesterolemia, unspecified: E78.00

## 2014-11-01 LAB — BASIC METABOLIC PANEL
ANION GAP: 6 (ref 5–15)
BUN: 21 mg/dL — ABNORMAL HIGH (ref 6–20)
CALCIUM: 9.3 mg/dL (ref 8.9–10.3)
CHLORIDE: 103 mmol/L (ref 101–111)
CO2: 28 mmol/L (ref 22–32)
CREATININE: 1.39 mg/dL — AB (ref 0.61–1.24)
GFR calc Af Amer: >60 mL/min (ref >60)
GFR calc non Af Amer: 54 mL/min — ABNORMAL LOW (ref >60)
GLUCOSE: 105 mg/dL — AB (ref 65–99)
Potassium: 5.2 mmol/L — ABNORMAL HIGH (ref 3.5–5.1)
Sodium: 137 mmol/L (ref 135–145)

## 2014-11-01 LAB — PROTIME-INR
INR: 1.07 (ref 0.00–1.49)
Prothrombin Time: 14.1 seconds (ref 11.6–15.2)

## 2014-11-01 LAB — CBC
HCT: 46.5 % (ref 39.0–52.0)
Hemoglobin: 16 g/dL (ref 13.0–17.0)
MCH: 31.6 pg (ref 26.0–34.0)
MCHC: 34.4 g/dL (ref 30.0–36.0)
MCV: 91.9 fL (ref 78.0–100.0)
Platelets: 295 10*3/uL (ref 150–400)
RBC: 5.06 MIL/uL (ref 4.22–5.81)
RDW: 12.9 % (ref 11.5–15.5)
WBC: 9.6 10*3/uL (ref 4.0–10.5)

## 2014-11-01 LAB — I-STAT TROPONIN, ED: Troponin i, poc: 0.02 ng/mL (ref 0.00–0.08)

## 2014-11-01 SURGERY — LEFT HEART CATH AND CORONARY ANGIOGRAPHY

## 2014-11-01 SURGERY — LEFT HEART CATH AND CORONARY ANGIOGRAPHY
Anesthesia: LOCAL

## 2014-11-01 MED ORDER — ACETAMINOPHEN 325 MG PO TABS
650.0000 mg | ORAL_TABLET | ORAL | Status: DC | PRN
  Administered 2014-11-01 – 2014-11-02 (×2): 650 mg via ORAL
  Filled 2014-11-01 (×2): qty 2

## 2014-11-01 MED ORDER — FENTANYL CITRATE (PF) 100 MCG/2ML IJ SOLN
INTRAMUSCULAR | Status: AC
  Filled 2014-11-01: qty 2

## 2014-11-01 MED ORDER — LIDOCAINE HCL (PF) 1 % IJ SOLN
INTRAMUSCULAR | Status: AC
  Filled 2014-11-01: qty 30

## 2014-11-01 MED ORDER — ATORVASTATIN CALCIUM 80 MG PO TABS
80.0000 mg | ORAL_TABLET | Freq: Every day | ORAL | Status: DC
  Administered 2014-11-01: 80 mg via ORAL
  Filled 2014-11-01: qty 1

## 2014-11-01 MED ORDER — LIDOCAINE HCL (PF) 1 % IJ SOLN
INTRAMUSCULAR | Status: DC | PRN
  Administered 2014-11-01: 5 mL

## 2014-11-01 MED ORDER — SODIUM CHLORIDE 0.9 % IV SOLN: 250.0000 mL | INTRAVENOUS | Status: DC | PRN

## 2014-11-01 MED ORDER — NITROGLYCERIN 1 MG/10 ML FOR IR/CATH LAB
INTRA_ARTERIAL | Status: DC | PRN
  Administered 2014-11-01: 200 ug via INTRA_ARTERIAL

## 2014-11-01 MED ORDER — TICAGRELOR 90 MG PO TABS
90.0000 mg | ORAL_TABLET | Freq: Two times a day (BID) | ORAL | Status: DC
  Administered 2014-11-01 – 2014-11-02 (×2): 90 mg via ORAL
  Filled 2014-11-01 (×2): qty 1

## 2014-11-01 MED ORDER — MIDAZOLAM HCL 2 MG/2ML IJ SOLN
INTRAMUSCULAR | Status: AC
  Filled 2014-11-01: qty 2

## 2014-11-01 MED ORDER — VERAPAMIL HCL 2.5 MG/ML IV SOLN
INTRAVENOUS | Status: DC | PRN
  Administered 2014-11-01: 12:00:00 via INTRA_ARTERIAL

## 2014-11-01 MED ORDER — METOPROLOL TARTRATE 12.5 MG HALF TABLET
12.5000 mg | ORAL_TABLET | Freq: Two times a day (BID) | ORAL | Status: DC
  Administered 2014-11-01 – 2014-11-02 (×2): 12.5 mg via ORAL
  Filled 2014-11-01 (×2): qty 1

## 2014-11-01 MED ORDER — MIDAZOLAM HCL 2 MG/2ML IJ SOLN
INTRAMUSCULAR | Status: DC | PRN
  Administered 2014-11-01: 2 mg via INTRAVENOUS

## 2014-11-01 MED ORDER — SODIUM CHLORIDE 0.9 % IJ SOLN: 3.0000 mL | Freq: Two times a day (BID) | INTRAMUSCULAR | Status: DC

## 2014-11-01 MED ORDER — ASPIRIN 81 MG PO CHEW
324.0000 mg | CHEWABLE_TABLET | Freq: Once | ORAL | Status: AC
Start: 2014-11-01 — End: 2014-11-01
  Administered 2014-11-01: 324 mg via ORAL
  Filled 2014-11-01: qty 4

## 2014-11-01 MED ORDER — HEPARIN (PORCINE) IN NACL 2-0.9 UNIT/ML-% IJ SOLN
INTRAMUSCULAR | Status: AC
  Filled 2014-11-01: qty 1000

## 2014-11-01 MED ORDER — ASPIRIN 81 MG PO CHEW: 81.0000 mg | CHEWABLE_TABLET | Freq: Every day | ORAL | Status: DC

## 2014-11-01 MED ORDER — VERAPAMIL HCL 2.5 MG/ML IV SOLN
INTRAVENOUS | Status: AC
  Filled 2014-11-01: qty 2

## 2014-11-01 MED ORDER — ONDANSETRON HCL 4 MG/2ML IJ SOLN: 4.0000 mg | Freq: Four times a day (QID) | INTRAMUSCULAR | Status: DC | PRN

## 2014-11-01 MED ORDER — FENTANYL CITRATE (PF) 100 MCG/2ML IJ SOLN
INTRAMUSCULAR | Status: DC | PRN
  Administered 2014-11-01: 50 ug via INTRAVENOUS

## 2014-11-01 MED ORDER — HEPARIN SODIUM (PORCINE) 1000 UNIT/ML IJ SOLN
INTRAMUSCULAR | Status: AC
  Filled 2014-11-01: qty 1

## 2014-11-01 MED ORDER — NITROGLYCERIN 1 MG/10 ML FOR IR/CATH LAB
INTRA_ARTERIAL | Status: AC
  Filled 2014-11-01: qty 10

## 2014-11-01 MED ORDER — SODIUM CHLORIDE 0.9 % WEIGHT BASED INFUSION: 3.0000 mL/kg/h | INTRAVENOUS | Status: AC

## 2014-11-01 MED ORDER — SODIUM CHLORIDE 0.9 % IJ SOLN: 3.0000 mL | INTRAMUSCULAR | Status: DC | PRN

## 2014-11-01 MED ORDER — HEPARIN SODIUM (PORCINE) 1000 UNIT/ML IJ SOLN
INTRAMUSCULAR | Status: DC | PRN
  Administered 2014-11-01: 4200 [IU] via INTRAVENOUS

## 2014-11-01 SURGICAL SUPPLY — 13 items
CATH INFINITI 5FR ANG PIGTAIL (CATHETERS) ×3
CATH INFINITI 5FR MULTPACK ANG (CATHETERS)
CATH OPTITORQUE TIG 4.0 5F (CATHETERS) ×3
DEVICE RAD COMP TR BAND LRG (VASCULAR PRODUCTS) ×3
GLIDESHEATH SLEND A-KIT 6F 22G (SHEATH) ×3
KIT HEART LEFT (KITS) ×3
PACK CARDIAC CATHETERIZATION (CUSTOM PROCEDURE TRAY) ×3
SHEATH PINNACLE 5F 10CM (SHEATH)
SYR MEDRAD MARK V 150ML (SYRINGE) ×3
TRANSDUCER W/STOPCOCK (MISCELLANEOUS) ×3
TUBING CIL FLEX 10 FLL-RA (TUBING) ×3
WIRE EMERALD 3MM-J .035X150CM (WIRE)
WIRE SAFE-T 1.5MM-J .035X260CM (WIRE) ×3

## 2014-11-01 NOTE — ED Notes (Signed)
Pt sts he went to follow up appt this am post stent placement and was told to come here with worsening EKG. sts told to page Trish. sts some chest tightness denies SOB.

## 2014-11-01 NOTE — H&P (View-Only) (Signed)
Patient ID: Lee Taylor MRN: [id number], DOB/AGE: 11-24-1955   Admit date: 11/01/14   Primary Physician: Nani Gasser, MD Primary Cardiologist: Dr Jens Som  HPI: The pt is a 59 y.o. male with a history of HTN and GERD. He had chest pressure while doing yard work the day of admission. He had gone to an Urgent Care and had lab drawn. While he was watching Game 7 of the [employer] finals the clinic called and said his Troponin was elevated and to go to the ED. Cath revealed a 95% LAD that was treated with a DES. EF was 40-45%.   He is in the office today and complains of continued chest pain, similar to his pre PCI symptoms. His EKG was reviewed with Dr Patty Sermons and he has inferior changes. He is to be admitted to [facility] for re look cath.    Problem List: Past Medical History  Diagnosis Date  . Hypertension   . NSTEMI (non-ST elevated myocardial infarction) 10/20/2014    2.5 x 20 Synergy DES to the LAD, EF 40-45 percent  . Coronary artery disease 10/2014  . GERD (gastroesophageal reflux disease)     Past Surgical History  Procedure Laterality Date  . Tonsillectomy    . Inner ear surgery    . Coronary stent placement  10/23/2014    2.5 x 20 Synergy DES to the LAD  . Cardiac catheterization N/A 10/23/2014    Procedure: Left Heart Cath and Coronary Angiography;  Surgeon: Corky Crafts, MD; LAD 95%, thrombotic, small ramus intermedius, CFX and OM branch is patent, RCA 20%     . Cardiac catheterization N/A 10/23/2014    Procedure: Coronary Stent Intervention;  Surgeon: Corky Crafts, MD; 2.5 x 20 Synergy DES to the LAD       Allergies:  Allergies  Allergen Reactions  . Amoxicillin      Home Medications Current Outpatient Prescriptions  Medication Sig Dispense Refill  . aspirin 81 MG tablet Take 81 mg by mouth daily.      [name] atorvastatin (LIPITOR) 80 MG tablet Take 1 tablet (80 mg total) by mouth daily at 6 PM. 30 tablet 11  . Calcium Carb-Cholecalciferol 600-800  MG-UNIT TABS Take 1 tablet by mouth daily.     [name] CINNAMON PO Take 1 tablet by mouth daily.     . Ginkgo Biloba 40 MG TABS Take 60 mg by mouth daily.     [name] lisinopril (PRINIVIL,ZESTRIL) 20 MG tablet TAKE ONE TABLET BY MOUTH ONCE DAILY. 90 tablet 1  . metoprolol tartrate (LOPRESSOR) 25 MG tablet Take 0.5 tablets (12.5 mg total) by mouth 2 (two) times daily. 35 tablet 11  . Multiple Vitamin (MULTIVITAMIN) capsule Take 1 capsule by mouth daily.      . Multiple Vitamins-Minerals (OCUVITE PO) Take 1 tablet by mouth daily.     . nitroGLYCERIN (NITROSTAT) 0.4 MG SL tablet Place 1 tablet (0.4 mg total) under the tongue every 5 (five) minutes as needed for chest pain. 25 tablet 3  . polycarbophil (FIBERCON) 625 MG tablet Take 625 mg by mouth daily.    . ranitidine (ZANTAC) 150 MG tablet Take 150 mg by mouth 2 (two) times daily.      . ticagrelor (BRILINTA) 90 MG TABS tablet Take 1 tablet (90 mg total) by mouth 2 (two) times daily. 60 tablet 11  . vitamin C (ASCORBIC ACID) 500 MG tablet Take 500 mg by mouth daily.     No current facility-administered medications for this  visit.     Family History  Problem Relation Age of Onset  . Heart failure Mother   . Heart attack Sister 548    smoker    . Heart disease Mother     triple bypass   . Stroke Father      History   Social History  . Marital Status: Single    Spouse Name: Gershon Cullriscilla   . Number of Children: 2  . Years of Education: N/A   Occupational History  . Museum/gallery exhibitions officerManufacturing Assembler    Social History Main Topics  . Smoking status: Never Smoker   . Smokeless tobacco: Never Used  . Alcohol Use: No  . Drug Use: No  . Sexual Activity:    Partners: Female   Other Topics Concern  . Not on file   Social History Narrative     Review of Systems: General: negative for chills, fever, night sweats or weight changes.  Cardiovascular: negative for chest pain, dyspnea on exertion, edema, orthopnea, palpitations, paroxysmal nocturnal dyspnea  or shortness of breath Dermatological: negative for rash Respiratory: negative for cough or wheezing Urologic: negative for hematuria Abdominal: negative for nausea, vomiting, diarrhea, bright red blood per rectum, melena, or hematemesis Neurologic: negative for visual changes, syncope, or dizziness All other systems reviewed and are otherwise negative except as noted above.  Physical Exam: Blood pressure 112/80, pulse 62, height 5\' 9"  (1.753 m), weight 183 lb (83.008 kg).  General appearance: alert, cooperative and no distress Neck: no carotid bruit and no JVD Lungs: clear to auscultation bilaterally Heart: regular rate and rhythm, S1, S2 normal, no murmur, click, rub or gallop Abdomen: soft, non-tender; bowel sounds normal; no masses,  no organomegaly Extremities: extremities normal, atraumatic, no cyanosis or edema Pulses: 2+ and symmetric Skin: Skin color, texture, turgor normal. No rashes or lesions Neurologic: Grossly normal    Labs:  No results found for this or any previous visit (from the past 24 hour(s)).   Radiology/Studies: Dg Chest 2 View  10/20/2014   CLINICAL DATA:  Cough. Congestion. Upper respiratory infection for 2 weeks. Chest tightness. Shortness of breath.  EXAM: CHEST  2 VIEW  COMPARISON:  None.  FINDINGS: Thoracic spondylosis.  The lungs appear clear. Cardiac and mediastinal contours normal. No pleural effusion identified.  IMPRESSION: Thoracic spondylosis.  Otherwise negative.   Electronically Signed   By: Gaylyn RongWalter  Liebkemann M.D.   On: 10/20/2014 15:49    EKG:NSR inferior ST elevation, anterior septal TWI  ASSESSMENT AND PLAN:  ACS NSTEMI 10/23/14- LAD DES ICM-EF 40-45%   PLAN:  Admit, cath today.    Deland PrettySigned, Severus Brodzinski K, PA-C 11/01/2014, 9:49 AM [phone]

## 2014-11-01 NOTE — Progress Notes (Signed)
   Patient ID: Lee Taylor MRN: [id number], DOB/AGE: 05/17/1955   Admit date: 11/01/14   Primary Physician: METHENEY,CATHERINE, MD Primary Cardiologist: Dr Crenshaw  HPI: The pt is a 59 y.o. male with a history of HTN and GERD. He had chest pressure while doing yard work the day of admission. He had gone to an Urgent Care and had lab drawn. While he was watching Game 7 of the [employer] finals the clinic called and said his Troponin was elevated and to go to the ED. Cath revealed a 95% LAD that was treated with a DES. EF was 40-45%.   He is in the office today and complains of continued chest pain, similar to his pre PCI symptoms. His EKG was reviewed with Dr Brackbill and he has inferior changes. He is to be admitted to [facility] for re look cath.    Problem List: Past Medical History  Diagnosis Date  . Hypertension   . NSTEMI (non-ST elevated myocardial infarction) 10/20/2014    2.5 x 20 Synergy DES to the LAD, EF 40-45 percent  . Coronary artery disease 10/2014  . GERD (gastroesophageal reflux disease)     Past Surgical History  Procedure Laterality Date  . Tonsillectomy    . Inner ear surgery    . Coronary stent placement  10/23/2014    2.5 x 20 Synergy DES to the LAD  . Cardiac catheterization N/A 10/23/2014    Procedure: Left Heart Cath and Coronary Angiography;  Surgeon: Jayadeep S Varanasi, MD; LAD 95%, thrombotic, small ramus intermedius, CFX and OM branch is patent, RCA 20%     . Cardiac catheterization N/A 10/23/2014    Procedure: Coronary Stent Intervention;  Surgeon: Jayadeep S Varanasi, MD; 2.5 x 20 Synergy DES to the LAD       Allergies:  Allergies  Allergen Reactions  . Amoxicillin      Home Medications Current Outpatient Prescriptions  Medication Sig Dispense Refill  . aspirin 81 MG tablet Take 81 mg by mouth daily.      . atorvastatin (LIPITOR) 80 MG tablet Take 1 tablet (80 mg total) by mouth daily at 6 PM. 30 tablet 11  . Calcium Carb-Cholecalciferol 600-800  MG-UNIT TABS Take 1 tablet by mouth daily.     . CINNAMON PO Take 1 tablet by mouth daily.     . Ginkgo Biloba 40 MG TABS Take 60 mg by mouth daily.     . lisinopril (PRINIVIL,ZESTRIL) 20 MG tablet TAKE ONE TABLET BY MOUTH ONCE DAILY. 90 tablet 1  . metoprolol tartrate (LOPRESSOR) 25 MG tablet Take 0.5 tablets (12.5 mg total) by mouth 2 (two) times daily. 35 tablet 11  . Multiple Vitamin (MULTIVITAMIN) capsule Take 1 capsule by mouth daily.      . Multiple Vitamins-Minerals (OCUVITE PO) Take 1 tablet by mouth daily.     . nitroGLYCERIN (NITROSTAT) 0.4 MG SL tablet Place 1 tablet (0.4 mg total) under the tongue every 5 (five) minutes as needed for chest pain. 25 tablet 3  . polycarbophil (FIBERCON) 625 MG tablet Take 625 mg by mouth daily.    . ranitidine (ZANTAC) 150 MG tablet Take 150 mg by mouth 2 (two) times daily.      . ticagrelor (BRILINTA) 90 MG TABS tablet Take 1 tablet (90 mg total) by mouth 2 (two) times daily. 60 tablet 11  . vitamin C (ASCORBIC ACID) 500 MG tablet Take 500 mg by mouth daily.     No current facility-administered medications for this   visit.     Family History  Problem Relation Age of Onset  . Heart failure Mother   . Heart attack Sister 548    smoker    . Heart disease Mother     triple bypass   . Stroke Father      History   Social History  . Marital Status: Single    Spouse Name: Gershon Cullriscilla   . Number of Children: 2  . Years of Education: N/A   Occupational History  . Museum/gallery exhibitions officerManufacturing Assembler    Social History Main Topics  . Smoking status: Never Smoker   . Smokeless tobacco: Never Used  . Alcohol Use: No  . Drug Use: No  . Sexual Activity:    Partners: Female   Other Topics Concern  . Not on file   Social History Narrative     Review of Systems: General: negative for chills, fever, night sweats or weight changes.  Cardiovascular: negative for chest pain, dyspnea on exertion, edema, orthopnea, palpitations, paroxysmal nocturnal dyspnea  or shortness of breath Dermatological: negative for rash Respiratory: negative for cough or wheezing Urologic: negative for hematuria Abdominal: negative for nausea, vomiting, diarrhea, bright red blood per rectum, melena, or hematemesis Neurologic: negative for visual changes, syncope, or dizziness All other systems reviewed and are otherwise negative except as noted above.  Physical Exam: Blood pressure 112/80, pulse 62, height 5\' 9"  (1.753 m), weight 183 lb (83.008 kg).  General appearance: alert, cooperative and no distress Neck: no carotid bruit and no JVD Lungs: clear to auscultation bilaterally Heart: regular rate and rhythm, S1, S2 normal, no murmur, click, rub or gallop Abdomen: soft, non-tender; bowel sounds normal; no masses,  no organomegaly Extremities: extremities normal, atraumatic, no cyanosis or edema Pulses: 2+ and symmetric Skin: Skin color, texture, turgor normal. No rashes or lesions Neurologic: Grossly normal    Labs:  No results found for this or any previous visit (from the past 24 hour(s)).   Radiology/Studies: Dg Chest 2 View  10/20/2014   CLINICAL DATA:  Cough. Congestion. Upper respiratory infection for 2 weeks. Chest tightness. Shortness of breath.  EXAM: CHEST  2 VIEW  COMPARISON:  None.  FINDINGS: Thoracic spondylosis.  The lungs appear clear. Cardiac and mediastinal contours normal. No pleural effusion identified.  IMPRESSION: Thoracic spondylosis.  Otherwise negative.   Electronically Signed   By: Gaylyn RongWalter  Liebkemann M.D.   On: 10/20/2014 15:49    EKG:NSR inferior ST elevation, anterior septal TWI  ASSESSMENT AND PLAN:  ACS NSTEMI 10/23/14- LAD DES ICM-EF 40-45%   PLAN:  Admit, cath today.    Deland PrettySigned, Leyton Brownlee K, PA-C 11/01/2014, 9:49 AM [phone]

## 2014-11-01 NOTE — Interval H&P Note (Signed)
Cath Lab Visit (complete for each Cath Lab visit)  Clinical Evaluation Leading to the Procedure:   ACS: No.  Non-ACS:    Anginal Classification: CCS IV  Anti-ischemic medical therapy: Maximal Therapy (2 or more classes of medications)  Non-Invasive Test Results: No non-invasive testing performed  Prior CABG: No previous CABG      History and Physical Interval Note:  11/01/2014 12:15 PM  Lee Taylor  has presented today for surgery, with the diagnosis of chest pain  The various methods of treatment have been discussed with the patient and family. After consideration of risks, benefits and other options for treatment, the patient has consented to  Procedure(s): Left Heart Cath and Coronary Angiography (N/A) as a surgical intervention .  The patient's history has been reviewed, patient examined, no change in status, stable for surgery.  I have reviewed the patient's chart and labs.  Questions were answered to the patient's satisfaction.     Lee Taylor,Ger A

## 2014-11-01 NOTE — ED Notes (Signed)
Pt placed in gown. Monitored by pulse ox, bp cuff, and 12-lead.

## 2014-11-01 NOTE — Assessment & Plan Note (Signed)
LAD DES 10/23/14

## 2014-11-01 NOTE — H&P (Signed)
Patient ID: Lee Taylor MRN: [id number], DOB/AGE: 59/08/1955   Admit date: 11/01/14   Primary Physician: Nani GasserMETHENEY,CATHERINE, MD Primary Cardiologist: Dr Jens Somrenshaw  HPI: The pt is a 59 y.o. male with a history of HTN and GERD. He had chest pressure while doing yard work the day of admission. He had gone to an Urgent Care and had lab drawn. While he was watching Game 7 of the NBA finals the clinic called and said his Troponin was elevated and to go to the ED. Cath revealed a 95% LAD that was treated with a DES. EF was 40-45%.   He is in the office today and complains of continued chest pain, similar to his pre PCI symptoms. His EKG was reviewed with Dr Patty SermonsBrackbill and he has inferior changes. [facility] look cath.    Problem List: Past Medical History  Diagnosis Date  . Hypertension   . NSTEMI (non-ST elevated myocardial infarction) 10/20/2014    2.5 x 20 Synergy DES to the LAD, EF 40-45 percent  . Coronary artery disease 10/2014  . GERD (gastroesophageal reflux disease)     Past Surgical History  Procedure Laterality Date  . Tonsillectomy    . Inner ear surgery    . Coronary stent placement  10/23/2014    2.5 x 20 Synergy DES to the LAD  . Cardiac catheterization N/A 10/23/2014    Procedure: Left Heart Cath and Coronary Angiography; Surgeon: Corky CraftsJayadeep S Varanasi, MD; LAD 95%, thrombotic, small ramus intermedius, CFX and OM branch is patent, RCA 20%   . Cardiac catheterization N/A 10/23/2014    Procedure: Coronary Stent Intervention; Surgeon: Corky CraftsJayadeep S Varanasi, MD; 2.5 x 20 Synergy DES to the LAD      Allergies:  Allergies  Allergen Reactions  . Amoxicillin      Home Medications Current Outpatient Prescriptions  Medication Sig Dispense Refill  . aspirin 81 MG tablet Take 81 mg by mouth daily.     [name]. atorvastatin (LIPITOR) 80 MG tablet Take 1 tablet (80 mg total) by mouth  daily at 6 PM. 30 tablet 11  . Calcium Carb-Cholecalciferol 600-800 MG-UNIT TABS Take 1 tablet by mouth daily.     [name]. CINNAMON PO Take 1 tablet by mouth daily.     . Ginkgo Biloba 40 MG TABS Take 60 mg by mouth daily.     [name]. lisinopril (PRINIVIL,ZESTRIL) 20 MG tablet TAKE ONE TABLET BY MOUTH ONCE DAILY. 90 tablet 1  . metoprolol tartrate (LOPRESSOR) 25 MG tablet Take 0.5 tablets (12.5 mg total) by mouth 2 (two) times daily. 35 tablet 11  . Multiple Vitamin (MULTIVITAMIN) capsule Take 1 capsule by mouth daily.     . Multiple Vitamins-Minerals (OCUVITE PO) Take 1 tablet by mouth daily.     . nitroGLYCERIN (NITROSTAT) 0.4 MG SL tablet Place 1 tablet (0.4 mg total) under the tongue every 5 (five) minutes as needed for chest pain. 25 tablet 3  . polycarbophil (FIBERCON) 625 MG tablet Take 625 mg by mouth daily.    . ranitidine (ZANTAC) 150 MG tablet Take 150 mg by mouth 2 (two) times daily.     . ticagrelor (BRILINTA) 90 MG TABS tablet Take 1 tablet (90 mg total) by mouth 2 (two) times daily. 60 tablet 11  . vitamin C (ASCORBIC ACID) 500 MG tablet Take 500 mg by mouth daily.     No current facility-administered medications for this visit.     Family History  Problem Relation Age of Onset  . Heart failure Mother   . Heart attack Sister 81    smoker   . Heart disease Mother     triple bypass   . Stroke Father      History   Social History  . Marital Status: Single    Spouse Name: [name]   . Number of Children: 2  . Years of Education: N/A   Occupational History  . Museum/gallery exhibitions officer    Social History Main Topics  . Smoking status: Never Smoker   . Smokeless tobacco: Never Used  . Alcohol Use: No  . Drug Use: No  . Sexual Activity:    Partners: Female   Other Topics Concern  . Not on file   Social History Narrative      Review of Systems: General: negative for chills, fever, night sweats or weight changes.  Cardiovascular: negative for chest pain, dyspnea on exertion, edema, orthopnea, palpitations, paroxysmal nocturnal dyspnea or shortness of breath Dermatological: negative for rash Respiratory: negative for cough or wheezing Urologic: negative for hematuria Abdominal: negative for nausea, vomiting, diarrhea, bright red blood per rectum, melena, or hematemesis Neurologic: negative for visual changes, syncope, or dizziness All other systems reviewed and are otherwise negative except as noted above.  Physical Exam: Blood pressure 112/80, pulse 62, height  (1.753 m), weight 183 lb (83.008 kg).  General appearance: alert, cooperative and no distress Neck: no carotid bruit and no JVD Lungs: clear to auscultation bilaterally Heart: regular rate and rhythm, S1, S2 normal, no murmur, click, rub or gallop Abdomen: soft, non-tender; bowel sounds normal; no masses, no organomegaly Extremities: extremities normal, atraumatic, no cyanosis or edema Pulses: 2+ and symmetric Skin: Skin color, texture, turgor normal. No rashes or lesions Neurologic: Grossly normal    Labs:    Lab Results Last 24 Hours    No results found for this or any previous visit (from the past 24 hour(s)).     Imaging Results    Radiology/Studies: Dg Chest 2 View  10/20/2014 CLINICAL DATA: Cough. Congestion. Upper respiratory infection for 2 weeks. Chest tightness. Shortness of breath. EXAM: CHEST 2 VIEW COMPARISON: None. FINDINGS: Thoracic spondylosis. The lungs appear clear. Cardiac and mediastinal contours normal. No pleural effusion identified. IMPRESSION: Thoracic spondylosis. Otherwise negative. Electronically Signed By: Gaylyn Rong M.D. On: 10/20/2014 15:49     EKG:NSR inferior ST elevation, anterior septal TWI  ASSESSMENT AND PLAN:  ACS NSTEMI 10/23/14- LAD DES ICM-EF 40-45%   PLAN:  Admit, cath today.    Deland Pretty, PA-C 11/01/2014, 9:49 AM [phone]

## 2014-11-01 NOTE — Assessment & Plan Note (Signed)
Pt with recurrent chest pain with abnormal EKG

## 2014-11-01 NOTE — Assessment & Plan Note (Signed)
controlled 

## 2014-11-01 NOTE — Patient Instructions (Addendum)
Medication Instructions:  Your physician recommends that you continue on your current medications as directed. Please refer to the Current Medication list given to you today.  Testing/Procedures: Your physician has requested that you have a cardiac catheterization. Cardiac catheterization is used to diagnose and/or treat various heart conditions. Doctors may recommend this procedure for a number of different reasons. The most common reason is to evaluate chest pain. Chest pain can be a symptom of coronary artery disease (CAD), and cardiac catheterization can show whether plaque is narrowing or blocking your heart's arteries. This procedure is also used to evaluate the valves, as well as measure the blood flow and oxygen levels in different parts of your heart. For further information please visit [email or web address]. Please follow instruction sheet, as given.  Follow-Up: Will be scheduled after your cardiac cath.  Thank you for choosing [facility]!!

## 2014-11-01 NOTE — ED Provider Notes (Addendum)
CSN: [id number]     Arrival date & time 11/01/14  1024 History   First MD Initiated Contact with Patient 11/01/14 1025     Chief Complaint  Patient presents with  . Chest Pain     (Consider location/radiation/quality/duration/timing/severity/associated sxs/prior Treatment) Patient is a 59 y.o. male presenting with chest pain. The history is provided by the patient.  Chest Pain Associated symptoms: no back pain, no fever, no headache, no nausea, no shortness of breath and not vomiting   Patient w with hx cad, s/p recent cath/stent 1-2 weeks ago. Since then, pt with recurrent episodes cp/tightness w sob, associated w mild exertion. Episodes generally brief, relieved w rest. No associated nv or diaphoresis. Went to see cardiologist today, was told ecg abn and need to repeat cath.  Pt denies current chest pain or discomfort. No current sob. Pain when present, was not pleuritic. No cough or uri c/o. No fever or chills.       Past Medical History  Diagnosis Date  . Hypertension   . NSTEMI (non-ST elevated myocardial infarction) 10/20/2014    2.5 x 20 Synergy DES to the LAD, EF 40-45 percent  . Coronary artery disease 10/2014  . GERD (gastroesophageal reflux disease)    Past Surgical History  Procedure Laterality Date  . Tonsillectomy    . Inner ear surgery    . Coronary stent placement  10/23/2014    2.5 x 20 Synergy DES to the LAD  . Cardiac catheterization N/A 10/23/2014    Procedure: Left Heart Cath and Coronary Angiography;  Surgeon: Corky Crafts, MD; LAD 95%, thrombotic, small ramus intermedius, CFX and OM branch is patent, RCA 20%     . Cardiac catheterization N/A 10/23/2014    Procedure: Coronary Stent Intervention;  Surgeon: Corky Crafts, MD; 2.5 x 20 Synergy DES to the LAD     Family History  Problem Relation Age of Onset  . Heart failure Mother   . Heart attack Sister 72    smoker    . Heart disease Mother     triple bypass   . Stroke Father    History   Substance Use Topics  . Smoking status: Never Smoker   . Smokeless tobacco: Never Used  . Alcohol Use: No    Review of Systems  Constitutional: Negative for fever and chills.  HENT: Negative for sore throat.   Eyes: Negative for redness.  Respiratory: Negative for shortness of breath.   Cardiovascular: Positive for chest pain.  Gastrointestinal: Negative for nausea and vomiting.  Genitourinary: Negative for flank pain.  Musculoskeletal: Negative for back pain and neck pain.  Skin: Negative for rash.  Neurological: Negative for headaches.  Hematological: Does not bruise/bleed easily.  Psychiatric/Behavioral: Negative for confusion.      Allergies  Amoxicillin  Home Medications   Prior to Admission medications   Medication Sig Start Date End Date Taking? Authorizing Provider  aspirin 81 MG tablet Take 81 mg by mouth daily.      Historical Provider, MD  atorvastatin (LIPITOR) 80 MG tablet Take 1 tablet (80 mg total) by mouth daily at 6 PM. 10/24/14   Rhonda G Barrett, PA-C  Calcium Carb-Cholecalciferol 600-800 MG-UNIT TABS Take 1 tablet by mouth daily.     Historical Provider, MD  CINNAMON PO Take 1 tablet by mouth daily.     Historical Provider, MD  Ginkgo Biloba 40 MG TABS Take 60 mg by mouth daily.     Historical Provider, MD  lisinopril (  PRINIVIL,ZESTRIL) 20 MG tablet TAKE ONE TABLET BY MOUTH ONCE DAILY. 05/31/14   Laren Boom, DO  metoprolol tartrate (LOPRESSOR) 25 MG tablet Take 0.5 tablets (12.5 mg total) by mouth 2 (two) times daily. 10/24/14   Joline Salt Barrett, PA-C  Multiple Vitamin (MULTIVITAMIN) capsule Take 1 capsule by mouth daily.      Historical Provider, MD  Multiple Vitamins-Minerals (OCUVITE PO) Take 1 tablet by mouth daily.     Historical Provider, MD  nitroGLYCERIN (NITROSTAT) 0.4 MG SL tablet Place 1 tablet (0.4 mg total) under the tongue every 5 (five) minutes as needed for chest pain. 10/24/14   Rhonda G Barrett, PA-C  polycarbophil (FIBERCON) 625 MG tablet  Take 625 mg by mouth daily.    Historical Provider, MD  ranitidine (ZANTAC) 150 MG tablet Take 150 mg by mouth 2 (two) times daily.      Historical Provider, MD  ticagrelor (BRILINTA) 90 MG TABS tablet Take 1 tablet (90 mg total) by mouth 2 (two) times daily. 10/24/14   Rhonda G Barrett, PA-C  vitamin C (ASCORBIC ACID) 500 MG tablet Take 500 mg by mouth daily.    Historical Provider, MD   BP 133/83 mmHg  Pulse 78  Temp(Src) 97.8 F (36.6 C) (Oral)  Resp 18  SpO2 100% Physical Exam  Constitutional: He appears well-developed and well-nourished. No distress.  HENT:  Head: Atraumatic.  Eyes: Conjunctivae are normal.  Neck: Neck supple. No tracheal deviation present.  Cardiovascular: Normal rate, regular rhythm, normal heart sounds and intact distal pulses.   Pulmonary/Chest: Effort normal and breath sounds normal. No accessory muscle usage. No respiratory distress.  Abdominal: He exhibits no distension.  Musculoskeletal: Normal range of motion. He exhibits no edema.  Neurological: He is alert.  Skin: Skin is warm and dry. He is not diaphoretic.  Psychiatric: He has a normal mood and affect.  Nursing note and vitals reviewed.   ED Course  Procedures (including critical care time) Labs Review   Results for orders placed or performed during the hospital encounter of 11/01/14  CBC  Result Value Ref Range   WBC 9.6 4.0 - 10.5 K/uL   RBC 5.06 4.22 - 5.81 MIL/uL   Hemoglobin 16.0 13.0 - 17.0 g/dL   HCT 40.9 81.1 - 91.4 %   MCV 91.9 78.0 - 100.0 fL   MCH 31.6 26.0 - 34.0 pg   MCHC 34.4 30.0 - 36.0 g/dL   RDW 78.2 95.6 - 21.3 %   Platelets 295 150 - 400 K/uL  Basic metabolic panel  Result Value Ref Range   Sodium 137 135 - 145 mmol/L   Potassium 5.2 (H) 3.5 - 5.1 mmol/L   Chloride 103 101 - 111 mmol/L   CO2 28 22 - 32 mmol/L   Glucose, Bld 105 (H) 65 - 99 mg/dL   BUN 21 (H) 6 - 20 mg/dL   Creatinine, Ser 0.86 (H) 0.61 - 1.24 mg/dL   Calcium 9.3 8.9 - 57.8 mg/dL   GFR calc non  Af Amer 54 (L) >60 mL/min   GFR calc Af Amer >60 >60 mL/min   Anion gap 6 5 - 15  Protime-INR  Result Value Ref Range   Prothrombin Time 14.1 11.6 - 15.2 seconds   INR 1.07 0.00 - 1.49  I-stat troponin, ED  (not at [facility], [facility])  Result Value Ref Range   Troponin i, poc 0.02 0.00 - 0.08 ng/mL   Comment 3  Dg Chest 2 View  11/01/2014   CLINICAL DATA:  Three-day history of chest tightness  EXAM: CHEST  2 VIEW  COMPARISON:  October 20, 2014  FINDINGS: Lungs are clear. Heart size and pulmonary vascularity are normal. No adenopathy. No pneumothorax. There is slight anterior wedging of a mid thoracic vertebral body, stable.  IMPRESSION: No edema or consolidation.   Electronically Signed   By: Bretta BangWilliam  Woodruff III M.D.   On: 11/01/2014 11:31      EKG Interpretation   Date/Time:  Wednesday November 01 2014 10:30:59 EDT Ventricular Rate:  64 PR Interval:  143 QRS Duration: 99 QT Interval:  433 QTC Calculation: 447 R Axis:   133 Text Interpretation:  Sinus rhythm Nonspecific T wave abnormality `sl st  elev inf, similar t inf noted on prior ecg (when pt present w acs)  Reconfirmed by Denton LankSTEINL  MD, Caryn BeeKEVIN ([id number]) on 11/01/2014 10:46:35 AM      MDM   Iv ns. Continuous pulse ox and monitor. o2 [place].  Asa.  Reviewed nursing notes and prior charts for additional history.   Cardiology immediately paged upon my eval.  Pt has no current chest pain or discomfort of any sort.  Plan to take to cath lab for re-look.  Reviewed nursing notes and prior charts for additional history.   Discussed with cardiology, Trish, they will call for pt to go to cath lab as soon as ready.  Pt remains cp free.        Cathren LaineKevin Alton Tremblay, MD 11/01/14 1139

## 2014-11-02 ENCOUNTER — Telehealth: Payer: Self-pay | Admitting: Cardiovascular Disease

## 2014-11-02 ENCOUNTER — Encounter (HOSPITAL_COMMUNITY): Payer: Self-pay | Admitting: Cardiovascular Disease

## 2014-11-02 ENCOUNTER — Telehealth: Payer: Self-pay | Admitting: Cardiology

## 2014-11-02 DIAGNOSIS — I10 Essential (primary) hypertension: Secondary | ICD-10-CM | POA: Diagnosis not present

## 2014-11-02 DIAGNOSIS — I200 Unstable angina: Secondary | ICD-10-CM | POA: Diagnosis present

## 2014-11-02 DIAGNOSIS — R0789 Other chest pain: Secondary | ICD-10-CM | POA: Diagnosis not present

## 2014-11-02 DIAGNOSIS — I25110 Atherosclerotic heart disease of native coronary artery with unstable angina pectoris: Secondary | ICD-10-CM | POA: Diagnosis not present

## 2014-11-02 LAB — BASIC METABOLIC PANEL
Anion gap: 9 (ref 5–15)
BUN: 18 mg/dL (ref 6–20)
CHLORIDE: 102 mmol/L (ref 101–111)
CO2: 26 mmol/L (ref 22–32)
Calcium: 9 mg/dL (ref 8.9–10.3)
Creatinine, Ser: 1.09 mg/dL (ref 0.61–1.24)
GFR calc Af Amer: >60 mL/min (ref >60)
GFR calc non Af Amer: >60 mL/min (ref >60)
Glucose, Bld: 112 mg/dL — ABNORMAL HIGH (ref 65–99)
POTASSIUM: 3.6 mmol/L (ref 3.5–5.1)
Sodium: 137 mmol/L (ref 135–145)

## 2014-11-02 MED FILL — Heparin Sodium (Porcine) 2 Unit/ML in Sodium Chloride 0.9%: INTRAMUSCULAR | Qty: 500 | Status: AC

## 2014-11-02 NOTE — Telephone Encounter (Signed)
11/02/2014 Patient walk-in with FMLA and Disability form from [employer] patient fill out release form and left 50.00 check, I then made copies for our records and put originals in courier bag to go to [place] to process.  cbr

## 2014-11-02 NOTE — Discharge Instructions (Signed)
Chest Pain (Nonspecific) °It is often hard to give a diagnosis for the cause of chest pain. There is always a chance that your pain could be related to something serious, such as a heart attack or a blood clot in the lungs. You need to follow up with your doctor. °HOME CARE °· If antibiotic medicine was given, take it as directed by your doctor. Finish the medicine even if you start to feel better. °· For the next few days, avoid activities that bring on chest pain. Continue physical activities as told by your doctor. °· Do not use any tobacco products. This includes cigarettes, chewing tobacco, and e-cigarettes. °· Avoid drinking alcohol. °· Only take medicine as told by your doctor. °· Follow your doctor's suggestions for more testing if your chest pain does not go away. °· Keep all doctor visits you made. °GET HELP IF: °· Your chest pain does not go away, even after treatment. °· You have a rash with blisters on your chest. °· You have a fever. °GET HELP RIGHT AWAY IF:  °· You have more pain or pain that spreads to your arm, neck, jaw, back, or belly (abdomen). °· You have shortness of breath. °· You cough more than usual or cough up blood. °· You have very bad back or belly pain. °· You feel sick to your stomach (nauseous) or throw up (vomit). °· You have very bad weakness. °· You pass out (faint). °· You have chills. °This is an emergency. Do not wait to see if the problems will go away. Call your local emergency services (911 in [place] Do not drive yourself to the hospital. °MAKE SURE YOU:  °· Understand these instructions. °· Will watch your condition. °· Will get help right away if you are not doing well or get worse. °Document Released: 10/08/2007 Document Revised: 04/26/2013 Document Reviewed: 10/08/2007 °ExitCare® Patient Information ©2015 [employer]. This information is not intended to replace advice given to you by your health care provider. Make sure you discuss any questions you have with your  health care provider. ° °No driving for 24 hours. No lifting over 5 lbs for 1 week. No sexual activity for 1 week. Keep procedure site clean & dry. If you notice increased pain, swelling, bleeding or pus, call/return!  You may shower, but no soaking baths/hot tubs/pools for 1 week.  ° ° °

## 2014-11-02 NOTE — Progress Notes (Signed)
Patient Name: Lee Taylor Date of Encounter: 11/02/2014  Primary Cardiologist: Dr. Jens Somrenshaw   Principal Problem:   Chest tightness Active Problems:   Essential hypertension, benign   GERD (gastroesophageal reflux disease)   DCM (dilated cardiomyopathy)   CAD (coronary artery disease), native coronary artery    SUBJECTIVE  States he had intermittent chest pressure for several days, mainly noticeable during exertion, denies actual pain. No SOB.   CURRENT MEDS . aspirin  81 mg Oral Daily  . atorvastatin  80 mg Oral q1800  . metoprolol tartrate  12.5 mg Oral BID  . sodium chloride  3 mL Intravenous Q12H  . ticagrelor  90 mg Oral BID    OBJECTIVE  Filed Vitals:   11/01/14 1700 11/01/14 2010 11/02/14 0001 11/02/14 0604  BP: 104/62 109/59 102/59 106/68  Pulse:  77 59 61  Temp:  98 F (36.7 C) 98.1 F (36.7 C) 98.2 F (36.8 C)  TempSrc:  Oral Oral Oral  Resp: 14 23 18 20   Weight:   185 lb 3 oz (84 kg)   SpO2: 96% 98% 97% 98%    Intake/Output Summary (Last 24 hours) at 11/02/14 0811 Last data filed at 11/02/14 0603  Gross per 24 hour  Intake   1480 ml  Output   2250 ml  Net   -770 ml   Filed Weights   11/02/14 0001  Weight: 185 lb 3 oz (84 kg)    PHYSICAL EXAM  General: Pleasant, NAD. Neuro: Alert and oriented X 3. Moves all extremities spontaneously. Psych: Normal affect. HEENT:  Normal  Neck: Supple without bruits or JVD. Lungs:  Resp regular and unlabored, CTA.  Heart: RRR no s3, s4, or murmurs. R radial site stable with good distal pulse Abdomen: Soft, non-tender, non-distended, BS + x 4.  Extremities: No clubbing, cyanosis or edema. DP/PT/Radials 2+ and equal bilaterally.  Accessory Clinical Findings  CBC  Recent Labs  11/01/14 1050  WBC 9.6  HGB 16.0  HCT 46.5  MCV 91.9  PLT 295   Basic Metabolic Panel  Recent Labs  11/01/14 1050  NA 137  K 5.2*  CL 103  CO2 28  GLUCOSE 105*  BUN 21*  CREATININE 1.39*  CALCIUM 9.3     TELE NSR without significant ventricular ectopy    ECG  No new EKG  Echocardiogram 10/21/2014  LV EF: 40% -  45%  ------------------------------------------------------------------- Indications:   Chest pain 786.51.  ------------------------------------------------------------------- History:  Risk factors: NSTEMI. Elevated troponin.  ------------------------------------------------------------------- Study Conclusions  - Left ventricle: The cavity size was normal. Wall thickness was normal. Systolic function was mildly to moderately reduced. The estimated ejection fraction was in the range of 40% to 45%. There is akinesis of the mid-apicalanteroseptal, lateral, and apical myocardium. Doppler parameters are consistent with abnormal left ventricular relaxation (grade 1 diastolic dysfunction). - Inferior vena cava: The vessel was dilated. The respirophasic diameter changes were blunted (< 50%), consistent with elevated central venous pressure.    Radiology/Studies  Dg Chest 2 View  11/01/2014   CLINICAL DATA:  Three-day history of chest tightness  EXAM: CHEST  2 VIEW  COMPARISON:  October 20, 2014  FINDINGS: Lungs are clear. Heart size and pulmonary vascularity are normal. No adenopathy. No pneumothorax. There is slight anterior wedging of a mid thoracic vertebral body, stable.  IMPRESSION: No edema or consolidation.   Electronically Signed   By: Bretta BangWilliam  Woodruff III M.D.   On: 11/01/2014 11:31   Dg Chest 2 View  10/20/2014   CLINICAL DATA:  Cough. Congestion. Upper respiratory infection for 2 weeks. Chest tightness. Shortness of breath.  EXAM: CHEST  2 VIEW  COMPARISON:  None.  FINDINGS: Thoracic spondylosis.  The lungs appear clear. Cardiac and mediastinal contours normal. No pleural effusion identified.  IMPRESSION: Thoracic spondylosis.  Otherwise negative.   Electronically Signed   By: Gaylyn Rong M.D.   On: 10/20/2014 15:49    ASSESSMENT AND  PLAN  1. Recurrent chest pain  - relook cath 11/01/2014 due to persistent CP and inferior lead changes on EKG, 20% ost LAD and prox LAD, 20% distal LAD  - medical management. Continue ASA, lipitor, metoprolol, and brilinta. Restart home lisinopril after discharge. EF 55-65% on cath by visual estimate (previously 40-45% by Echo 6/18). Unfortunately, unable to add Imdur given BP  - likely can be discharged today, need short term disability paper filled out, pt works for Emergency planning/management officer. Instructed him to not lift anything >5lbs for 1 week  2. CAD s/p recent DES to LAD   - Echo 6/18/206 EF 40-45%  - Cath for NSTEMI 10/23/2014 95% mid LAD treated with Synergy DES 2.5x92mm   3. HTN 4. HLD 5. AKI: recheck BMET prior to discharge.  Ramond Dial PA-C Pager: [phone]  Personally seen and examined. Agree with above. OK for DC. Cath reassuring Donato Schultz, MD

## 2014-11-02 NOTE — Discharge Summary (Signed)
Discharge Summary   Patient ID: Lee Taylor,  MRN: [id number], DOB/AGE: 59/08/1955 59 y.o.  Admit date: 11/01/2014 Discharge date: 11/02/2014  Primary Care Provider: METHENEY,CATHERINE Primary Cardiologist: Dr. Jens Somrenshaw  Discharge Diagnoses Principal Problem:   Chest tightness Active Problems:   Essential hypertension, benign   GERD (gastroesophageal reflux disease)   DCM (dilated cardiomyopathy)   CAD (coronary artery disease), native coronary artery   Unstable angina   Allergies Allergies  Allergen Reactions  . Amoxicillin     Procedures  Cardiac catheterization 11/01/2014 Conclusion     Ost LAD to Prox LAD lesion, 20% stenosed.  Dist LAD-1 lesion, 20% stenosed.  Dist LAD-2 lesion, 70% stenosed.  Mid RCA lesion, 20% stenosed.  The left ventricular systolic function is normal.  Normal LV function with an ejection fraction improved to 55% with mild residual mid anterior hypocontractility.  Mild coronary obstructive disease with smooth 20% proximal LAD narrowing, widely patent proximal to mid LAD stent, 20% mid LAD smooth narrowing, and focal 70% apical LAD stenosis in a small caliber apical LAD vessel; normal ramus intermediate and left circumflex coronary artery; and smooth 20% narrowing in the mid RCA.  RECOMMENDATION:  Medical therapy. I suspect his inferior ECG changes are related to the distal wraparound LAD which supplies the inferoapical wall.       Hospital Course  The patient is a 59 year old male with PMH of HTN, CAD, and GERD. He was recently admitted to [facility] for non-ST elevated MI and underwent cardiac catheterization on 10/23/2014 which showed 95% mid LAD stenosis treated with Synergy DES 2.5 x 20 mm. He had a recent echo on 10/21/2014 which showed EF 40-45%. He was seen in the office for follow-up on 11/01/2014, at which time he complained of recurrent chest pressure. An EKG was obtained in the office which showed mild ST elevation  in the inferior lead which appears to be new. He was admitted to [facility] for relook cath.  He underwent relook cath on the same day of arrival, which showed patent mid LAD stent, 20% ostial LAD, 20% distal LAD. It was suspected that his inferior EKG changes are related to distal wraparound LAD which supplied to inferolateral apical wall His EF during cath was 55%, which appears to have improved from the previous echo. Medical management was recommended. Patient was kept overnight for observation. He was seen on the following morning on 11/02/2014, at which time his radial cath site appears to be stable without significant bleeding or hematoma. He denies significant chest discomfort or shortness of breath. According to the patient, he has been having intermittent chest pressure for several days, however did not notice actual chest pain. Cardiac cath result reassuring. A BMRT was obtained prior to discharge which showed stable creatinine post cath. Patient is deemed stable for discharge from cardiology perspective. I have sent a staff email to our scheduler at [facility] who will contact the patient to arrange 1 month follow-up.   Discharge Vitals Blood pressure 117/75, pulse 65, temperature 98 F (36.7 C), temperature source Oral, resp. rate 20, weight 185 lb 3 oz (84 kg), SpO2 98 %.  Filed Weights   11/02/14 0001  Weight: 185 lb 3 oz (84 kg)    Labs  CBC  Recent Labs  11/01/14 1050  WBC 9.6  HGB 16.0  HCT 46.5  MCV 91.9  PLT 295   Basic Metabolic Panel  Recent Labs  11/01/14 1050 11/02/14 0835  NA 137 137  K 5.2* 3.6  CL 103 102  CO2 28 26  GLUCOSE 105* 112*  BUN 21* 18  CREATININE 1.39* 1.09  CALCIUM 9.3 9.0    Disposition  Pt is being discharged home today in good condition.  Follow-up Plans & Appointments      Follow-up Information    Follow up with Olga Millers, MD.   Specialty:  Cardiology   Why:  Office scheduler will contact you to arrange  outpatient followup   Contact information:   [address] [place] [address] [phone]       Discharge Medications    Medication List    TAKE these medications        aspirin 81 MG tablet  Take 81 mg by mouth daily.     atorvastatin 80 MG tablet  Commonly known as:  LIPITOR  Take 1 tablet (80 mg total) by mouth daily at 6 PM.     Calcium Carb-Cholecalciferol 600-800 MG-UNIT Tabs  Take 1 tablet by mouth daily.     CINNAMON PO  Take 1 tablet by mouth daily.     Ginkgo Biloba 40 MG Tabs  Take 60 mg by mouth daily.     lisinopril 20 MG tablet  Commonly known as:  PRINIVIL,ZESTRIL  TAKE ONE TABLET BY MOUTH ONCE DAILY.     metoprolol tartrate 25 MG tablet  Commonly known as:  LOPRESSOR  Take 0.5 tablets (12.5 mg total) by mouth 2 (two) times daily.     multivitamin capsule  Take 1 capsule by mouth daily.     nitroGLYCERIN 0.4 MG SL tablet  Commonly known as:  NITROSTAT  Place 1 tablet (0.4 mg total) under the tongue every 5 (five) minutes as needed for chest pain.     polycarbophil 625 MG tablet  Commonly known as:  FIBERCON  Take 625 mg by mouth daily.     ranitidine 150 MG tablet  Commonly known as:  ZANTAC  Take 150 mg by mouth 2 (two) times daily.     ticagrelor 90 MG Tabs tablet  Commonly known as:  BRILINTA  Take 1 tablet (90 mg total) by mouth 2 (two) times daily.     vitamin C 500 MG tablet  Commonly known as:  ASCORBIC ACID  Take 500 mg by mouth daily.        Duration of Discharge Encounter   Greater than 30 minutes including physician time.  Ramond Dial PA-C Pager: [phone] 11/02/2014, 10:43 AM

## 2014-11-02 NOTE — Telephone Encounter (Signed)
Closed encounter °

## 2014-11-05 ENCOUNTER — Emergency Department (HOSPITAL_BASED_OUTPATIENT_CLINIC_OR_DEPARTMENT_OTHER): Payer: BLUE CROSS/BLUE SHIELD

## 2014-11-05 ENCOUNTER — Encounter (HOSPITAL_BASED_OUTPATIENT_CLINIC_OR_DEPARTMENT_OTHER): Payer: Self-pay | Admitting: Emergency Medicine

## 2014-11-05 ENCOUNTER — Observation Stay (HOSPITAL_BASED_OUTPATIENT_CLINIC_OR_DEPARTMENT_OTHER)
Admission: EM | Admit: 2014-11-05 | Discharge: 2014-11-05 | Disposition: A | Discharge status: Home / Self Care | Payer: BLUE CROSS/BLUE SHIELD | Attending: Cardiology | Admitting: Cardiology

## 2014-11-05 DIAGNOSIS — I10 Essential (primary) hypertension: Secondary | ICD-10-CM | POA: Diagnosis not present

## 2014-11-05 DIAGNOSIS — R079 Chest pain, unspecified: Principal | ICD-10-CM | POA: Diagnosis present

## 2014-11-05 DIAGNOSIS — N179 Acute kidney failure, unspecified: Secondary | ICD-10-CM

## 2014-11-05 DIAGNOSIS — R52 Pain, unspecified: Secondary | ICD-10-CM

## 2014-11-05 DIAGNOSIS — I208 Other forms of angina pectoris: Secondary | ICD-10-CM | POA: Diagnosis present

## 2014-11-05 DIAGNOSIS — Z955 Presence of coronary angioplasty implant and graft: Secondary | ICD-10-CM

## 2014-11-05 DIAGNOSIS — I2510 Atherosclerotic heart disease of native coronary artery without angina pectoris: Secondary | ICD-10-CM | POA: Diagnosis present

## 2014-11-05 DIAGNOSIS — I2089 Other forms of angina pectoris: Secondary | ICD-10-CM | POA: Diagnosis present

## 2014-11-05 DIAGNOSIS — I25118 Atherosclerotic heart disease of native coronary artery with other forms of angina pectoris: Secondary | ICD-10-CM

## 2014-11-05 LAB — CBC WITH DIFFERENTIAL/PLATELET
Basophils Absolute: 0 10*3/uL (ref 0.0–0.1)
Basophils Relative: 0 % (ref 0–1)
EOS ABS: 0.2 10*3/uL (ref 0.0–0.7)
EOS PCT: 2 % (ref 0–5)
HEMATOCRIT: 46.1 % (ref 39.0–52.0)
Hemoglobin: 15.5 g/dL (ref 13.0–17.0)
LYMPHS PCT: 17 % (ref 12–46)
Lymphs Abs: 1.9 10*3/uL (ref 0.7–4.0)
MCH: 31.3 pg (ref 26.0–34.0)
MCHC: 33.6 g/dL (ref 30.0–36.0)
MCV: 93.1 fL (ref 78.0–100.0)
Monocytes Absolute: 1 10*3/uL (ref 0.1–1.0)
Monocytes Relative: 10 % (ref 3–12)
Neutro Abs: 7.6 10*3/uL (ref 1.7–7.7)
Neutrophils Relative %: 71 % (ref 43–77)
PLATELETS: 266 10*3/uL (ref 150–400)
RBC: 4.95 MIL/uL (ref 4.22–5.81)
RDW: 12.9 % (ref 11.5–15.5)
WBC: 10.8 10*3/uL — ABNORMAL HIGH (ref 4.0–10.5)

## 2014-11-05 LAB — BASIC METABOLIC PANEL
Anion gap: 10 (ref 5–15)
Anion gap: 9 (ref 5–15)
BUN: 21 mg/dL — AB (ref 6–20)
BUN: 16 mg/dL (ref 6–20)
CO2: 27 mmol/L (ref 22–32)
CO2: 23 mmol/L (ref 22–32)
Calcium: 9.1 mg/dL (ref 8.9–10.3)
Calcium: 8.7 mg/dL — ABNORMAL LOW (ref 8.9–10.3)
Chloride: 101 mmol/L (ref 101–111)
Chloride: 107 mmol/L (ref 101–111)
Creatinine, Ser: 1.36 mg/dL — ABNORMAL HIGH (ref 0.61–1.24)
Creatinine, Ser: 1.16 mg/dL (ref 0.61–1.24)
GFR calc Af Amer: >60 mL/min (ref >60)
GFR calc non Af Amer: 55 mL/min — ABNORMAL LOW (ref >60)
GFR calc non Af Amer: >60 mL/min (ref >60)
Glucose, Bld: 111 mg/dL — ABNORMAL HIGH (ref 65–99)
Glucose, Bld: 118 mg/dL — ABNORMAL HIGH (ref 65–99)
POTASSIUM: 4 mmol/L (ref 3.5–5.1)
Potassium: 4.1 mmol/L (ref 3.5–5.1)
Sodium: 138 mmol/L (ref 135–145)
Sodium: 139 mmol/L (ref 135–145)

## 2014-11-05 LAB — TROPONIN I: Troponin I: <0.03 ng/mL (ref <0.031)

## 2014-11-05 MED ORDER — NITROGLYCERIN 0.4 MG SL SUBL
0.4000 mg | SUBLINGUAL_TABLET | SUBLINGUAL | Status: AC | PRN
  Administered 2014-11-05 (×3): 0.4 mg via SUBLINGUAL

## 2014-11-05 MED ORDER — PANTOPRAZOLE SODIUM 40 MG PO TBEC
40.0000 mg | DELAYED_RELEASE_TABLET | Freq: Every day | ORAL | Status: DC
  Administered 2014-11-05: 40 mg via ORAL
  Filled 2014-11-05: qty 1

## 2014-11-05 MED ORDER — NITROGLYCERIN 0.4 MG SL SUBL
SUBLINGUAL_TABLET | SUBLINGUAL | Status: AC
  Administered 2014-11-05: 0.4 mg via SUBLINGUAL
  Filled 2014-11-05: qty 1

## 2014-11-05 MED ORDER — ISOSORBIDE MONONITRATE ER 30 MG PO TB24: 30.0000 mg | ORAL_TABLET | Freq: Every day | ORAL | Status: DC

## 2014-11-05 MED ORDER — METOPROLOL TARTRATE 25 MG PO TABS
25.0000 mg | ORAL_TABLET | Freq: Two times a day (BID) | ORAL | Status: DC
  Administered 2014-11-05: 25 mg via ORAL
  Filled 2014-11-05: qty 1

## 2014-11-05 MED ORDER — PANTOPRAZOLE SODIUM 40 MG PO TBEC: 40.0000 mg | DELAYED_RELEASE_TABLET | Freq: Every day | ORAL | Status: DC

## 2014-11-05 MED ORDER — ASPIRIN 81 MG PO CHEW
324.0000 mg | CHEWABLE_TABLET | Freq: Once | ORAL | Status: AC
  Administered 2014-11-05: 324 mg via ORAL

## 2014-11-05 MED ORDER — ENOXAPARIN SODIUM 40 MG/0.4ML ~~LOC~~ SOLN: 40.0000 mg | SUBCUTANEOUS | Status: DC

## 2014-11-05 MED ORDER — ATORVASTATIN CALCIUM 80 MG PO TABS: 80.0000 mg | ORAL_TABLET | Freq: Every day | ORAL | Status: DC

## 2014-11-05 MED ORDER — TICAGRELOR 90 MG PO TABS
90.0000 mg | ORAL_TABLET | Freq: Two times a day (BID) | ORAL | Status: DC
  Administered 2014-11-05: 90 mg via ORAL
  Filled 2014-11-05: qty 1

## 2014-11-05 MED ORDER — SODIUM CHLORIDE 0.9 % IJ SOLN
3.0000 mL | Freq: Two times a day (BID) | INTRAMUSCULAR | Status: DC
  Administered 2014-11-05: 3 mL via INTRAVENOUS

## 2014-11-05 MED ORDER — NITROGLYCERIN 2 % TD OINT
0.5000 [in_us] | TOPICAL_OINTMENT | Freq: Once | TRANSDERMAL | Status: AC
  Administered 2014-11-05: 0.5 [in_us] via TOPICAL
  Filled 2014-11-05: qty 1

## 2014-11-05 MED ORDER — ISOSORBIDE MONONITRATE ER 30 MG PO TB24
30.0000 mg | ORAL_TABLET | Freq: Every day | ORAL | Status: DC
  Administered 2014-11-05: 30 mg via ORAL
  Filled 2014-11-05: qty 1

## 2014-11-05 MED ORDER — SODIUM CHLORIDE 0.9 % IV BOLUS (SEPSIS)
500.0000 mL | Freq: Once | INTRAVENOUS | Status: AC
  Administered 2014-11-05: 500 mL via INTRAVENOUS

## 2014-11-05 MED ORDER — ASPIRIN EC 81 MG PO TBEC
81.0000 mg | DELAYED_RELEASE_TABLET | Freq: Every day | ORAL | Status: DC
  Administered 2014-11-05: 81 mg via ORAL
  Filled 2014-11-05: qty 1

## 2014-11-05 MED ORDER — ASPIRIN 81 MG PO CHEW
CHEWABLE_TABLET | ORAL | Status: AC
  Administered 2014-11-05: 324 mg via ORAL
  Filled 2014-11-05: qty 4

## 2014-11-05 MED ORDER — LISINOPRIL 20 MG PO TABS: 20.0000 mg | ORAL_TABLET | Freq: Every day | ORAL | Status: DC

## 2014-11-05 NOTE — ED Notes (Signed)
Pt and wife verbalized understanding of NPO status

## 2014-11-05 NOTE — Discharge Summary (Signed)
Physician Discharge Summary     Cardiologist: Crenshaw Patient ID: Lee Taylor MRN: [id number] DOB/AGE: 59/08/1955 59 y.o.  Admit date: 11/05/2014 Discharge date: 11/05/2014  Admission Diagnoses:   Chest pain  Discharge Diagnoses:  Principal Problem:   Chest pain Active Problems:   Essential hypertension, benign   CAD (coronary artery disease), native coronary artery   Angina at rest   AKI  Discharged Condition: stable  Hospital Course:   59 yrs old man with pmh of NSTEMI and CAD s/p DES synergy 2.5x20 mm in LAD EF 45% last month with residual 70% distal LAD small vessel not amenable to PCI, HTN, GERD readmitted and no change on LHC presented to [place] ED with c/o right sided chest pain different from his ACS presentation. In ED his initial trop was negative but EKG had diffuse ST inversion . He was treated with nitro paste and his pain relieved. Cardiology was called due to diffuse ST changes and he was transferred to [facility]. Currently back to baseline no chest pain. Takes his med regularly   He was admitted for observation and ruled out for MI. No acute EKG changes.  Noncardiac CP.  Protonix and imdur 30mg  added.  CP resolved.  SCr improved.  Lisinopril resumed at DC.  The patient was seen by Dr. Darl HouseholderKoneswaren who felt he was stable for DC home.    Consults: None  Significant Diagnostic Studies:  PORTABLE CHEST - 1 VIEW  COMPARISON: None.  FINDINGS: A single AP portable view of the chest demonstrates no focal airspace consolidation or alveolar edema. The lungs are grossly clear. There is no large effusion or pneumothorax. Cardiac and mediastinal contours appear unremarkable.  IMPRESSION: No active disease. Treatments: See above  Discharge Exam: Blood pressure 106/67, pulse 64, temperature 98.2 F (36.8 C), temperature source Oral, resp. rate 15, height 5' 9.5" (1.765 m), weight 180 lb 3.2 oz (81.738 kg), SpO2 96 %.   Disposition: 01-Home or Self Care      Discharge Instructions    Diet - low sodium heart healthy    Complete by:  As directed      Increase activity slowly    Complete by:  As directed             Medication List    TAKE these medications        aspirin 81 MG tablet  Take 81 mg by mouth daily.     atorvastatin 80 MG tablet  Commonly known as:  LIPITOR  Take 1 tablet (80 mg total) by mouth daily at 6 PM.     Calcium Carb-Cholecalciferol 600-800 MG-UNIT Tabs  Take 1 tablet by mouth daily.     CINNAMON PO  Take 1 tablet by mouth daily.     Ginkgo Biloba 40 MG Tabs  Take 60 mg by mouth daily.     isosorbide mononitrate 30 MG 24 hr tablet  Commonly known as:  IMDUR  Take 1 tablet (30 mg total) by mouth daily.     lisinopril 20 MG tablet  Commonly known as:  PRINIVIL,ZESTRIL  TAKE ONE TABLET BY MOUTH ONCE DAILY.     metoprolol tartrate 25 MG tablet  Commonly known as:  LOPRESSOR  Take 0.5 tablets (12.5 mg total) by mouth 2 (two) times daily.     multivitamin capsule  Take 1 capsule by mouth daily.     nitroGLYCERIN 0.4 MG SL tablet  Commonly known as:  NITROSTAT  Place 1 tablet (0.4 mg total) under  the tongue every 5 (five) minutes as needed for chest pain.     pantoprazole 40 MG tablet  Commonly known as:  PROTONIX  Take 1 tablet (40 mg total) by mouth daily.     polycarbophil 625 MG tablet  Commonly known as:  FIBERCON  Take 625 mg by mouth daily.     ranitidine 150 MG tablet  Commonly known as:  ZANTAC  Take 150 mg by mouth 2 (two) times daily.     ticagrelor 90 MG Tabs tablet  Commonly known as:  BRILINTA  Take 1 tablet (90 mg total) by mouth 2 (two) times daily.     vitamin C 500 MG tablet  Commonly known as:  ASCORBIC ACID  Take 500 mg by mouth daily.       Follow-up Information    Follow up with Olga Millers, MD On 12/04/2014.   Specialty:  Cardiology   Why:  2:15 PM   Contact information:   [address] [place] [address] [phone]      Greater than 30  minutes was spent completing the patient's discharge.    SignedWilburt Finlay, PAC 11/05/2014, 9:43 AM

## 2014-11-05 NOTE — ED Provider Notes (Signed)
CSN: [id number]     Arrival date & time 11/05/14  0049 History   First MD Initiated Contact with Patient 11/05/14 0058     Chief Complaint  Patient presents with  . Chest Pain     (Consider location/radiation/quality/duration/timing/severity/associated sxs/prior Treatment) Patient is a 59 y.o. male presenting with chest pain. The history is provided by the patient.  Chest Pain Pain location:  R chest Pain quality: dull   Pain radiates to:  Does not radiate Pain radiates to the back: no   Pain severity:  Severe Onset quality:  Sudden Timing:  Constant Progression:  Improving Chronicity:  Recurrent Context: at rest   Context: not breathing   Relieved by:  Nitroglycerin (partial relief) Worsened by:  Nothing tried Ineffective treatments:  None tried Associated symptoms: no abdominal pain, no cough, no fever and no palpitations   Risk factors: male sex   Risk factors: no Ehlers-Danlos syndrome     Past Medical History  Diagnosis Date  . Hypertension   . Coronary artery disease 10/2014    a. cath 10/23/2014 2.5 x 20 Synergy DES to the LAD, EF 40-45 percent   b. relook cath 6/29, patent stents, medical management  . GERD (gastroesophageal reflux disease)   . Hypercholesterolemia   . NSTEMI (non-ST elevated myocardial infarction) 10/20/2014    2.5 x 20 Synergy DES to the LAD, EF 40-45 percent  . Kidney stones     "passed it"  . Acute kidney injury 10/23/2014    "related to the MI"   Past Surgical History  Procedure Laterality Date  . Tonsillectomy    . Tympanoplasty Left 1960's    "perforated eardrum"  . Coronary angioplasty with stent placement  10/23/2014    2.5 x 20 Synergy DES to the LAD  . Cardiac catheterization N/A 10/23/2014    Procedure: Left Heart Cath and Coronary Angiography;  Surgeon: Corky CraftsJayadeep S Varanasi, MD; LAD 95%, thrombotic, small ramus intermedius, CFX and OM branch is patent, RCA 20%     . Cardiac catheterization N/A 10/23/2014    Procedure: Coronary  Stent Intervention;  Surgeon: Corky CraftsJayadeep S Varanasi, MD; 2.5 x 20 Synergy DES to the LAD    . Cardiac catheterization  11/01/2014  . Cardiac catheterization N/A 11/01/2014    Procedure: Left Heart Cath and Coronary Angiography;  Surgeon: Lennette Biharihomas A Kelly, MD;  [facility] CV LAB;  Service: Cardiovascular;  Laterality: N/A;   Family History  Problem Relation Age of Onset  . Heart failure Mother   . Heart attack Sister 4848    smoker    . Heart disease Mother     triple bypass   . Stroke Father    History  Substance Use Topics  . Smoking status: Never Smoker   . Smokeless tobacco: Never Used  . Alcohol Use: Yes     Comment: 11/01/2014 "maybe a beer q 2 wks"    Review of Systems  Constitutional: Negative for fever.  Respiratory: Negative for cough.   Cardiovascular: Positive for chest pain. Negative for palpitations and leg swelling.  Gastrointestinal: Negative for abdominal pain.  All other systems reviewed and are negative.     Allergies  Amoxicillin  Home Medications   Prior to Admission medications   Medication Sig Start Date End Date Taking? Authorizing Provider  aspirin 81 MG tablet Take 81 mg by mouth daily.      Historical Provider, MD  atorvastatin (LIPITOR) 80 MG tablet Take 1 tablet (80 mg total) by mouth  daily at 6 PM. 10/24/14   Joline Salt Barrett, PA-C  Calcium Carb-Cholecalciferol 600-800 MG-UNIT TABS Take 1 tablet by mouth daily.     Historical Provider, MD  CINNAMON PO Take 1 tablet by mouth daily.     Historical Provider, MD  Ginkgo Biloba 40 MG TABS Take 60 mg by mouth daily.     Historical Provider, MD  lisinopril (PRINIVIL,ZESTRIL) 20 MG tablet TAKE ONE TABLET BY MOUTH ONCE DAILY. 05/31/14   Laren Boom, DO  metoprolol tartrate (LOPRESSOR) 25 MG tablet Take 0.5 tablets (12.5 mg total) by mouth 2 (two) times daily. 10/24/14   Joline Salt Barrett, PA-C  Multiple Vitamin (MULTIVITAMIN) capsule Take 1 capsule by mouth daily.      Historical Provider, MD   nitroGLYCERIN (NITROSTAT) 0.4 MG SL tablet Place 1 tablet (0.4 mg total) under the tongue every 5 (five) minutes as needed for chest pain. 10/24/14   Rhonda G Barrett, PA-C  polycarbophil (FIBERCON) 625 MG tablet Take 625 mg by mouth daily.    Historical Provider, MD  ranitidine (ZANTAC) 150 MG tablet Take 150 mg by mouth 2 (two) times daily.      Historical Provider, MD  ticagrelor (BRILINTA) 90 MG TABS tablet Take 1 tablet (90 mg total) by mouth 2 (two) times daily. 10/24/14   Rhonda G Barrett, PA-C  vitamin C (ASCORBIC ACID) 500 MG tablet Take 500 mg by mouth daily.    Historical Provider, MD   BP 101/63 mmHg  Pulse 80  Resp 18  SpO2 96% Physical Exam  Constitutional: He is oriented to person, place, and time. He appears well-developed and well-nourished.  HENT:  Head: Normocephalic and atraumatic.  Mouth/Throat: Oropharynx is clear and moist.  Eyes: Conjunctivae are normal. Pupils are equal, round, and reactive to light.  Neck: Normal range of motion. Neck supple.  Cardiovascular: Normal rate, regular rhythm and intact distal pulses.   Pulmonary/Chest: Effort normal and breath sounds normal. No respiratory distress. He has no wheezes. He has no rales.  Abdominal: Soft. Bowel sounds are normal. There is no tenderness. There is no rebound and no guarding.  Musculoskeletal: Normal range of motion. He exhibits no edema.  Neurological: He is alert and oriented to person, place, and time. He has normal reflexes.  Skin: Skin is warm and dry. He is not diaphoretic.  Psychiatric: He has a normal mood and affect.    ED Course  Procedures (including critical care time) Labs Review Labs Reviewed  CBC WITH DIFFERENTIAL/PLATELET - Abnormal; Notable for the following:    WBC 10.8 (*)    All other components within normal limits  BASIC METABOLIC PANEL - Abnormal; Notable for the following:    Glucose, Bld 111 (*)    BUN 21 (*)    Creatinine, Ser 1.36 (*)    GFR calc non Af Amer 55 (*)     All other components within normal limits  TROPONIN I    Imaging Review No results found.   EKG Interpretation   Date/Time:  Sunday November 05 2014 00:57:58 EDT Ventricular Rate:  68 PR Interval:  140 QRS Duration: 98 QT Interval:  452 QTC Calculation: 480 R Axis:   76 Text Interpretation:  Normal sinus rhythm Low voltage QRS Incomplete right  bundle branch block Marked T wave abnormality, consider anterolateral  ischemia Confirmed by [facility]  MD, Adlean Hardeman ([id number]) on 11/05/2014  12:59:56 AM      MDM   Final diagnoses:  Pain    Results for orders  placed or performed during the hospital encounter of 11/05/14  CBC with Differential/Platelet  Result Value Ref Range   WBC 10.8 (H) 4.0 - 10.5 K/uL   RBC 4.95 4.22 - 5.81 MIL/uL   Hemoglobin 15.5 13.0 - 17.0 g/dL   HCT 16.1 09.6 - 04.5 %   MCV 93.1 78.0 - 100.0 fL   MCH 31.3 26.0 - 34.0 pg   MCHC 33.6 30.0 - 36.0 g/dL   RDW 40.9 81.1 - 91.4 %   Platelets 266 150 - 400 K/uL   Neutrophils Relative % 71 43 - 77 %   Neutro Abs 7.6 1.7 - 7.7 K/uL   Lymphocytes Relative 17 12 - 46 %   Lymphs Abs 1.9 0.7 - 4.0 K/uL   Monocytes Relative 10 3 - 12 %   Monocytes Absolute 1.0 0.1 - 1.0 K/uL   Eosinophils Relative 2 0 - 5 %   Eosinophils Absolute 0.2 0.0 - 0.7 K/uL   Basophils Relative 0 0 - 1 %   Basophils Absolute 0.0 0.0 - 0.1 K/uL  Basic metabolic panel  Result Value Ref Range   Sodium 138 135 - 145 mmol/L   Potassium 4.0 3.5 - 5.1 mmol/L   Chloride 101 101 - 111 mmol/L   CO2 27 22 - 32 mmol/L   Glucose, Bld 111 (H) 65 - 99 mg/dL   BUN 21 (H) 6 - 20 mg/dL   Creatinine, Ser 7.82 (H) 0.61 - 1.24 mg/dL   Calcium 9.1 8.9 - 95.6 mg/dL   GFR calc non Af Amer 55 (L) >60 mL/min   GFR calc Af Amer >60 >60 mL/min   Anion gap 10 5 - 15  Troponin I  Result Value Ref Range   Troponin I <0.03 <0.031 ng/mL   Dg Chest 2 View  11/01/2014   CLINICAL DATA:  Three-day history of chest tightness  EXAM: CHEST  2 VIEW  COMPARISON:  October 20, 2014  FINDINGS: Lungs are clear. Heart size and pulmonary vascularity are normal. No adenopathy. No pneumothorax. There is slight anterior wedging of a mid thoracic vertebral body, stable.  IMPRESSION: No edema or consolidation.   Electronically Signed   By: Bretta Bang III M.D.   On: 11/01/2014 11:31   Dg Chest 2 View  10/20/2014   CLINICAL DATA:  Cough. Congestion. Upper respiratory infection for 2 weeks. Chest tightness. Shortness of breath.  EXAM: CHEST  2 VIEW  COMPARISON:  None.  FINDINGS: Thoracic spondylosis.  The lungs appear clear. Cardiac and mediastinal contours normal. No pleural effusion identified.  IMPRESSION: Thoracic spondylosis.  Otherwise negative.   Electronically Signed   By: Gaylyn Rong M.D.   On: 10/20/2014 15:49    Medications  nitroGLYCERIN (NITROGLYN) 2 % ointment 0.5 inch (not administered)  aspirin chewable tablet 324 mg (324 mg Oral Given 11/05/14 0112)  sodium chloride 0.9 % bolus 500 mL (500 mLs Intravenous New Bag/Given 11/05/14 0117)  nitroGLYCERIN (NITROSTAT) SL tablet 0.4 mg (0.4 mg Sublingual Given 11/05/14 0156)     Per cardiology no heparin at this time admit to step down  Shaunessy Dobratz, MD 11/05/14 2130

## 2014-11-05 NOTE — Progress Notes (Signed)
[facility]. Assisted pt to bed. VS obtained. Pt weighed. CHG bath administered.

## 2014-11-05 NOTE — Progress Notes (Signed)
    Subjective: CP resolved  Objective: Vital signs in last 24 hours: Temp:  [97.6 F (36.4 C)-98.5 F (36.9 C)] 97.6 F (36.4 C) (07/03 0523) Pulse Rate:  [66-80] 68 (07/03 0650) Resp:  [11-18] 12 (07/03 0650) BP: (101-132)/(57-72) 101/57 mmHg (07/03 0650) SpO2:  [96 %-100 %] 97 % (07/03 0650) Weight:  [180 lb 3.2 oz (81.738 kg)] 180 lb 3.2 oz (81.738 kg) (07/03 0523) Last BM Date: 11/04/14  Intake/Output from previous day:   Intake/Output this shift:    Medications Scheduled Meds: . aspirin EC  81 mg Oral Daily  . atorvastatin  80 mg Oral q1800  . isosorbide mononitrate  30 mg Oral Daily  . metoprolol tartrate  25 mg Oral BID  . pantoprazole  40 mg Oral Daily  . sodium chloride  3 mL Intravenous Q12H  . ticagrelor  90 mg Oral BID   Continuous Infusions:  PRN Meds:.  PE: General appearance: alert, cooperative and no distress Lungs: clear to auscultation bilaterally Heart: regular rate and rhythm, S1, S2 normal, no murmur, click, rub or gallop Extremities: No LEE Pulses: 2+ and symmetric Skin: Warm and dry Neurologic: Grossly normal  Lab Results:   Recent Labs  11/05/14 0103  WBC 10.8*  HGB 15.5  HCT 46.1  PLT 266   BMET  Recent Labs  11/02/14 0835 11/05/14 0103  NA 137 138  K 3.6 4.0  CL 102 101  CO2 26 27  GLUCOSE 112* 111*  BUN 18 21*  CREATININE 1.09 1.36*  CALCIUM 9.0 9.1    Assessment/Plan 59 yrs old man with pmh of NSTEMI, 10/23/14, and CAD s/p DES synergy 2.5x20 mm in LAD EF 45% with residual 70% distal LAD small vessel not amenable to PCI, HTN, GERD readmitted 6/29 and no change on LHC.  He presented to [place] ED with c/o right sided chest pain different from his ACS presentation.    Principal Problem:   Chest pain Active Problems:   Essential hypertension, benign   CAD (coronary artery disease), native coronary artery   Angina at rest   Acute KI  Troponin negative times two.  EKG unchanged from 6/29.  ASA, brilinta, Imdur  30(added yesterday), lopressor 25, protonix.  BP stable.  CP resolved.  He works at [employer], very heavy work, out until August.  SCr mildly elevated.  Increase PO intake.   DC home today after ambulation.     LOS: 0 days    HAGER, BRYAN PA-C 11/05/2014 7:57 AM  The patient was seen and examined, and I agree with the assessment and plan as documented above. Admitted with chest pain (atypical) and ruled out for ACS. ECG without significant changes from prior. Imdur added. Will d/c today.

## 2014-11-05 NOTE — ED Notes (Signed)
Pt leaving at this time to bed at [facility] room 3W-11C. Pt is currently  Pain free and alert x3 with skin P/W/D VSS

## 2014-11-05 NOTE — Progress Notes (Signed)
Discharge teaching and home instructions reviewed with pt. VSS. Pt will be discharging home with wife around 1300

## 2014-11-05 NOTE — H&P (Signed)
C.c transfer from [place] [facility] for chest pain HPI:  59 yrs old man with pmh of NSTEMI and CAD s/p DES synergy 2.5x20 mm in LAD EF 45% last month with residual 70% distal LAD small vessel not amenable to PCI, HTN, GERD readmitted and no change on LHC presented to [place] [facility] with c/o right sided chest pain different from his ACS presentation. In ED his initial trop was negative but EKG had diffuse ST inversion . He was treated with nitro paste and his pain relieved. Cardiology was called due to diffuse ST changes and he was transferred to [facility]. Currently back to baseline no chest pain. Takes his med regularly  On reviewing his EKGs his EKG is not much different  Review of Systems:     Cardiac Review of Systems: {Y] = yes  = no  Chest Pain [   y ]  Resting SOB [   ] Exertional SOB  [  ]  Orthopnea [  ]   Pedal Edema [   ]    Palpitations [  ] Syncope  [  ]   Presyncope [   ]  General Review of Systems: [Y] = yes [  ]=no Constitional: recent weight change [  ]; anorexia [  ]; fatigue [  ]; nausea [  ]; night sweats [  ]; fever [  ]; or chills [  ];                                                                     Dental: poor dentition[  ];   Eye : blurred vision [  ]; diplopia [   ]; vision changes [  ];  Amaurosis fugax[  ]; Resp: cough [  ];  wheezing[  ];  hemoptysis[  ]; shortness of breath[  ]; paroxysmal nocturnal dyspnea[  ]; dyspnea on exertion[  ]; or orthopnea[  ];  GI:  gallstones[  ], vomiting[  ];  dysphagia[  ]; melena[  ];  hematochezia [  ]; heartburn[  ];   GU: kidney stones [  ]; hematuria[  ];   dysuria [  ];  nocturia[  ];               Skin: rash [  ], swelling[  ];, hair loss[  ];  peripheral edema[  ];  or itching[  ]; Musculosketetal: myalgias[  ];  joint swelling[  ];  joint erythema[  ];  joint pain[  ];  back pain[  ];  Heme/Lymph: bruising[  ];  bleeding[  ];  anemia[  ];  Neuro: TIA[  ];  headaches[  ];  stroke[  ];  vertigo[  ];  seizures[  ];    paresthesias[  ];  difficulty walking[  ];    Past Medical History  Diagnosis Date  . Hypertension   . Coronary artery disease 10/2014    a. cath 10/23/2014 2.5 x 20 Synergy DES to the LAD, EF 40-45 percent   b. relook cath 6/29, patent stents, medical management  . GERD (gastroesophageal reflux disease)   . Hypercholesterolemia   . NSTEMI (non-ST elevated myocardial infarction) 10/20/2014    2.5 x 20 Synergy DES to the LAD, EF  40-45 percent  . Kidney stones     "passed it"  . Acute kidney injury 10/23/2014    "related to the MI"    No current facility-administered medications on file prior to encounter.   Current Outpatient Prescriptions on File Prior to Encounter  Medication Sig Dispense Refill  . aspirin 81 MG tablet Take 81 mg by mouth daily.      [name]. atorvastatin (LIPITOR) 80 MG tablet Take 1 tablet (80 mg total) by mouth daily at 6 PM. 30 tablet 11  . Calcium Carb-Cholecalciferol 600-800 MG-UNIT TABS Take 1 tablet by mouth daily.     [name]. CINNAMON PO Take 1 tablet by mouth daily.     . Ginkgo Biloba 40 MG TABS Take 60 mg by mouth daily.     [name]. lisinopril (PRINIVIL,ZESTRIL) 20 MG tablet TAKE ONE TABLET BY MOUTH ONCE DAILY. 90 tablet 1  . metoprolol tartrate (LOPRESSOR) 25 MG tablet Take 0.5 tablets (12.5 mg total) by mouth 2 (two) times daily. 35 tablet 11  . Multiple Vitamin (MULTIVITAMIN) capsule Take 1 capsule by mouth daily.      . nitroGLYCERIN (NITROSTAT) 0.4 MG SL tablet Place 1 tablet (0.4 mg total) under the tongue every 5 (five) minutes as needed for chest pain. 25 tablet 3  . polycarbophil (FIBERCON) 625 MG tablet Take 625 mg by mouth daily.    . ranitidine (ZANTAC) 150 MG tablet Take 150 mg by mouth 2 (two) times daily.      . ticagrelor (BRILINTA) 90 MG TABS tablet Take 1 tablet (90 mg total) by mouth 2 (two) times daily. 60 tablet 11  . vitamin C (ASCORBIC ACID) 500 MG tablet Take 500 mg by mouth daily.        Allergies  Allergen Reactions  . Amoxicillin      History   Social History  . Marital Status: Married    Spouse Name: Gershon Cullriscilla   . Number of Children: 2  . Years of Education: N/A   Occupational History  . Museum/gallery exhibitions officerManufacturing Assembler    Social History Main Topics  . Smoking status: Never Smoker   . Smokeless tobacco: Never Used  . Alcohol Use: Yes     Comment: 11/01/2014 "maybe a beer q 2 wks"  . Drug Use: No  . Sexual Activity:    Partners: Female   Other Topics Concern  . Not on file   Social History Narrative    Family History  Problem Relation Age of Onset  . Heart failure Mother   . Heart attack Sister [id number]    smoker    . Heart disease Mother     triple bypass   . Stroke Father     PHYSICAL EXAM: Filed Vitals:   11/05/14 0523  BP: 132/71  Pulse: 72  Temp: 97.6 F (36.4 C)  Resp: 13   General:  Well appearing. No respiratory difficulty HEENT: normal Neck: supple. no JVD. Carotids 2+ bilat; no bruits. No lymphadenopathy or thryomegaly appreciated. Cor: PMI nondisplaced. Regular rate & rhythm. No rubs, gallops or murmurs. Lungs: clear Abdomen: soft, nontender, nondistended. No hepatosplenomegaly. No bruits or masses. Good bowel sounds. Extremities: no cyanosis, clubbing, rash, edema Neuro: alert & oriented x 3, cranial nerves grossly intact. moves all 4 extremities w/o difficulty. Affect pleasant.  ECG:  Results for orders placed or performed during the hospital encounter of 11/05/14 (from the past 24 hour(s))  CBC with Differential/Platelet     Status: Abnormal   Collection Time: 11/05/14  1:03  AM  Result Value Ref Range   WBC 10.8 (H) 4.0 - 10.5 K/uL   RBC 4.95 4.22 - 5.81 MIL/uL   Hemoglobin 15.5 13.0 - 17.0 g/dL   HCT 82.9 56.2 - 13.0 %   MCV 93.1 78.0 - 100.0 fL   MCH 31.3 26.0 - 34.0 pg   MCHC 33.6 30.0 - 36.0 g/dL   RDW 86.5 78.4 - 69.6 %   Platelets 266 150 - 400 K/uL   Neutrophils Relative % 71 43 - 77 %   Neutro Abs 7.6 1.7 - 7.7 K/uL   Lymphocytes Relative 17 12 - 46 %   Lymphs Abs  1.9 0.7 - 4.0 K/uL   Monocytes Relative 10 3 - 12 %   Monocytes Absolute 1.0 0.1 - 1.0 K/uL   Eosinophils Relative 2 0 - 5 %   Eosinophils Absolute 0.2 0.0 - 0.7 K/uL   Basophils Relative 0 0 - 1 %   Basophils Absolute 0.0 0.0 - 0.1 K/uL  Basic metabolic panel     Status: Abnormal   Collection Time: 11/05/14  1:03 AM  Result Value Ref Range   Sodium 138 135 - 145 mmol/L   Potassium 4.0 3.5 - 5.1 mmol/L   Chloride 101 101 - 111 mmol/L   CO2 27 22 - 32 mmol/L   Glucose, Bld 111 (H) 65 - 99 mg/dL   BUN 21 (H) 6 - 20 mg/dL   Creatinine, Ser 2.95 (H) 0.61 - 1.24 mg/dL   Calcium 9.1 8.9 - 28.4 mg/dL   GFR calc non Af Amer 55 (L) >60 mL/min   GFR calc Af Amer >60 >60 mL/min   Anion gap 10 5 - 15  Troponin I     Status: None   Collection Time: 11/05/14  1:03 AM  Result Value Ref Range   Troponin I <0.03 <0.031 ng/mL   Dg Chest Port 1 View  11/05/2014   CLINICAL DATA:  Chest pain.  Recent coronary artery stent.  EXAM: PORTABLE CHEST - 1 VIEW  COMPARISON:  None.  FINDINGS: A single AP portable view of the chest demonstrates no focal airspace consolidation or alveolar edema. The lungs are grossly clear. There is no large effusion or pneumothorax. Cardiac and mediastinal contours appear unremarkable.  IMPRESSION: No active disease.   Electronically Signed   By: Ellery Plunk M.D.   On: 11/05/2014 02:10     ASSESSMENT: Chest pain  CAD  HTN aKI Cr 1.36  PLAN/DISCUSSION: 1. R/o ACS will get another set of trop 2. If trop negative will continue home meds 3. Add imdur , has small vessel disease, spasm ? 4. Repeat BMP after hydration hold acei due to AKI for now  5. If Cr stable resume lisinopril at lower dose due to addition of imdur  6. I have explained the risks and benefits of imdur  7. If his trop is negative and he tolerates first dose of meds he can be discharged later today with outpt follow up   Seiya Silsby Alonna Minium

## 2014-11-07 ENCOUNTER — Telehealth: Payer: Self-pay | Admitting: Cardiology

## 2014-11-07 NOTE — Telephone Encounter (Signed)
Patient notified to stop zantac and only take pantoprazole. Med list updated.

## 2014-11-07 NOTE — Telephone Encounter (Signed)
D/C phone call . appt on 12/04/14 with Dr. Jens Somrenshaw

## 2014-11-07 NOTE — Telephone Encounter (Signed)
Only needs protonix.  Harman Langhans, PAC

## 2014-11-07 NOTE — Telephone Encounter (Signed)
Patient contacted regarding discharge from  on July 3rd, 2016.  [facility] provider Dr. [facility]renshaw [facility]. Patient understands discharge instructions? YES Patient understands medications and regiment? YES - had one medication question - see below Patient understands to bring all medications to this visit? YES  Patient/wife asked if patient should be on zantac and pantoprazole. Prior to hospitalization patient was taking zantac and pantoprazole as added at discharge.   Will route message to B. Leron CroakHager, PA who did discharge summary

## 2014-11-09 ENCOUNTER — Telehealth: Payer: Self-pay | Admitting: Cardiology

## 2014-11-09 NOTE — Telephone Encounter (Signed)
Received signed FMLA/Disability forms back from [place], [place] 11/09/14.  Notified patient, faxed forms to [employer] and mailed patient his copies. 11/09/14. lp

## 2014-11-18 ENCOUNTER — Other Ambulatory Visit: Payer: Self-pay | Admitting: Physician Assistant

## 2014-11-20 ENCOUNTER — Other Ambulatory Visit: Payer: Self-pay

## 2014-11-20 MED ORDER — TICAGRELOR 90 MG PO TABS: 90.0000 mg | ORAL_TABLET | Freq: Two times a day (BID) | ORAL | Status: DC

## 2014-11-30 NOTE — Progress Notes (Signed)
HPI: FU CAD; admitted 6/16 with NSTEMI; had PCI of LAD with DES. Echo 6/16 showed EF 40-45, grade 1 diastolic dysfunction. Admitted with recurrent CP and repeat cath 11/01/14 showed 20 LAD, distal 70 LAD, 20 RCA, normal LV function; medical therapy recommended. Readmitted 7/16 with CP and ruled out; since last seen, he has minimal dyspnea on exertion and some fatigue. Occasional brief chest pain but no exertional symptoms or symptoms similar to his infarct.  Current Outpatient Prescriptions  Medication Sig Dispense Refill  . aspirin 81 MG tablet Take 81 mg by mouth daily.      [name] atorvastatin (LIPITOR) 80 MG tablet Take 1 tablet (80 mg total) by mouth daily at 6 PM. 30 tablet 11  . Calcium Carb-Cholecalciferol 600-800 MG-UNIT TABS Take 1 tablet by mouth daily.     [name] CINNAMON PO Take 1 tablet by mouth daily.     . Ginkgo Biloba 40 MG TABS Take 60 mg by mouth daily.     . isosorbide mononitrate (IMDUR) 30 MG 24 hr tablet Take 1 tablet (30 mg total) by mouth daily. 30 tablet 11  . lisinopril (PRINIVIL,ZESTRIL) 20 MG tablet TAKE ONE TABLET BY MOUTH ONCE DAILY. 90 tablet 1  . metoprolol tartrate (LOPRESSOR) 25 MG tablet Take 0.5 tablets (12.5 mg total) by mouth 2 (two) times daily. 35 tablet 11  . Multiple Vitamin (MULTIVITAMIN) capsule Take 1 capsule by mouth daily.      . nitroGLYCERIN (NITROSTAT) 0.4 MG SL tablet Place 1 tablet (0.4 mg total) under the tongue every 5 (five) minutes as needed for chest pain. 25 tablet 3  . pantoprazole (PROTONIX) 40 MG tablet Take 1 tablet (40 mg total) by mouth daily. 30 tablet 11  . polycarbophil (FIBERCON) 625 MG tablet Take 625 mg by mouth daily.    . ticagrelor (BRILINTA) 90 MG TABS tablet Take 1 tablet (90 mg total) by mouth 2 (two) times daily. 60 tablet 11  . vitamin C (ASCORBIC ACID) 500 MG tablet Take 500 mg by mouth daily.     No current facility-administered medications for this visit.     Past Medical History  Diagnosis Date  . Hypertension    . Coronary artery disease 10/2014    a. cath 10/23/2014 2.5 x 20 Synergy DES to the LAD, EF 40-45 percent   b. relook cath 6/29, patent stents, medical management  . GERD (gastroesophageal reflux disease)   . Hypercholesterolemia   . NSTEMI (non-ST elevated myocardial infarction) 10/20/2014    2.5 x 20 Synergy DES to the LAD, EF 40-45 percent  . Kidney stones     "passed it"  . Acute kidney injury 10/23/2014    "related to the MI"    Past Surgical History  Procedure Laterality Date  . Tonsillectomy    . Tympanoplasty Left 1960's    "perforated eardrum"  . Coronary angioplasty with stent placement  10/23/2014    2.5 x 20 Synergy DES to the LAD  . Cardiac catheterization N/A 10/23/2014    Procedure: Left Heart Cath and Coronary Angiography;  Surgeon: Corky Crafts, MD; LAD 95%, thrombotic, small ramus intermedius, CFX and OM branch is patent, RCA 20%     . Cardiac catheterization N/A 10/23/2014    Procedure: Coronary Stent Intervention;  Surgeon: Corky Crafts, MD; 2.5 x 20 Synergy DES to the LAD    . Cardiac catheterization  11/01/2014  . Cardiac catheterization N/A 11/01/2014    Procedure: Left Heart Cath and  Coronary Angiography;  Surgeon: Lennette Bihari, MD;  Location: [facility] INVASIVE CV LAB;  Service: Cardiovascular;  Laterality: N/A;    History   Social History  . Marital Status: Married    Spouse Name: [name]   . Number of Children: 2  . Years of Education: N/A   Occupational History  . Museum/gallery exhibitions officer    Social History Main Topics  . Smoking status: Never Smoker   . Smokeless tobacco: Never Used  . Alcohol Use: Yes     Comment: 11/01/2014 "maybe a beer q 2 wks"  . Drug Use: No  . Sexual Activity:    Partners: Female   Other Topics Concern  . Not on file   Social History Narrative    ROS: no fevers or chills, productive cough, hemoptysis, dysphasia, odynophagia, melena, hematochezia, dysuria, hematuria, rash, seizure activity, orthopnea, PND,  pedal edema, claudication. Remaining systems are negative.  Physical Exam: Well-developed well-nourished in no acute distress.  Skin is warm and dry.  HEENT is normal.  Neck is supple.  Chest is clear to auscultation with normal expansion.  Cardiovascular exam is regular rate and rhythm.  Abdominal exam nontender or distended. No masses palpated. Extremities show no edema. neuro grossly intact

## 2014-12-04 ENCOUNTER — Ambulatory Visit (INDEPENDENT_AMBULATORY_CARE_PROVIDER_SITE_OTHER): Payer: BLUE CROSS/BLUE SHIELD | Admitting: Cardiology

## 2014-12-04 ENCOUNTER — Encounter: Payer: Self-pay | Admitting: *Deleted

## 2014-12-04 ENCOUNTER — Encounter: Payer: Self-pay | Admitting: Cardiology

## 2014-12-04 VITALS — BP 126/72 | HR 99 | Ht 69.0 in | Wt 184.0 lb

## 2014-12-04 DIAGNOSIS — E785 Hyperlipidemia, unspecified: Secondary | ICD-10-CM | POA: Diagnosis not present

## 2014-12-04 DIAGNOSIS — I255 Ischemic cardiomyopathy: Secondary | ICD-10-CM | POA: Diagnosis not present

## 2014-12-04 DIAGNOSIS — I25118 Atherosclerotic heart disease of native coronary artery with other forms of angina pectoris: Secondary | ICD-10-CM | POA: Diagnosis not present

## 2014-12-04 DIAGNOSIS — I2510 Atherosclerotic heart disease of native coronary artery without angina pectoris: Secondary | ICD-10-CM | POA: Diagnosis not present

## 2014-12-04 DIAGNOSIS — I2583 Coronary atherosclerosis due to lipid rich plaque: Secondary | ICD-10-CM

## 2014-12-04 NOTE — Assessment & Plan Note (Signed)
Continue aspirin, brilinta and statin. 

## 2014-12-04 NOTE — Assessment & Plan Note (Signed)
Continue beta blocker and ACE inhibitor. 

## 2014-12-04 NOTE — Assessment & Plan Note (Signed)
Blood pressure controlled. Continue present medications. 

## 2014-12-04 NOTE — Assessment & Plan Note (Signed)
Continue statin. 

## 2014-12-04 NOTE — Assessment & Plan Note (Signed)
Continue aspirin and statin. 

## 2014-12-04 NOTE — Patient Instructions (Signed)
Your physician wants you to follow-up in: 6 MONTHS WITH DR Jens Som IN [place] You will receive a reminder letter in the mail two months in advance. If you don't receive a letter, please call our office to schedule the follow-up appointment.   Your physician recommends that you return for lab work PRIOR TO EATING

## 2014-12-05 LAB — HEPATIC FUNCTION PANEL
ALK PHOS: 72 U/L (ref 40–115)
ALT: 36 U/L (ref 9–46)
AST: 25 U/L (ref 10–35)
Albumin: 4.2 g/dL (ref 3.6–5.1)
BILIRUBIN TOTAL: 0.6 mg/dL (ref 0.2–1.2)
Bilirubin, Direct: 0.2 mg/dL (ref <=0.2)
Indirect Bilirubin: 0.4 mg/dL (ref 0.2–1.2)
TOTAL PROTEIN: 6.3 g/dL (ref 6.1–8.1)

## 2014-12-05 LAB — LIPID PANEL
Cholesterol: 94 mg/dL — ABNORMAL LOW (ref 125–200)
HDL: 47 mg/dL (ref >=40)
LDL CALC: 34 mg/dL (ref <130)
Total CHOL/HDL Ratio: 2 Ratio (ref <=5.0)
Triglycerides: 63 mg/dL (ref <150)
VLDL: 13 mg/dL (ref <30)

## 2014-12-07 ENCOUNTER — Encounter: Payer: Self-pay | Admitting: *Deleted

## 2015-03-02 ENCOUNTER — Other Ambulatory Visit: Payer: Self-pay | Admitting: Family Medicine

## 2015-03-28 ENCOUNTER — Other Ambulatory Visit: Payer: Self-pay | Admitting: Family Medicine

## 2015-05-01 ENCOUNTER — Other Ambulatory Visit: Payer: Self-pay | Admitting: Family Medicine

## 2015-05-03 ENCOUNTER — Other Ambulatory Visit: Payer: Self-pay | Admitting: Family Medicine

## 2015-05-03 MED ORDER — LISINOPRIL 20 MG PO TABS: 20.0000 mg | ORAL_TABLET | Freq: Every day | ORAL | Status: DC

## 2015-05-03 NOTE — Telephone Encounter (Signed)
Pt wife called, states Pt needs a refill on his Lisinopril Rx. Advised Pt needs an appt to continue getting refills. Wife states he just started a new job and cant take time off, schedule for a late afternoon appt to accommodate his work scheulde. Stressed importance of keeping scheduled appt.

## 2015-05-18 ENCOUNTER — Ambulatory Visit (INDEPENDENT_AMBULATORY_CARE_PROVIDER_SITE_OTHER): Payer: BLUE CROSS/BLUE SHIELD | Admitting: Family Medicine

## 2015-05-18 ENCOUNTER — Encounter: Payer: Self-pay | Admitting: Family Medicine

## 2015-05-18 VITALS — BP 125/79 | HR 79 | Wt 186.0 lb

## 2015-05-18 DIAGNOSIS — K2100 Gastro-esophageal reflux disease with esophagitis, without bleeding: Secondary | ICD-10-CM

## 2015-05-18 DIAGNOSIS — I10 Essential (primary) hypertension: Secondary | ICD-10-CM

## 2015-05-18 DIAGNOSIS — I420 Dilated cardiomyopathy: Secondary | ICD-10-CM

## 2015-05-18 DIAGNOSIS — I2583 Coronary atherosclerosis due to lipid rich plaque: Secondary | ICD-10-CM

## 2015-05-18 DIAGNOSIS — I2510 Atherosclerotic heart disease of native coronary artery without angina pectoris: Secondary | ICD-10-CM | POA: Diagnosis not present

## 2015-05-18 MED ORDER — LISINOPRIL 20 MG PO TABS: 20.0000 mg | ORAL_TABLET | Freq: Every day | ORAL | Status: DC

## 2015-05-18 MED ORDER — METOPROLOL TARTRATE 25 MG PO TABS: 12.5000 mg | ORAL_TABLET | Freq: Two times a day (BID) | ORAL | Status: DC

## 2015-05-18 MED ORDER — PANTOPRAZOLE SODIUM 40 MG PO TBEC: 40.0000 mg | DELAYED_RELEASE_TABLET | Freq: Every day | ORAL | Status: DC

## 2015-05-18 NOTE — Progress Notes (Signed)
   Subjective:    Patient ID: Lee Taylor, male    DOB: 02/06/1956, 60 y.o.   MRN: [id number]  HPI Hypertension- Pt denies chest pain, SOB, dizziness, or heart palpitations.  Taking meds as directed w/o problems.  Denies medication side effects.    Coronary artery disease-no recent chest pain shortness of breath or palpitations. He is currently taking a statin, aspirin, and beta blocker. He denies any side effects or concerns with his medications. He does need some refills today.  GERD-he has been having a little bit more reflux recently. He takes Zantac twice a day but has been having some occasional breakthrough symptoms. It's mostly when he eats late in the evening. He says he has to go to bed early because he gets up at 4 AM. He does try to avoid trigger foods such as spicy foods and acidic foods.  Review of Systems     Objective:   Physical Exam  Constitutional: He is oriented to person, place, and time. He appears well-developed and well-nourished.  HENT:  Head: Normocephalic and atraumatic.  Cardiovascular: Normal rate, regular rhythm and normal heart sounds.   Pulmonary/Chest: Effort normal and breath sounds normal.  Neurological: He is alert and oriented to person, place, and time.  Skin: Skin is warm and dry.  Psychiatric: He has a normal mood and affect. His behavior is normal.          Assessment & Plan:  HTN - BP at goal.  Continue current regimen. Follow-up in 6 months. He will be due for a BMP next month. He has actually says to follow-up with cardiology in February but says he has not received the letter yet. We will check on that.  CAD- stable. No recent symptoms. He is on beta blocker, aspirin, statin, ACE inhibitor. He is due for follow-up next month with cardiology. He says he has not been contacted yet.  GERD - recommend switching to a PPI for a short period of time. We did discuss long-term potential effects of chronic PPI use. We will switch to a PPI to try  to get his symptoms under control and then he can switch back to the Zantac. Medication refill today. Discussed that if he is having some breakthrough symptoms on the Protonix that he needs to give us a call back if he needs referral to GI for further evaluation. Also manage him about reflux measures   Given flu vaccine.

## 2015-06-05 NOTE — Progress Notes (Signed)
HPI: FU CAD; admitted 6/16 with NSTEMI; had PCI of LAD with DES. Echo 6/16 showed EF 40-45, grade 1 diastolic dysfunction. Admitted with recurrent CP and repeat cath 11/01/14 showed 20 LAD, distal 70 LAD, 20 RCA, normal LV function; medical therapy recommended. Readmitted 7/16 with CP and ruled out. Since last seen, the patient denies any dyspnea on exertion, orthopnea, PND, pedal edema, palpitations, syncope or chest pain.   Current Outpatient Prescriptions  Medication Sig Dispense Refill  . aspirin 81 MG tablet Take 81 mg by mouth daily.      [name] atorvastatin (LIPITOR) 80 MG tablet Take 1 tablet (80 mg total) by mouth daily at 6 PM. 30 tablet 11  . Calcium Carb-Cholecalciferol 600-800 MG-UNIT TABS Take 1 tablet by mouth daily.     . Ginkgo Biloba 40 MG TABS Take 60 mg by mouth daily.     . isosorbide mononitrate (IMDUR) 30 MG 24 hr tablet Take 1 tablet (30 mg total) by mouth daily. 30 tablet 11  . lisinopril (PRINIVIL,ZESTRIL) 20 MG tablet Take 1 tablet (20 mg total) by mouth daily. 90 tablet 1  . metoprolol tartrate (LOPRESSOR) 25 MG tablet Take 0.5 tablets (12.5 mg total) by mouth 2 (two) times daily. 90 tablet 1  . Multiple Vitamin (MULTIVITAMIN) capsule Take 1 capsule by mouth daily.      . nitroGLYCERIN (NITROSTAT) 0.4 MG SL tablet Place 1 tablet (0.4 mg total) under the tongue every 5 (five) minutes as needed for chest pain. 25 tablet 3  . pantoprazole (PROTONIX) 40 MG tablet Take 1 tablet (40 mg total) by mouth daily. 30 tablet 11  . polycarbophil (FIBERCON) 625 MG tablet Take 625 mg by mouth daily.    . ticagrelor (BRILINTA) 90 MG TABS tablet Take 1 tablet (90 mg total) by mouth 2 (two) times daily. 60 tablet 11  . vitamin C (ASCORBIC ACID) 500 MG tablet Take 500 mg by mouth daily.     No current facility-administered medications for this visit.     Past Medical History  Diagnosis Date  . Hypertension   . Coronary artery disease 10/2014    a. cath 10/23/2014 2.5 x 20  Synergy DES to the LAD, EF 40-45 percent   b. relook cath 6/29, patent stents, medical management  . GERD (gastroesophageal reflux disease)   . Hypercholesterolemia   . NSTEMI (non-ST elevated myocardial infarction) (HCC) 10/20/2014    2.5 x 20 Synergy DES to the LAD, EF 40-45 percent  . Kidney stones     "passed it"  . Acute kidney injury (HCC) 10/23/2014    "related to the MI"    Past Surgical History  Procedure Laterality Date  . Tonsillectomy    . Tympanoplasty Left 1960's    "perforated eardrum"  . Coronary angioplasty with stent placement  10/23/2014    2.5 x 20 Synergy DES to the LAD  . Cardiac catheterization N/A 10/23/2014    Procedure: Left Heart Cath and Coronary Angiography;  Surgeon: Corky Crafts, MD; LAD 95%, thrombotic, small ramus intermedius, CFX and OM branch is patent, RCA 20%     . Cardiac catheterization N/A 10/23/2014    Procedure: Coronary Stent Intervention;  Surgeon: Corky Crafts, MD; 2.5 x 20 Synergy DES to the LAD    . Cardiac catheterization  11/01/2014  . Cardiac catheterization N/A 11/01/2014    Procedure: Left Heart Cath and Coronary Angiography;  Surgeon: Lennette Bihari, MD;  Location: [facility] INVASIVE CV LAB;  Service: Cardiovascular;  Laterality: N/A;    Social History   Social History  . Marital Status: Married    Spouse Name: [name]   . Number of Children: 2  . Years of Education: N/A   Occupational History  . Museum/gallery exhibitions officer    Social History Main Topics  . Smoking status: Never Smoker   . Smokeless tobacco: Never Used  . Alcohol Use: Yes     Comment: 11/01/2014 "maybe a beer q 2 wks"  . Drug Use: No  . Sexual Activity:    Partners: Female   Other Topics Concern  . Not on file   Social History Narrative    Family History  Problem Relation Age of Onset  . Heart failure Mother   . Heart attack Sister 48    smoker    . Heart disease Mother     triple bypass   . Stroke Father     ROS: no fevers or chills,  productive cough, hemoptysis, dysphasia, odynophagia, melena, hematochezia, dysuria, hematuria, rash, seizure activity, orthopnea, PND, pedal edema, claudication. Remaining systems are negative.  Physical Exam: Well-developed well-nourished in no acute distress.  Skin is warm and dry.  HEENT is normal.  Neck is supple.  Chest is clear to auscultation with normal expansion.  Cardiovascular exam is regular rate and rhythm.  Abdominal exam nontender or distended. No masses palpated. Extremities show no edema. neuro grossly intact  ECG Sinus rhythm at a rate of 80.Normal axis. No ST changes.

## 2015-06-06 ENCOUNTER — Encounter: Payer: Self-pay | Admitting: Cardiology

## 2015-06-06 ENCOUNTER — Ambulatory Visit (INDEPENDENT_AMBULATORY_CARE_PROVIDER_SITE_OTHER): Payer: BLUE CROSS/BLUE SHIELD | Admitting: Cardiology

## 2015-06-06 VITALS — BP 124/84 | HR 80 | Ht 69.0 in | Wt 179.8 lb

## 2015-06-06 DIAGNOSIS — E785 Hyperlipidemia, unspecified: Secondary | ICD-10-CM

## 2015-06-06 DIAGNOSIS — I10 Essential (primary) hypertension: Secondary | ICD-10-CM

## 2015-06-06 DIAGNOSIS — I255 Ischemic cardiomyopathy: Secondary | ICD-10-CM

## 2015-06-06 DIAGNOSIS — I2510 Atherosclerotic heart disease of native coronary artery without angina pectoris: Secondary | ICD-10-CM

## 2015-06-06 NOTE — Assessment & Plan Note (Signed)
Continue statin. 

## 2015-06-06 NOTE — Assessment & Plan Note (Signed)
Continue aspirin, brilinta and statin. 

## 2015-06-06 NOTE — Assessment & Plan Note (Signed)
Blood pressure controlled. Continue present medications. 

## 2015-06-06 NOTE — Assessment & Plan Note (Signed)
Continue ACE inhibitor and beta blocker. 

## 2015-06-06 NOTE — Patient Instructions (Signed)
Your physician wants you to follow-up in: 6 MONTHS WITH DR CRENSHAW You will receive a reminder letter in the mail two months in advance. If you don't receive a letter, please call our office to schedule the follow-up appointment.  

## 2015-07-05 ENCOUNTER — Inpatient Hospital Stay (HOSPITAL_COMMUNITY)
Admission: EM | Admit: 2015-07-05 | Discharge: 2015-07-10 | DRG: 378 | Disposition: A | Discharge status: Home / Self Care | Payer: BLUE CROSS/BLUE SHIELD | Attending: Internal Medicine | Admitting: Internal Medicine

## 2015-07-05 ENCOUNTER — Ambulatory Visit (INDEPENDENT_AMBULATORY_CARE_PROVIDER_SITE_OTHER): Payer: BLUE CROSS/BLUE SHIELD | Admitting: Family Medicine

## 2015-07-05 ENCOUNTER — Encounter (HOSPITAL_COMMUNITY): Payer: Self-pay | Admitting: Emergency Medicine

## 2015-07-05 ENCOUNTER — Encounter: Payer: Self-pay | Admitting: Family Medicine

## 2015-07-05 VITALS — BP 89/52 | HR 77 | Wt 174.0 lb

## 2015-07-05 DIAGNOSIS — K219 Gastro-esophageal reflux disease without esophagitis: Secondary | ICD-10-CM | POA: Diagnosis present

## 2015-07-05 DIAGNOSIS — D62 Acute posthemorrhagic anemia: Secondary | ICD-10-CM

## 2015-07-05 DIAGNOSIS — Z823 Family history of stroke: Secondary | ICD-10-CM

## 2015-07-05 DIAGNOSIS — D649 Anemia, unspecified: Secondary | ICD-10-CM

## 2015-07-05 DIAGNOSIS — Z7982 Long term (current) use of aspirin: Secondary | ICD-10-CM

## 2015-07-05 DIAGNOSIS — Z955 Presence of coronary angioplasty implant and graft: Secondary | ICD-10-CM

## 2015-07-05 DIAGNOSIS — K2100 Gastro-esophageal reflux disease with esophagitis, without bleeding: Secondary | ICD-10-CM

## 2015-07-05 DIAGNOSIS — K922 Gastrointestinal hemorrhage, unspecified: Secondary | ICD-10-CM

## 2015-07-05 DIAGNOSIS — I951 Orthostatic hypotension: Secondary | ICD-10-CM | POA: Diagnosis not present

## 2015-07-05 DIAGNOSIS — K921 Melena: Secondary | ICD-10-CM | POA: Diagnosis not present

## 2015-07-05 DIAGNOSIS — Z7902 Long term (current) use of antithrombotics/antiplatelets: Secondary | ICD-10-CM

## 2015-07-05 DIAGNOSIS — I10 Essential (primary) hypertension: Secondary | ICD-10-CM | POA: Diagnosis present

## 2015-07-05 DIAGNOSIS — Z79899 Other long term (current) drug therapy: Secondary | ICD-10-CM

## 2015-07-05 DIAGNOSIS — I252 Old myocardial infarction: Secondary | ICD-10-CM

## 2015-07-05 DIAGNOSIS — Z8249 Family history of ischemic heart disease and other diseases of the circulatory system: Secondary | ICD-10-CM

## 2015-07-05 DIAGNOSIS — Z86010 Personal history of colonic polyps: Secondary | ICD-10-CM

## 2015-07-05 DIAGNOSIS — R1013 Epigastric pain: Secondary | ICD-10-CM | POA: Diagnosis present

## 2015-07-05 DIAGNOSIS — Z87442 Personal history of urinary calculi: Secondary | ICD-10-CM

## 2015-07-05 DIAGNOSIS — N179 Acute kidney failure, unspecified: Secondary | ICD-10-CM | POA: Diagnosis present

## 2015-07-05 DIAGNOSIS — E785 Hyperlipidemia, unspecified: Secondary | ICD-10-CM | POA: Diagnosis present

## 2015-07-05 DIAGNOSIS — I959 Hypotension, unspecified: Secondary | ICD-10-CM | POA: Diagnosis present

## 2015-07-05 DIAGNOSIS — I2510 Atherosclerotic heart disease of native coronary artery without angina pectoris: Secondary | ICD-10-CM | POA: Diagnosis present

## 2015-07-05 LAB — COMPREHENSIVE METABOLIC PANEL
ALK PHOS: 57 U/L (ref 38–126)
ALT: 36 U/L (ref 17–63)
AST: 36 U/L (ref 15–41)
Albumin: 3.9 g/dL (ref 3.5–5.0)
Anion gap: 10 (ref 5–15)
BILIRUBIN TOTAL: 0.6 mg/dL (ref 0.3–1.2)
BUN: 64 mg/dL — ABNORMAL HIGH (ref 6–20)
CALCIUM: 9.5 mg/dL (ref 8.9–10.3)
CO2: 23 mmol/L (ref 22–32)
CREATININE: 1.99 mg/dL — AB (ref 0.61–1.24)
Chloride: 106 mmol/L (ref 101–111)
GFR calc non Af Amer: 35 mL/min — ABNORMAL LOW (ref >60)
GFR, EST AFRICAN AMERICAN: 41 mL/min — AB (ref >60)
GLUCOSE: 113 mg/dL — AB (ref 65–99)
Potassium: 4.5 mmol/L (ref 3.5–5.1)
Sodium: 139 mmol/L (ref 135–145)
TOTAL PROTEIN: 5.9 g/dL — AB (ref 6.5–8.1)

## 2015-07-05 LAB — CBC
HCT: 33 % — ABNORMAL LOW (ref 39.0–52.0)
HEMATOCRIT: 28.2 % — AB (ref 39.0–52.0)
HEMOGLOBIN: 10.9 g/dL — AB (ref 13.0–17.0)
HEMOGLOBIN: 9.2 g/dL — AB (ref 13.0–17.0)
MCH: 30.5 pg (ref 26.0–34.0)
MCH: 30.4 pg (ref 26.0–34.0)
MCHC: 33 g/dL (ref 30.0–36.0)
MCHC: 32.6 g/dL (ref 30.0–36.0)
MCV: 92.4 fL (ref 78.0–100.0)
MCV: 93.1 fL (ref 78.0–100.0)
PLATELETS: 268 10*3/uL (ref 150–400)
Platelets: 224 10*3/uL (ref 150–400)
RBC: 3.57 MIL/uL — ABNORMAL LOW (ref 4.22–5.81)
RBC: 3.03 MIL/uL — ABNORMAL LOW (ref 4.22–5.81)
RDW: 13.9 % (ref 11.5–15.5)
RDW: 13.8 % (ref 11.5–15.5)
WBC: 9 10*3/uL (ref 4.0–10.5)
WBC: 7.6 10*3/uL (ref 4.0–10.5)

## 2015-07-05 LAB — POC OCCULT BLOOD, ED: Fecal Occult Bld: NEGATIVE

## 2015-07-05 LAB — ABO/RH: ABO/RH(D): A NEG

## 2015-07-05 MED ORDER — SODIUM CHLORIDE 0.9 % IV BOLUS (SEPSIS)
1000.0000 mL | Freq: Once | INTRAVENOUS | Status: AC
  Administered 2015-07-05: 1000 mL via INTRAVENOUS

## 2015-07-05 MED ORDER — PANTOPRAZOLE SODIUM 40 MG IV SOLR
40.0000 mg | Freq: Once | INTRAVENOUS | Status: AC
  Administered 2015-07-05: 40 mg via INTRAVENOUS
  Filled 2015-07-05: qty 40

## 2015-07-05 NOTE — ED Notes (Signed)
Pt states he started having black stools 2 days ago and is now feeling light headed and a little fatigued. Pt states he's had about 12 episodes in the last 48 hours. Pt taking brilnta blood thinner.

## 2015-07-05 NOTE — Progress Notes (Signed)
Lee Taylor is a 60 y.o. male who presents to [facility]: Primary Care today for lightheaded and dizzy. Patient notes a few day history of lightheadedness and dizziness. This coincided with black tarry stools. He denies any palpitations chest pains fevers or chills. He denies any changes in medications. He notes she's been taking Brilinta for a while now. He denies any abdominal pain.   Past Medical History  Diagnosis Date  . Hypertension   . Coronary artery disease 10/2014    a. cath 10/23/2014 2.5 x 20 Synergy DES to the LAD, EF 40-45 percent   b. relook cath 6/29, patent stents, medical management  . GERD (gastroesophageal reflux disease)   . Hypercholesterolemia   . NSTEMI (non-ST elevated myocardial infarction) (HCC) 10/20/2014    2.5 x 20 Synergy DES to the LAD, EF 40-45 percent  . Kidney stones     "passed it"  . Acute kidney injury (HCC) 10/23/2014    "related to the MI"   Past Surgical History  Procedure Laterality Date  . Tonsillectomy    . Tympanoplasty Left 1960's    "perforated eardrum"  . Coronary angioplasty with stent placement  10/23/2014    2.5 x 20 Synergy DES to the LAD  . Cardiac catheterization N/A 10/23/2014    Procedure: Left Heart Cath and Coronary Angiography;  Surgeon: Corky Crafts, MD; LAD 95%, thrombotic, small ramus intermedius, CFX and OM branch is patent, RCA 20%     . Cardiac catheterization N/A 10/23/2014    Procedure: Coronary Stent Intervention;  Surgeon: Corky Crafts, MD; 2.5 x 20 Synergy DES to the LAD    . Cardiac catheterization  11/01/2014  . Cardiac catheterization N/A 11/01/2014    Procedure: Left Heart Cath and Coronary Angiography;  Surgeon: Lennette Bihari, MD;  Location: [facility] INVASIVE CV LAB;  Service: Cardiovascular;  Laterality: N/A;   Social History  Substance Use Topics  . Smoking status: Never Smoker   . Smokeless tobacco: Never  Used  . Alcohol Use: Yes     Comment: 11/01/2014 "maybe a beer q 2 wks"   family history includes Heart attack (age of onset: 5) in his sister; Heart disease in his mother; Heart failure in his mother; Stroke in his father.  ROS as above Medications: Current Outpatient Prescriptions  Medication Sig Dispense Refill  . aspirin 81 MG tablet Take 81 mg by mouth daily.      [name] atorvastatin (LIPITOR) 80 MG tablet Take 1 tablet (80 mg total) by mouth daily at 6 PM. 30 tablet 11  . Calcium Carb-Cholecalciferol 600-800 MG-UNIT TABS Take 1 tablet by mouth daily.     . Ginkgo Biloba 40 MG TABS Take 60 mg by mouth daily.     . isosorbide mononitrate (IMDUR) 30 MG 24 hr tablet Take 1 tablet (30 mg total) by mouth daily. 30 tablet 11  . lisinopril (PRINIVIL,ZESTRIL) 20 MG tablet Take 1 tablet (20 mg total) by mouth daily. 90 tablet 1  . metoprolol tartrate (LOPRESSOR) 25 MG tablet Take 0.5 tablets (12.5 mg total) by mouth 2 (two) times daily. 90 tablet 1  . Multiple Vitamin (MULTIVITAMIN) capsule Take 1 capsule by mouth daily.      . nitroGLYCERIN (NITROSTAT) 0.4 MG SL tablet Place 1 tablet (0.4 mg total) under the tongue every 5 (five) minutes as needed for chest pain. 25 tablet 3  . pantoprazole (PROTONIX) 40 MG tablet Take 1 tablet (40 mg total) by  mouth daily. 30 tablet 11  . polycarbophil (FIBERCON) 625 MG tablet Take 625 mg by mouth daily.    . ticagrelor (BRILINTA) 90 MG TABS tablet Take 1 tablet (90 mg total) by mouth 2 (two) times daily. 60 tablet 11   No current facility-administered medications for this visit.   Allergies  Allergen Reactions  . Amoxicillin      Exam:  BP 89/52 mmHg  Pulse 77  Wt 174 lb (78.926 kg)  Orthostatic VS for the past 24 hrs:  BP- Lying Pulse- Lying BP- Sitting Pulse- Sitting BP- Standing at 0 minutes Pulse- Standing at 0 minutes  07/05/15 1422 (!) 89/48 mmHg 73 (!) 89/52 mmHg 77 (!) 86/49 mmHg 92      Gen: Well NAD nontoxic appearing HEENT: EOMI,   MMM Lungs: Normal work of breathing. CTABL Heart: RRR no MRG Abd: NABS, Soft. Nondistended, Nontender Exts: Brisk capillary refill, warm and well perfused.   Hemoccult stool is positive  Twelve-lead EKG shows normal sinus rhythm at 76 BP per minute. Flat lateral precordial T waves. Unchanged from previous EKGs.  No results found for this or any previous visit (from the past 24 hour(s)). No results found.   60 year old man with orthostatic hypotension likely due to upper GI bleed with melanotic stools and positive Hemoccult. Patient is currently taking antiplatelet agent. He appears well but I'm worried about his ability to continue to compensate. Plan to transfer to the ED for further workup and evaluation.

## 2015-07-05 NOTE — ED Provider Notes (Signed)
CSN: [id number]     Arrival date & time 07/05/15  1805 History   First MD Initiated Contact with Patient 07/05/15 2207     Chief Complaint  Patient presents with  . GI Bleeding   HPI  Lee Taylor is a 60 y.o. male PMH significant for hypertension, coronary artery disease, and STEMI, hyperlipidemia presenting with a 2 day history of black, tarry stools. He states that he has had diarrhea for 1 week. He attributed the diarrhea had 2 stress from starting a new job; however he became worried when he noticed the black stools. He does endorse being on Brilinta. He had a normal colonoscopy 1-2 years ago. He denies fevers, chills, chest pain, shortness of breath, abdominal pain, nausea, vomiting, history of this, blood clots.   Past Medical History  Diagnosis Date  . Hypertension   . Coronary artery disease 10/2014    a. cath 10/23/2014 2.5 x 20 Synergy DES to the LAD, EF 40-45 percent   b. relook cath 6/29, patent stents, medical management  . GERD (gastroesophageal reflux disease)   . Hypercholesterolemia   . NSTEMI (non-ST elevated myocardial infarction) (HCC) 10/20/2014    2.5 x 20 Synergy DES to the LAD, EF 40-45 percent  . Kidney stones     "passed it"  . Acute kidney injury (HCC) 10/23/2014    "related to the MI"   Past Surgical History  Procedure Laterality Date  . Tonsillectomy    . Tympanoplasty Left 1960's    "perforated eardrum"  . Coronary angioplasty with stent placement  10/23/2014    2.5 x 20 Synergy DES to the LAD  . Cardiac catheterization N/A 10/23/2014    Procedure: Left Heart Cath and Coronary Angiography;  Surgeon: Corky Crafts, MD; LAD 95%, thrombotic, small ramus intermedius, CFX and OM branch is patent, RCA 20%     . Cardiac catheterization N/A 10/23/2014    Procedure: Coronary Stent Intervention;  Surgeon: Corky Crafts, MD; 2.5 x 20 Synergy DES to the LAD    . Cardiac catheterization  11/01/2014  . Cardiac catheterization N/A 11/01/2014    Procedure:  Left Heart Cath and Coronary Angiography;  Surgeon: Lennette Bihari, MD;  Location: [facility] INVASIVE CV LAB;  Service: Cardiovascular;  Laterality: N/A;   Family History  Problem Relation Age of Onset  . Heart failure Mother   . Heart attack Sister 67    smoker    . Heart disease Mother     triple bypass   . Stroke Father    Social History  Substance Use Topics  . Smoking status: Never Smoker   . Smokeless tobacco: Never Used  . Alcohol Use: Yes     Comment: 11/01/2014 "maybe a beer q 2 wks"    Review of Systems  Ten systems are reviewed and are negative for acute change except as noted in the HPI  Allergies  Amoxicillin  Home Medications   Prior to Admission medications   Medication Sig Start Date End Date Taking? Authorizing Provider  aspirin 81 MG tablet Take 81 mg by mouth daily.     Yes Historical Provider, MD  atorvastatin (LIPITOR) 80 MG tablet Take 1 tablet (80 mg total) by mouth daily at 6 PM. 10/24/14  Yes Rhonda G Barrett, PA-C  Calcium Carb-Cholecalciferol 600-800 MG-UNIT TABS Take 1 tablet by mouth daily.    Yes Historical Provider, MD  Ginkgo Biloba 40 MG TABS Take 60 mg by mouth daily.    Yes Historical Provider,  MD  isosorbide mononitrate (IMDUR) 30 MG 24 hr tablet Take 1 tablet (30 mg total) by mouth daily. 11/05/14  Yes Dwana Melena, PA-C  lisinopril (PRINIVIL,ZESTRIL) 20 MG tablet Take 1 tablet (20 mg total) by mouth daily. 05/18/15  Yes Agapito Games, MD  metoprolol tartrate (LOPRESSOR) 25 MG tablet Take 0.5 tablets (12.5 mg total) by mouth 2 (two) times daily. 05/18/15  Yes Agapito Games, MD  Multiple Vitamin (MULTIVITAMIN) capsule Take 1 capsule by mouth daily.     Yes Historical Provider, MD  nitroGLYCERIN (NITROSTAT) 0.4 MG SL tablet Place 1 tablet (0.4 mg total) under the tongue every 5 (five) minutes as needed for chest pain. 10/24/14  Yes Rhonda G Barrett, PA-C  pantoprazole (PROTONIX) 40 MG tablet Take 1 tablet (40 mg total) by mouth daily. 05/18/15   Yes Agapito Games, MD  polycarbophil (FIBERCON) 625 MG tablet Take 625 mg by mouth daily.   Yes Historical Provider, MD  ticagrelor (BRILINTA) 90 MG TABS tablet Take 1 tablet (90 mg total) by mouth 2 (two) times daily. 11/20/14  Yes Rhonda G Barrett, PA-C   BP 98/63 mmHg  Pulse 77  Temp(Src) 97.6 F (36.4 C) (Oral)  Resp 17  Ht  (1.753 m)  Wt 78.926 kg  BMI 25.68 kg/m2  SpO2 100% Physical Exam  Constitutional: He appears well-developed and well-nourished. No distress.  HENT:  Head: Normocephalic and atraumatic.  Mouth/Throat: Oropharynx is clear and moist. No oropharyngeal exudate.  Eyes: Conjunctivae are normal. Pupils are equal, round, and reactive to light. Right eye exhibits no discharge. Left eye exhibits no discharge. No scleral icterus.  Neck: No tracheal deviation present.  Cardiovascular: Normal rate, regular rhythm, normal heart sounds and intact distal pulses.  Exam reveals no gallop and no friction rub.   No murmur heard. Pulmonary/Chest: Effort normal and breath sounds normal. No respiratory distress. He has no wheezes. He has no rales. He exhibits no tenderness.  Abdominal: Soft. Bowel sounds are normal. He exhibits no distension and no mass. There is no tenderness. There is no rebound and no guarding.  Genitourinary:  Digital rectal exam: negative without mass, lesions or tenderness. No frank blood on glove. Chaperone present.   Musculoskeletal: He exhibits no edema.  Lymphadenopathy:    He has no cervical adenopathy.  Neurological: He is alert. Coordination normal.  Skin: Skin is warm and dry. No rash noted. He is not diaphoretic. No erythema.  Psychiatric: He has a normal mood and affect. His behavior is normal.  Nursing note and vitals reviewed.   ED Course  Procedures  Labs Review Labs Reviewed  COMPREHENSIVE METABOLIC PANEL - Abnormal; Notable for the following:    Glucose, Bld 113 (*)    BUN 64 (*)    Creatinine, Ser 1.99 (*)    Total  Protein 5.9 (*)    GFR calc non Af Amer 35 (*)    GFR calc Af Amer 41 (*)    All other components within normal limits  CBC - Abnormal; Notable for the following:    RBC 3.57 (*)    Hemoglobin 10.9 (*)    HCT 33.0 (*)    All other components within normal limits  CBC - Abnormal; Notable for the following:    RBC 3.03 (*)    Hemoglobin 9.2 (*)    HCT 28.2 (*)    All other components within normal limits  POC OCCULT BLOOD, ED  TYPE AND SCREEN  ABO/RH   MDM  Final diagnoses:  Gastrointestinal hemorrhage, unspecified gastritis, unspecified gastrointestinal hemorrhage type   Patient nontoxic appearing, VSS. Unremarkable physical exam. Patient with complaints of GI bleed. Hgb upon arrival was 10.9. Upon evaluation, Dr. Criss Alvine advised repeat hgb, which was 9.2. Hemoccult negative. CMP demonstrates elevated SCr at 1.99.  Hospitalist requesting consults to cardiology and GI be placed prior to admission.  Spoke with [facility] on call- advised that because patient has DES and placement was >6 months ago, risk of stopping Brilinta for acute GI bleed is low. Risk vs benefits will need to be discussed with patient. After 48 hours of stable hgb, Brilinta may be restarted.  Paged GI as well but return call was pending at shift change.   Melton Krebs, PA-C 07/08/15 2041  Pricilla Loveless, MD 07/11/15 [phone]

## 2015-07-06 ENCOUNTER — Telehealth: Payer: Self-pay | Admitting: *Deleted

## 2015-07-06 ENCOUNTER — Ambulatory Visit: Payer: BLUE CROSS/BLUE SHIELD | Admitting: Family Medicine

## 2015-07-06 DIAGNOSIS — Z8249 Family history of ischemic heart disease and other diseases of the circulatory system: Secondary | ICD-10-CM | POA: Diagnosis not present

## 2015-07-06 DIAGNOSIS — D649 Anemia, unspecified: Secondary | ICD-10-CM | POA: Diagnosis not present

## 2015-07-06 DIAGNOSIS — K219 Gastro-esophageal reflux disease without esophagitis: Secondary | ICD-10-CM | POA: Diagnosis present

## 2015-07-06 DIAGNOSIS — Z7902 Long term (current) use of antithrombotics/antiplatelets: Secondary | ICD-10-CM | POA: Diagnosis not present

## 2015-07-06 DIAGNOSIS — E785 Hyperlipidemia, unspecified: Secondary | ICD-10-CM | POA: Diagnosis present

## 2015-07-06 DIAGNOSIS — I959 Hypotension, unspecified: Secondary | ICD-10-CM | POA: Diagnosis present

## 2015-07-06 DIAGNOSIS — N179 Acute kidney failure, unspecified: Secondary | ICD-10-CM | POA: Diagnosis present

## 2015-07-06 DIAGNOSIS — Z955 Presence of coronary angioplasty implant and graft: Secondary | ICD-10-CM

## 2015-07-06 DIAGNOSIS — I2510 Atherosclerotic heart disease of native coronary artery without angina pectoris: Secondary | ICD-10-CM

## 2015-07-06 DIAGNOSIS — Z7982 Long term (current) use of aspirin: Secondary | ICD-10-CM | POA: Diagnosis not present

## 2015-07-06 DIAGNOSIS — K921 Melena: Principal | ICD-10-CM

## 2015-07-06 DIAGNOSIS — I10 Essential (primary) hypertension: Secondary | ICD-10-CM | POA: Diagnosis present

## 2015-07-06 DIAGNOSIS — D62 Acute posthemorrhagic anemia: Secondary | ICD-10-CM

## 2015-07-06 DIAGNOSIS — Z79899 Other long term (current) drug therapy: Secondary | ICD-10-CM | POA: Diagnosis not present

## 2015-07-06 DIAGNOSIS — R1013 Epigastric pain: Secondary | ICD-10-CM | POA: Diagnosis present

## 2015-07-06 DIAGNOSIS — K922 Gastrointestinal hemorrhage, unspecified: Secondary | ICD-10-CM | POA: Diagnosis present

## 2015-07-06 DIAGNOSIS — Z86010 Personal history of colonic polyps: Secondary | ICD-10-CM | POA: Diagnosis not present

## 2015-07-06 DIAGNOSIS — Z823 Family history of stroke: Secondary | ICD-10-CM | POA: Diagnosis not present

## 2015-07-06 DIAGNOSIS — I252 Old myocardial infarction: Secondary | ICD-10-CM | POA: Diagnosis not present

## 2015-07-06 DIAGNOSIS — Z87442 Personal history of urinary calculi: Secondary | ICD-10-CM | POA: Diagnosis not present

## 2015-07-06 LAB — BASIC METABOLIC PANEL
Anion gap: 9 (ref 5–15)
BUN: 68 mg/dL — AB (ref 6–20)
CHLORIDE: 111 mmol/L (ref 101–111)
CO2: 22 mmol/L (ref 22–32)
Calcium: 8.5 mg/dL — ABNORMAL LOW (ref 8.9–10.3)
Creatinine, Ser: 1.39 mg/dL — ABNORMAL HIGH (ref 0.61–1.24)
GFR calc Af Amer: >60 mL/min (ref >60)
GFR calc non Af Amer: 54 mL/min — ABNORMAL LOW (ref >60)
Glucose, Bld: 102 mg/dL — ABNORMAL HIGH (ref 65–99)
POTASSIUM: 5.1 mmol/L (ref 3.5–5.1)
SODIUM: 142 mmol/L (ref 135–145)

## 2015-07-06 LAB — CBC
HEMATOCRIT: 28.4 % — AB (ref 39.0–52.0)
HEMOGLOBIN: 9.3 g/dL — AB (ref 13.0–17.0)
MCH: 29.9 pg (ref 26.0–34.0)
MCHC: 32.7 g/dL (ref 30.0–36.0)
MCV: 91.3 fL (ref 78.0–100.0)
Platelets: 188 10*3/uL (ref 150–400)
RBC: 3.11 MIL/uL — AB (ref 4.22–5.81)
RDW: 14.7 % (ref 11.5–15.5)
WBC: 6.4 10*3/uL (ref 4.0–10.5)

## 2015-07-06 LAB — PREPARE RBC (CROSSMATCH)

## 2015-07-06 LAB — HEMOGLOBIN: Hemoglobin: 10.8 g/dL — ABNORMAL LOW (ref 13.0–17.0)

## 2015-07-06 MED ORDER — SODIUM CHLORIDE 0.9 % IV SOLN
8.0000 mg/h | INTRAVENOUS | Status: AC
  Administered 2015-07-06 – 2015-07-07 (×3): 8 mg/h via INTRAVENOUS
  Filled 2015-07-06 (×12): qty 80

## 2015-07-06 MED ORDER — ASPIRIN EC 81 MG PO TBEC
81.0000 mg | DELAYED_RELEASE_TABLET | Freq: Every day | ORAL | Status: DC
  Administered 2015-07-08 – 2015-07-10 (×3): 81 mg via ORAL
  Filled 2015-07-06 (×3): qty 1

## 2015-07-06 MED ORDER — ATORVASTATIN CALCIUM 80 MG PO TABS
80.0000 mg | ORAL_TABLET | Freq: Every day | ORAL | Status: DC
  Administered 2015-07-07 – 2015-07-09 (×3): 80 mg via ORAL
  Filled 2015-07-06 (×3): qty 1

## 2015-07-06 MED ORDER — ACETAMINOPHEN 650 MG RE SUPP: 650.0000 mg | Freq: Four times a day (QID) | RECTAL | Status: DC | PRN

## 2015-07-06 MED ORDER — NITROGLYCERIN 0.4 MG SL SUBL: 0.4000 mg | SUBLINGUAL_TABLET | SUBLINGUAL | Status: DC | PRN

## 2015-07-06 MED ORDER — SODIUM CHLORIDE 0.9 % IV SOLN
INTRAVENOUS | Status: DC
  Administered 2015-07-06: 20:00:00 via INTRAVENOUS

## 2015-07-06 MED ORDER — SODIUM CHLORIDE 0.9 % IV BOLUS (SEPSIS)
1000.0000 mL | Freq: Once | INTRAVENOUS | Status: AC
  Administered 2015-07-06: 1000 mL via INTRAVENOUS

## 2015-07-06 MED ORDER — PANTOPRAZOLE SODIUM 40 MG IV SOLR
40.0000 mg | Freq: Two times a day (BID) | INTRAVENOUS | Status: DC
  Administered 2015-07-09 – 2015-07-10 (×3): 40 mg via INTRAVENOUS
  Filled 2015-07-06 (×3): qty 40

## 2015-07-06 MED ORDER — SODIUM CHLORIDE 0.9 % IV SOLN
Freq: Once | INTRAVENOUS | Status: AC
  Administered 2015-07-06: 03:00:00 via INTRAVENOUS

## 2015-07-06 MED ORDER — PANTOPRAZOLE SODIUM 40 MG IV SOLR
40.0000 mg | Freq: Once | INTRAVENOUS | Status: AC
  Administered 2015-07-06: 40 mg via INTRAVENOUS
  Filled 2015-07-06: qty 40

## 2015-07-06 MED ORDER — SODIUM CHLORIDE 0.9 % IV SOLN
INTRAVENOUS | Status: DC
Start: 2015-07-06 — End: 2015-07-10
  Administered 2015-07-06 – 2015-07-07 (×2): 75 mL/h via INTRAVENOUS
  Administered 2015-07-08 – 2015-07-10 (×2): via INTRAVENOUS

## 2015-07-06 MED ORDER — DIPHENHYDRAMINE HCL 25 MG PO CAPS
25.0000 mg | ORAL_CAPSULE | Freq: Once | ORAL | Status: AC
  Administered 2015-07-06: 25 mg via ORAL
  Filled 2015-07-06: qty 1

## 2015-07-06 MED ORDER — SODIUM CHLORIDE 0.9% FLUSH
3.0000 mL | Freq: Two times a day (BID) | INTRAVENOUS | Status: DC
Start: 2015-07-06 — End: 2015-07-10
  Administered 2015-07-07 – 2015-07-10 (×4): 3 mL via INTRAVENOUS

## 2015-07-06 MED ORDER — ACETAMINOPHEN 325 MG PO TABS: 650.0000 mg | ORAL_TABLET | Freq: Four times a day (QID) | ORAL | Status: DC | PRN

## 2015-07-06 MED ORDER — ACETAMINOPHEN 325 MG PO TABS
650.0000 mg | ORAL_TABLET | Freq: Once | ORAL | Status: AC
  Administered 2015-07-06: 650 mg via ORAL
  Filled 2015-07-06: qty 2

## 2015-07-06 NOTE — ED Notes (Signed)
Attempted to call report

## 2015-07-06 NOTE — Telephone Encounter (Signed)
Pricilla Manaois ( patient's current emergency contact; looked on release of info but most current was from 2013 but the contact was the same as emergency contact and the caller) called and wanted to know who the doctor was that the patient saw in 2015 for a colonoscopy. She states the patient is in the hospital today and is not doing well. She states the patient had a colonoscopy in 2015 and could not remember where he went or who the doctor was. I did look and let her know that it looked like Dr. Zachery Dakinsharles Katopes at [facility] performed colonoscopy.

## 2015-07-06 NOTE — H&P (Addendum)
[facility] History and Physical  Lee Taylor UEA:[id number] DOB: 10/16/1955 DOA: 07/05/2015  PCP: Nani Gasser, MD  Specialists: Dr. Olga Millers, Cardiology  Chief Complaint: Melena, near syncope  HPI: Lee Taylor is a 60 y.o. gentleman with a history of CAD, NSTEMI last summer, S/P DES to LAD (on aspirin and Brilinta), HTN, and hypercholesterolemia who developed melena 2-3 days ago.  He did not think much of the dark-colored stools until he became light-headed, dizzy, with vision disturbance and near syncope today.  No loss of consciousness.  No chest pain or shortness of breath.  No nausea or vomiting.  No abdominal pain.  No bright red blood per rectum.  No prior history of GI bleed.  Review of Systems: 12 systems reviewed and negative except as stated in HPI.  Past Medical History  Diagnosis Date  . Hypertension   . Coronary artery disease 10/2014    a. cath 10/23/2014 2.5 x 20 Synergy DES to the LAD, EF 40-45 percent   b. relook cath 6/29, patent stents, medical management  . GERD (gastroesophageal reflux disease)   . Hypercholesterolemia   . NSTEMI (non-ST elevated myocardial infarction) (HCC) 10/20/2014    2.5 x 20 Synergy DES to the LAD, EF 40-45 percent  . Kidney stones     "passed it"  . Acute kidney injury (HCC) 10/23/2014    "related to the MI"   Past Surgical History  Procedure Laterality Date  . Tonsillectomy    . Tympanoplasty Left 1960's    "perforated eardrum"  . Coronary angioplasty with stent placement  10/23/2014    2.5 x 20 Synergy DES to the LAD  . Cardiac catheterization N/A 10/23/2014    Procedure: Left Heart Cath and Coronary Angiography;  Surgeon: Corky Crafts, MD; LAD 95%, thrombotic, small ramus intermedius, CFX and OM branch is patent, RCA 20%     . Cardiac catheterization N/A 10/23/2014    Procedure: Coronary Stent Intervention;  Surgeon: Corky Crafts, MD; 2.5 x 20 Synergy DES to the LAD    . Cardiac catheterization   11/01/2014  . Cardiac catheterization N/A 11/01/2014    Procedure: Left Heart Cath and Coronary Angiography;  Surgeon: Lennette Bihari, MD;  Location: [facility] INVASIVE CV LAB;  Service: Cardiovascular;  Laterality: N/A;   Social History:  Social History   Social History Narrative  No tobacco, EtOH, or illicit drug use.  He is engaged.  Allergies  Allergen Reactions  . Amoxicillin Hives    Family History  Problem Relation Age of Onset  . Heart failure Mother   . Heart attack Sister 36    smoker    . Heart disease Mother     triple bypass   . Stroke Father    Prior to Admission medications   Medication Sig Start Date End Date Taking? Authorizing Provider  aspirin 81 MG tablet Take 81 mg by mouth daily.     Yes Historical Provider, MD  atorvastatin (LIPITOR) 80 MG tablet Take 1 tablet (80 mg total) by mouth daily at 6 PM. 10/24/14  Yes Rhonda G Barrett, PA-C  Calcium Carb-Cholecalciferol 600-800 MG-UNIT TABS Take 1 tablet by mouth daily.    Yes Historical Provider, MD  Ginkgo Biloba 40 MG TABS Take 60 mg by mouth daily.    Yes Historical Provider, MD  isosorbide mononitrate (IMDUR) 30 MG 24 hr tablet Take 1 tablet (30 mg total) by mouth daily. 11/05/14  Yes Dwana Melena, PA-C  lisinopril (PRINIVIL,ZESTRIL) 20 MG tablet Take  1 tablet (20 mg total) by mouth daily. 05/18/15  Yes Agapito Gamesatherine D Metheney, MD  metoprolol tartrate (LOPRESSOR) 25 MG tablet Take 0.5 tablets (12.5 mg total) by mouth 2 (two) times daily. 05/18/15  Yes Agapito Gamesatherine D Metheney, MD  Multiple Vitamin (MULTIVITAMIN) capsule Take 1 capsule by mouth daily.     Yes Historical Provider, MD  nitroGLYCERIN (NITROSTAT) 0.4 MG SL tablet Place 1 tablet (0.4 mg total) under the tongue every 5 (five) minutes as needed for chest pain. 10/24/14  Yes Rhonda G Barrett, PA-C  pantoprazole (PROTONIX) 40 MG tablet Take 1 tablet (40 mg total) by mouth daily. 05/18/15  Yes Agapito Gamesatherine D Metheney, MD  polycarbophil (FIBERCON) 625 MG tablet Take 625 mg by  mouth daily.   Yes Historical Provider, MD  ticagrelor (BRILINTA) 90 MG TABS tablet Take 1 tablet (90 mg total) by mouth 2 (two) times daily. 11/20/14  Yes Darrol Jumphonda G Barrett, PA-C   Physical Exam: Filed Vitals:   07/06/15 0015 07/06/15 0030 07/06/15 0100 07/06/15 0237  BP: 95/61 92/56 97/62  101/57  Pulse: 77 77 80 80  Temp:    98 F (36.7 C)  TempSrc:    Oral  Resp: 15 13 22 16   Height:      Weight:      SpO2: 100% 100% 100% 100%    General:  Awake and alert.  Oriented to person, place, time and situation.  NAD. Eyes: PERRL bilaterally, conjunctiva are pink.  EOMI. ENT: Mucous membranes are dry.  No nasal drainage. Neck: Supple.   Cardiovascular: NR/RR.  No LE edema. Respiratory: CTA bilaterally. Abdomen: Soft/NT/ND.  Bowel sounds are present.  No guarding. Skin: Warm and dry. Musculoskeletal: Moves all four extremities spontaneously. Psychiatric: Normal affect. Neurologic: No focal deficits.  Labs on Admission:  Basic Metabolic Panel:  Recent Labs Lab 07/05/15 1823  NA 139  K 4.5  CL 106  CO2 23  GLUCOSE 113*  BUN 64*  CREATININE 1.99*  CALCIUM 9.5   Liver Function Tests:  Recent Labs Lab 07/05/15 1823  AST 36  ALT 36  ALKPHOS 57  BILITOT 0.6  PROT 5.9*  ALBUMIN 3.9   CBC:  Recent Labs Lab 07/05/15 1823 07/05/15 2320  WBC 9.0 7.6  HGB 10.9* 9.2*  HCT 33.0* 28.2*  MCV 92.4 93.1  PLT 268 224    Assessment/Plan Principal Problem:   GI bleed Active Problems:   CAD (coronary artery disease), native coronary artery   Acute blood loss anemia   Symptomatic anemia   Melena   S/P drug eluting coronary stent placement  Admit to stepdown unit due to relative hypotension and symptomatic anemia in a patient with CAD and recent stent.  Symptomatic anemia secondary to melena --Transfuse 2 units of PRBCs now --Post transfusion H/H --Continue aspirin, hold Brilinta due to acute bleeding with hemodynamically instability, unless cardiology recommends  otherwise --GI consult placed in the ED; NPO, anticipate upper endscopy in the morning --Protonix IV 80mg  bolus (total) then infusion at 8mg /hr  CAD with DES to LAD --Holding antihypertensives due to relative hypotension --Continue statin --Continue aspirin, holding Brilinta as noted above --Cardiology consult placed in the ED  Code Status: FULL Family Communication: Patient alone Disposition Plan: Expect he will be here at least two days.  Time spent: 55 minutes  [employer]ellation BrandsCarter,Jerrald Doverspike Harrison [facility]  07/06/2015, 2:56 AM       AKI noted.  Creatinine approximate 2.  Normal at baseline.  Suspect he is intravascularly deplete secondary to acute blood  loss.  Expect improvement with volume resuscitation.  Repeat BMP in the AM.

## 2015-07-06 NOTE — Consult Note (Signed)
[facility] CONSULT Reason for consult: G.I. bleeding Referring Physician: [facility]. PCP: Dr Beatrice Lecher. 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  Lee Taylor is an 60 y.o. male.  HPI: he has a history of CAD and NSTEMI last year has been on aspirin and Brilinta. He has had chronic dyspepsia had previously was on Zantac. He was having breakthrough symptoms and took one month Protonix with improvement and then switch back to Zantac 150 BID about 2 weeks ago. He felt lightheaded work. He had a new job and has been under a lot of stress with his new job. He had been having dark colored stools for a couple days. He denies any increase in his dyspepsia. He presented to the emergency room with the symptoms. Initial hemoglobin 10.9 dropping to 9.3 while in the emergency room. One dark stool that was actually noted to be hemoccult negative. Patient has had colonoscopy done in [facility] 5/15 with diverticulosis; polyp removed he was told that he needed to have the procedure repeated in 5 years.  Past Medical History  Diagnosis Date  . Hypertension   . Coronary artery disease 10/2014    a. cath 10/23/2014 2.5 x 20 Synergy DES to the LAD, EF 40-45 percent   b. relook cath 6/29, patent stents, medical management  . GERD (gastroesophageal reflux disease)   . Hypercholesterolemia   . NSTEMI (non-ST elevated myocardial infarction) ([place]) 10/20/2014    2.5 x 20 Synergy DES to the LAD, EF 40-45 percent  . Kidney stones     "passed it"  . Acute kidney injury ([place]) 10/23/2014    "related to the MI"    Past Surgical History  Procedure Laterality Date  . Tonsillectomy    . Tympanoplasty Left 1960's    "perforated eardrum"  . Coronary angioplasty with stent placement  10/23/2014    2.5 x 20 Synergy DES to the LAD  . Cardiac catheterization N/A 10/23/2014    Procedure: Left Heart Cath and Coronary Angiography;  Surgeon: Jettie Booze, MD; LAD 95%,  thrombotic, small ramus intermedius, CFX and OM branch is patent, RCA 20%     . Cardiac catheterization N/A 10/23/2014    Procedure: Coronary Stent Intervention;  Surgeon: Jettie Booze, MD; 2.5 x 20 Synergy DES to the LAD    . Cardiac catheterization  11/01/2014  . Cardiac catheterization N/A 11/01/2014    Procedure: Left Heart Cath and Coronary Angiography;  Surgeon: Troy Sine, MD;  Location: [facility];  Service: Cardiovascular;  Laterality: N/A;    Family History  Problem Relation Age of Onset  . Heart failure Mother   . Heart attack Sister 48    smoker    . Heart disease Mother     triple bypass   . Stroke Father     Social History:  reports that he has never smoked. He has never used smokeless tobacco. He reports that he drinks alcohol. He reports that he does not use illicit drugs.  Allergies:  Allergies  Allergen Reactions  . Amoxicillin Hives    Medications; Prior to Admission medications   Medication Sig Start Date End Date Taking? Authorizing Provider  aspirin 81 MG tablet Take 81 mg by mouth daily.     Yes Historical Provider, MD  atorvastatin (LIPITOR) 80 MG tablet Take 1 tablet (80 mg total) by mouth daily at 6 PM. 10/24/14  Yes Rhonda G Barrett, PA-C  Calcium Carb-Cholecalciferol 600-800 MG-UNIT TABS Take 1 tablet by mouth daily.  Yes Historical Provider, MD  Ginkgo Biloba 40 MG TABS Take 60 mg by mouth daily.    Yes Historical Provider, MD  isosorbide mononitrate (IMDUR) 30 MG 24 hr tablet Take 1 tablet (30 mg total) by mouth daily. 11/05/14  Yes Dwana Melena, PA-C  lisinopril (PRINIVIL,ZESTRIL) 20 MG tablet Take 1 tablet (20 mg total) by mouth daily. 05/18/15  Yes Agapito Games, MD  metoprolol tartrate (LOPRESSOR) 25 MG tablet Take 0.5 tablets (12.5 mg total) by mouth 2 (two) times daily. 05/18/15  Yes Agapito Games, MD  Multiple Vitamin (MULTIVITAMIN) capsule Take 1 capsule by mouth daily.     Yes Historical Provider, MD   nitroGLYCERIN (NITROSTAT) 0.4 MG SL tablet Place 1 tablet (0.4 mg total) under the tongue every 5 (five) minutes as needed for chest pain. 10/24/14  Yes Rhonda G Barrett, PA-C  pantoprazole (PROTONIX) 40 MG tablet Take 1 tablet (40 mg total) by mouth daily. 05/18/15  Yes Agapito Games, MD  polycarbophil (FIBERCON) 625 MG tablet Take 625 mg by mouth daily.   Yes Historical Provider, MD  ticagrelor (BRILINTA) 90 MG TABS tablet Take 1 tablet (90 mg total) by mouth 2 (two) times daily. 11/20/14  Yes Rhonda G Barrett, PA-C   . aspirin EC  81 mg Oral Daily  . atorvastatin  80 mg Oral q1800  . [START ON 07/09/2015] pantoprazole (PROTONIX) IV  40 mg Intravenous Q12H  . sodium chloride flush  3 mL Intravenous Q12H   PRN Meds acetaminophen **OR** acetaminophen, nitroGLYCERIN Results for orders placed or performed during the hospital encounter of 07/05/15 (from the past 48 hour(s))  Comprehensive metabolic panel     Status: Abnormal   Collection Time: 07/05/15  6:23 PM  Result Value Ref Range   Sodium 139 135 - 145 mmol/L   Potassium 4.5 3.5 - 5.1 mmol/L   Chloride 106 101 - 111 mmol/L   CO2 23 22 - 32 mmol/L   Glucose, Bld 113 (H) 65 - 99 mg/dL   BUN 64 (H) 6 - 20 mg/dL   Creatinine, Ser 0.20 (H) 0.61 - 1.24 mg/dL   Calcium 9.5 8.9 - 42.9 mg/dL   Total Protein 5.9 (L) 6.5 - 8.1 g/dL   Albumin 3.9 3.5 - 5.0 g/dL   AST 36 15 - 41 U/L   ALT 36 17 - 63 U/L   Alkaline Phosphatase 57 38 - 126 U/L   Total Bilirubin 0.6 0.3 - 1.2 mg/dL   GFR calc non Af Amer 35 (L) >60 mL/min   GFR calc Af Amer 41 (L) >60 mL/min    Comment: (NOTE) The eGFR has been calculated using the CKD EPI equation. This calculation has not been validated in all clinical situations. eGFR's persistently <60 mL/min signify possible Chronic Kidney Disease.    Anion gap 10 5 - 15  CBC     Status: Abnormal   Collection Time: 07/05/15  6:23 PM  Result Value Ref Range   WBC 9.0 4.0 - 10.5 K/uL   RBC 3.57 (L) 4.22 - 5.81  MIL/uL   Hemoglobin 10.9 (L) 13.0 - 17.0 g/dL   HCT 54.9 (L) 58.0 - 90.1 %   MCV 92.4 78.0 - 100.0 fL   MCH 30.5 26.0 - 34.0 pg   MCHC 33.0 30.0 - 36.0 g/dL   RDW 85.7 71.2 - 77.5 %   Platelets 268 150 - 400 K/uL  Type and screen [facility]     Status: None (Preliminary  result)   Collection Time: 07/05/15  6:25 PM  Result Value Ref Range   ABO/RH(D) A NEG    Antibody Screen NEG    Sample Expiration 07/08/2015    Unit Number [id number]    Blood Component Type RED CELLS,LR    Unit division 00    Status of Unit ISSUED    Transfusion Status OK TO TRANSFUSE    Crossmatch Result Compatible    Unit Number [id number]    Blood Component Type RED CELLS,LR    Unit division 00    Status of Unit ISSUED    Transfusion Status OK TO TRANSFUSE    Crossmatch Result Compatible   ABO/Rh     Status: None   Collection Time: 07/05/15  6:25 PM  Result Value Ref Range   ABO/RH(D) A NEG   CBC     Status: Abnormal   Collection Time: 07/05/15 11:20 PM  Result Value Ref Range   WBC 7.6 4.0 - 10.5 K/uL   RBC 3.03 (L) 4.22 - 5.81 MIL/uL   Hemoglobin 9.2 (L) 13.0 - 17.0 g/dL   HCT 28.2 (L) 39.0 - 52.0 %   MCV 93.1 78.0 - 100.0 fL   MCH 30.4 26.0 - 34.0 pg   MCHC 32.6 30.0 - 36.0 g/dL   RDW 13.8 11.5 - 15.5 %   Platelets 224 150 - 400 K/uL  POC occult blood, ED     Status: None   Collection Time: 07/05/15 11:37 PM  Result Value Ref Range   Fecal Occult Bld NEGATIVE NEGATIVE  Prepare RBC     Status: None   Collection Time: 07/06/15  1:43 AM  Result Value Ref Range   Order Confirmation ORDER PROCESSED BY BLOOD BANK   CBC     Status: Abnormal   Collection Time: 07/06/15  5:18 AM  Result Value Ref Range   WBC 6.4 4.0 - 10.5 K/uL   RBC 3.11 (L) 4.22 - 5.81 MIL/uL   Hemoglobin 9.3 (L) 13.0 - 17.0 g/dL   HCT 28.4 (L) 39.0 - 52.0 %   MCV 91.3 78.0 - 100.0 fL   MCH 29.9 26.0 - 34.0 pg   MCHC 32.7 30.0 - 36.0 g/dL   RDW 14.7 11.5 - 15.5 %   Platelets 188 150 - 400 K/uL   Basic metabolic panel     Status: Abnormal   Collection Time: 07/06/15  5:18 AM  Result Value Ref Range   Sodium 142 135 - 145 mmol/L   Potassium 5.1 3.5 - 5.1 mmol/L   Chloride 111 101 - 111 mmol/L   CO2 22 22 - 32 mmol/L   Glucose, Bld 102 (H) 65 - 99 mg/dL   BUN 68 (H) 6 - 20 mg/dL   Creatinine, Ser 1.39 (H) 0.61 - 1.24 mg/dL   Calcium 8.5 (L) 8.9 - 10.3 mg/dL   GFR calc non Af Amer 54 (L) >60 mL/min   GFR calc Af Amer >60 >60 mL/min    Comment: (NOTE) The eGFR has been calculated using the CKD EPI equation. This calculation has not been validated in all clinical situations. eGFR's persistently <60 mL/min signify possible Chronic Kidney Disease.    Anion gap 9 5 - 15    No results found. ROS: as above in the present illness            Blood pressure 105/64, pulse 72, temperature 98.1 F (36.7 C), temperature source Oral, resp. rate 17, height '5\' 9"'$  (1.753 m), weight 78.926 kg (174  lb), SpO2 100 %.  Physical exam:   General-- healthy appearing white male in no distress ENT-- nonicteric mucous membranes moist Neck-- no lymphadenopathy Heart-- regular rate and rhythm without murmurs or gallops Lungs-- clear Abdomen-- soft and nontender Psych-- alert and oriented answers questions appropriately   Assessment: 1. Probable G.I. bleed. Can't really explain why his stools were negative that he's had several days of melenic stools and significant drop in hemoglobin. He also has had dyspepsia and is recently stopped PPI therapy and gone back on H2 blocker. I think he needs EGD. 2. History of colon polyps. Remove the colonoscopy 2015 3. History of diverticulosis 4. CAD with drug eluting coronary stent in place  Plan:  1. Will plan EGD in the morning at 9 o'clock by Dr. Michail Sermon. Have discussed this with the patient. Will make him NPO after midnight. Agree with continuing PPI therapy empirically. In case he bleeds acutely during the night we will change his diet clear  liquids.    Lashaunda Schild JR,Wenzel Backlund L 07/06/2015, 11:47 AM   This note was created using voice recognition software and minor errors may Have occurred unintentionally. Pager: [phone] If no answer or after hours call [phone]

## 2015-07-06 NOTE — Progress Notes (Addendum)
Same Day Note  Patient seen and examined. H and P from this AM reviewed.  Briefly, 60 y.o. gentleman with a history of CAD, NSTEMI last summer, S/P DES to LAD (on aspirin and Brilinta), HTN, and hypercholesterolemia who developed melena 2-3 days prior to admission.  Symptomatic anemia secondary to melena --Patient given 2 units PRBC's overnight --Hold ASA and Brilinta due to acute bleeding with hemodynamically instability, unless cardiology recommends otherwise --Continue Protonix IV 80mg  bolus (total) then infusion at 8mg /hr --Appreciate GI input. Plans for EGD in AM. Clears for now, NPO after midnight  CAD with DES to LAD --Holding antihypertensives due to relative hypotension --Continue statin --Holding ASA and Brilinta as noted above --Cardiology was consulted in ED, per documentation, low risk for holding anticoagulation at this time.   Lee Taylor [facility] Pager: [phone]

## 2015-07-07 ENCOUNTER — Encounter (HOSPITAL_COMMUNITY)
Admission: EM | Disposition: A | Discharge status: Home / Self Care | Payer: Self-pay | Source: Home / Self Care | Attending: Internal Medicine

## 2015-07-07 ENCOUNTER — Encounter (HOSPITAL_COMMUNITY): Payer: Self-pay

## 2015-07-07 HISTORY — PX: ESOPHAGOGASTRODUODENOSCOPY: SHX5428

## 2015-07-07 LAB — CBC
HEMATOCRIT: 25.8 % — AB (ref 39.0–52.0)
Hemoglobin: 8.7 g/dL — ABNORMAL LOW (ref 13.0–17.0)
MCH: 30.3 pg (ref 26.0–34.0)
MCHC: 33.7 g/dL (ref 30.0–36.0)
MCV: 89.9 fL (ref 78.0–100.0)
Platelets: 194 10*3/uL (ref 150–400)
RBC: 2.87 MIL/uL — AB (ref 4.22–5.81)
RDW: 15.1 % (ref 11.5–15.5)
WBC: 9.8 10*3/uL (ref 4.0–10.5)

## 2015-07-07 SURGERY — EGD (ESOPHAGOGASTRODUODENOSCOPY)
Anesthesia: Moderate Sedation

## 2015-07-07 MED ORDER — FENTANYL CITRATE (PF) 100 MCG/2ML IJ SOLN
INTRAMUSCULAR | Status: DC | PRN
  Administered 2015-07-07: 25 ug via INTRAVENOUS

## 2015-07-07 MED ORDER — DIPHENHYDRAMINE HCL 50 MG/ML IJ SOLN
INTRAMUSCULAR | Status: AC
  Filled 2015-07-07: qty 1

## 2015-07-07 MED ORDER — MIDAZOLAM HCL 5 MG/ML IJ SOLN
INTRAMUSCULAR | Status: AC
  Filled 2015-07-07: qty 2

## 2015-07-07 MED ORDER — FENTANYL CITRATE (PF) 100 MCG/2ML IJ SOLN
INTRAMUSCULAR | Status: AC
  Filled 2015-07-07: qty 2

## 2015-07-07 MED ORDER — BUTAMBEN-TETRACAINE-BENZOCAINE 2-2-14 % EX AERO
INHALATION_SPRAY | CUTANEOUS | Status: DC | PRN
  Administered 2015-07-07: 2 via TOPICAL

## 2015-07-07 MED ORDER — DIPHENHYDRAMINE HCL 50 MG/ML IJ SOLN
INTRAMUSCULAR | Status: DC | PRN
  Administered 2015-07-07 (×2): 25 mg via INTRAVENOUS

## 2015-07-07 MED ORDER — MIDAZOLAM HCL 10 MG/2ML IJ SOLN
INTRAMUSCULAR | Status: DC | PRN
  Administered 2015-07-07: 2 mg via INTRAVENOUS

## 2015-07-07 NOTE — Brief Op Note (Addendum)
Minimal duodenitis otherwise normal EGD. Capsule endoscopy on 07/09/15 (lack of adequate GI equipment to do tomorrow). Soft food today today. Heart healthy tomorrow until 2000 and then clear liquids. NPO p MN Sunday night. Supportive care.

## 2015-07-07 NOTE — Interval H&P Note (Signed)
History and Physical Interval Note:  07/07/2015 12:18 PM  Lee Taylor  has presented today for surgery, with the diagnosis of GIbleed  The various methods of treatment have been discussed with the patient and family. After consideration of risks, benefits and other options for treatment, the patient has consented to  Procedure(s): ESOPHAGOGASTRODUODENOSCOPY (EGD) (N/A) as a surgical intervention .  The patient's history has been reviewed, patient examined, no change in status, stable for surgery.  I have reviewed the patient's chart and labs.  Questions were answered to the patient's satisfaction.     [name] C.

## 2015-07-07 NOTE — Op Note (Signed)
[facility] [address] [place], [id number]   ENDOSCOPY PROCEDURE REPORT  PATIENT: Lee ManifoldBeaudry, [name]  MR#: [id number] BIRTHDATE: Oct 13, 1955 , 59  yrs. old GENDER: male ENDOSCOPIST: Charlott RakesVincent Terecia Plaut, MD REFERRED BY:  hospital team PROCEDURE DATE:  07/07/2015 PROCEDURE:  EGD, diagnostic ASA CLASS:     Class III INDICATIONS:  melena. MEDICATIONS: Benadryl 50 mg IV, Fentanyl 25 mcg IV, and Versed 2 mg IV TOPICAL ANESTHETIC: Cetacaine Spray  DESCRIPTION OF PROCEDURE: After the risks benefits and alternatives of the procedure were thoroughly explained, informed consent was obtained.  The Pentax Gastroscope [id number] endoscope was introduced through the mouth and advanced to the second portion of the duodenum , Without limitations.  The instrument was slowly withdrawn as the mucosa was fully examined. Estimated blood loss is zero unless otherwise noted in this procedure report.    Esophagus normal. GEJ 40 cm from the incisors. Stomach normal. A few small patchy erosions in duodenal bulb c/w minimal duodenitis. 2nd part of the duodenum normal in appearance.        Retroflexed views revealed no abnormalities.     The scope was then withdrawn from the patient and the procedure completed.  COMPLICATIONS: There were no immediate complications.  ENDOSCOPIC IMPRESSION:     Minimal duodenitis otherwise normal EGD  RECOMMENDATIONS:     Capsule endoscopy; Supportive care   eSigned:  Charlott RakesVincent Isador Castille, MD 07/07/2015 12:45 PM    CC:  CPT CODES: ICD CODES:  The ICD and CPT codes recommended by this software are interpretations from the data that the clinical staff has captured with the software.  The verification of the translation of this report to the ICD and CPT codes and modifiers is the sole responsibility of the health care institution and practicing physician where this report was generated.  [employer] will not be held responsible  for the validity of the ICD and CPT codes included on this report.  AMA assumes no liability for data contained or not contained herein. CPT is a Publishing rights managerregistered trademark of the [employer].

## 2015-07-07 NOTE — Progress Notes (Signed)
[facility] PROGRESS NOTE  Lee Taylor ZOX:[id number] DOB: Jun 03, 1955 DOA: 07/05/2015 PCP: Lee Gasser, MD  HPI/Brief narrative 60 y.o. gentleman with a history of CAD, NSTEMI last summer, S/P DES to LAD (on aspirin and Brilinta), HTN, and hypercholesterolemia who developed melena 2-3 days ago. He did not think much of the dark-colored stools until he became light-headed, dizzy, with vision disturbance and near syncope today. No loss of consciousness. No chest pain or shortness of breath. No nausea or vomiting. No abdominal pain. No bright red blood per rectum. No prior history of GI bleed.  Assessment/Plan: Symptomatic anemia secondary to melena --Patient given 2 units PRBC's overnight --Hold ASA and Brilinta due to acute bleeding with hemodynamically instability --For now, continue Protonix IV --Appreciate GI input. S/p EGD with findings of minimal duodenitis. Plans for NPO after midnight and capsule study tomorrow  CAD with DES to LAD --Holding antihypertensives due to relative hypotension --Continue statin --Holding ASA and Brilinta as noted above --Cardiology was consulted in ED, per documentation, low risk for holding anticoagulation at this time.   Code Status: Full Family Communication: Pt in room Disposition Plan: Possible d/c if stable in 2-3 days   Consultants:  GI  Procedures:    Antibiotics: Anti-infectives    None      HPI/Subjective: No complaints. No abd pain  Objective: Filed Vitals:   07/07/15 1240 07/07/15 1245 07/07/15 1249 07/07/15 1250  BP: 90/35 86/33  103/48  Pulse: 90 91 85 85  Temp:      TempSrc:      Resp: Height:      Weight:      SpO2: 100% 100% 100% 100%    Intake/Output Summary (Last 24 hours) at 07/07/15 1433 Last data filed at 07/07/15 1134  Gross per 24 hour  Intake      0 ml  Output    850 ml  Net   -850 ml   Filed Weights   07/05/15 1818 07/06/15 1958 07/07/15 1021  Weight: 78.926 kg  (174 lb) 78.2 kg (172 lb 6.4 oz) 78.019 kg (172 lb)    Exam:   General:  Awake, in nad  Cardiovascular: regular, s1, s2  Respiratory: normal resp effort, no wheezing  Abdomen: soft,nondistended  Musculoskeletal: perfused, no clubbing   Data Reviewed: Basic Metabolic Panel:  Recent Labs Lab 07/05/15 1823 07/06/15 0518  NA 139 142  K 4.5 5.1  CL 106 111  CO2 23 22  GLUCOSE 113* 102*  BUN 64* 68*  CREATININE 1.99* 1.39*  CALCIUM 9.5 8.5*   Liver Function Tests:  Recent Labs Lab 07/05/15 1823  AST 36  ALT 36  ALKPHOS 57  BILITOT 0.6  PROT 5.9*  ALBUMIN 3.9   No results for input(s): LIPASE, AMYLASE in the last 168 hours. No results for input(s): AMMONIA in the last 168 hours. CBC:  Recent Labs Lab 07/05/15 1823 07/05/15 2320 07/06/15 0518 07/06/15 1536 07/07/15 0452  WBC 9.0 7.6 6.4  --  9.8  HGB 10.9* 9.2* 9.3* 10.8* 8.7*  HCT 33.0* 28.2* 28.4*  --  25.8*  MCV 92.4 93.1 91.3  --  89.9  PLT 268 224 188  --  194   Cardiac Enzymes: No results for input(s): CKTOTAL, CKMB, CKMBINDEX, TROPONINI in the last 168 hours. BNP (last 3 results) No results for input(s): BNP in the last 8760 hours.  ProBNP (last 3 results) No results for input(s): PROBNP in the last 8760 hours.  CBG: No results  for input(s): GLUCAP in the last 168 hours.  No results found for this or any previous visit (from the past 240 hour(s)).   Studies: No results found.  Scheduled Meds: . aspirin EC  81 mg Oral Daily  . atorvastatin  80 mg Oral q1800  . [START ON 07/09/2015] pantoprazole (PROTONIX) IV  40 mg Intravenous Q12H  . sodium chloride flush  3 mL Intravenous Q12H   Continuous Infusions: . sodium chloride 75 mL/hr (07/06/15 0948)  . pantoprozole (PROTONIX) infusion 8 mg/hr (07/06/15 2356)    Principal Problem:   GI bleed Active Problems:   CAD (coronary artery disease), native coronary artery   Acute blood loss anemia   Symptomatic anemia   Melena   S/P drug  eluting coronary stent placement   Upper GI bleed    Lee Taylor K  [facility] Pager [phone]. If 7PM-7AM, please contact night-coverage at [email or web address], password [email or web address] 07/07/2015, 2:33 PM  LOS: 1 day

## 2015-07-07 NOTE — H&P (View-Only) (Signed)
[facility] CONSULT Reason for consult: G.I. bleeding Referring Physician: [facility]. PCP: Dr Beatrice Lecher. 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  Lee Taylor is an 60 y.o. male.  HPI: he has a history of CAD and NSTEMI last year has been on aspirin and Brilinta. He has had chronic dyspepsia had previously was on Zantac. He was having breakthrough symptoms and took one month Protonix with improvement and then switch back to Zantac 150 BID about 2 weeks ago. He felt lightheaded work. He had a new job and has been under a lot of stress with his new job. He had been having dark colored stools for a couple days. He denies any increase in his dyspepsia. He presented to the emergency room with the symptoms. Initial hemoglobin 10.9 dropping to 9.3 while in the emergency room. One dark stool that was actually noted to be hemoccult negative. Patient has had colonoscopy done in [facility] 5/15 with diverticulosis; polyp removed he was told that he needed to have the procedure repeated in 5 years.  Past Medical History  Diagnosis Date  . Hypertension   . Coronary artery disease 10/2014    a. cath 10/23/2014 2.5 x 20 Synergy DES to the LAD, EF 40-45 percent   b. relook cath 6/29, patent stents, medical management  . GERD (gastroesophageal reflux disease)   . Hypercholesterolemia   . NSTEMI (non-ST elevated myocardial infarction) ([place]) 10/20/2014    2.5 x 20 Synergy DES to the LAD, EF 40-45 percent  . Kidney stones     "passed it"  . Acute kidney injury ([place]) 10/23/2014    "related to the MI"    Past Surgical History  Procedure Laterality Date  . Tonsillectomy    . Tympanoplasty Left 1960's    "perforated eardrum"  . Coronary angioplasty with stent placement  10/23/2014    2.5 x 20 Synergy DES to the LAD  . Cardiac catheterization N/A 10/23/2014    Procedure: Left Heart Cath and Coronary Angiography;  Surgeon: Jettie Booze, MD; LAD 95%,  thrombotic, small ramus intermedius, CFX and OM branch is patent, RCA 20%     . Cardiac catheterization N/A 10/23/2014    Procedure: Coronary Stent Intervention;  Surgeon: Jettie Booze, MD; 2.5 x 20 Synergy DES to the LAD    . Cardiac catheterization  11/01/2014  . Cardiac catheterization N/A 11/01/2014    Procedure: Left Heart Cath and Coronary Angiography;  Surgeon: Troy Sine, MD;  Location: [facility];  Service: Cardiovascular;  Laterality: N/A;    Family History  Problem Relation Age of Onset  . Heart failure Mother   . Heart attack Sister 48    smoker    . Heart disease Mother     triple bypass   . Stroke Father     Social History:  reports that he has never smoked. He has never used smokeless tobacco. He reports that he drinks alcohol. He reports that he does not use illicit drugs.  Allergies:  Allergies  Allergen Reactions  . Amoxicillin Hives    Medications; Prior to Admission medications   Medication Sig Start Date End Date Taking? Authorizing Provider  aspirin 81 MG tablet Take 81 mg by mouth daily.     Yes Historical Provider, MD  atorvastatin (LIPITOR) 80 MG tablet Take 1 tablet (80 mg total) by mouth daily at 6 PM. 10/24/14  Yes Rhonda G Barrett, PA-C  Calcium Carb-Cholecalciferol 600-800 MG-UNIT TABS Take 1 tablet by mouth daily.  Yes Historical Provider, MD  Ginkgo Biloba 40 MG TABS Take 60 mg by mouth daily.    Yes Historical Provider, MD  isosorbide mononitrate (IMDUR) 30 MG 24 hr tablet Take 1 tablet (30 mg total) by mouth daily. 11/05/14  Yes Brett Canales, PA-C  lisinopril (PRINIVIL,ZESTRIL) 20 MG tablet Take 1 tablet (20 mg total) by mouth daily. 05/18/15  Yes Hali Marry, MD  metoprolol tartrate (LOPRESSOR) 25 MG tablet Take 0.5 tablets (12.5 mg total) by mouth 2 (two) times daily. 05/18/15  Yes Hali Marry, MD  Multiple Vitamin (MULTIVITAMIN) capsule Take 1 capsule by mouth daily.     Yes Historical Provider, MD   nitroGLYCERIN (NITROSTAT) 0.4 MG SL tablet Place 1 tablet (0.4 mg total) under the tongue every 5 (five) minutes as needed for chest pain. 10/24/14  Yes Rhonda G Barrett, PA-C  pantoprazole (PROTONIX) 40 MG tablet Take 1 tablet (40 mg total) by mouth daily. 05/18/15  Yes Hali Marry, MD  polycarbophil (FIBERCON) 625 MG tablet Take 625 mg by mouth daily.   Yes Historical Provider, MD  ticagrelor (BRILINTA) 90 MG TABS tablet Take 1 tablet (90 mg total) by mouth 2 (two) times daily. 11/20/14  Yes Rhonda G Barrett, PA-C   . aspirin EC  81 mg Oral Daily  . atorvastatin  80 mg Oral q1800  . [START ON 07/09/2015] pantoprazole (PROTONIX) IV  40 mg Intravenous Q12H  . sodium chloride flush  3 mL Intravenous Q12H   PRN Meds acetaminophen **OR** acetaminophen, nitroGLYCERIN Results for orders placed or performed during the hospital encounter of 07/05/15 (from the past 48 hour(s))  Comprehensive metabolic panel     Status: Abnormal   Collection Time: 07/05/15  6:23 PM  Result Value Ref Range   Sodium 139 135 - 145 mmol/L   Potassium 4.5 3.5 - 5.1 mmol/L   Chloride 106 101 - 111 mmol/L   CO2 23 22 - 32 mmol/L   Glucose, Bld 113 (H) 65 - 99 mg/dL   BUN 64 (H) 6 - 20 mg/dL   Creatinine, Ser 1.99 (H) 0.61 - 1.24 mg/dL   Calcium 9.5 8.9 - 10.3 mg/dL   Total Protein 5.9 (L) 6.5 - 8.1 g/dL   Albumin 3.9 3.5 - 5.0 g/dL   AST 36 15 - 41 U/L   ALT 36 17 - 63 U/L   Alkaline Phosphatase 57 38 - 126 U/L   Total Bilirubin 0.6 0.3 - 1.2 mg/dL   GFR calc non Af Amer 35 (L) >60 mL/min   GFR calc Af Amer 41 (L) >60 mL/min    Comment: (NOTE) The eGFR has been calculated using the CKD EPI equation. This calculation has not been validated in all clinical situations. eGFR's persistently <60 mL/min signify possible Chronic Kidney Disease.    Anion gap 10 5 - 15  CBC     Status: Abnormal   Collection Time: 07/05/15  6:23 PM  Result Value Ref Range   WBC 9.0 4.0 - 10.5 K/uL   RBC 3.57 (L) 4.22 - 5.81  MIL/uL   Hemoglobin 10.9 (L) 13.0 - 17.0 g/dL   HCT 33.0 (L) 39.0 - 52.0 %   MCV 92.4 78.0 - 100.0 fL   MCH 30.5 26.0 - 34.0 pg   MCHC 33.0 30.0 - 36.0 g/dL   RDW 13.9 11.5 - 15.5 %   Platelets 268 150 - 400 K/uL  Type and screen Lee Taylor     Status: None (Preliminary  result)   Collection Time: 07/05/15  6:25 PM  Result Value Ref Range   ABO/RH(D) A NEG    Antibody Screen NEG    Sample Expiration 07/08/2015    Unit Number [id number]    Blood Component Type RED CELLS,LR    Unit division 00    Status of Unit ISSUED    Transfusion Status OK TO TRANSFUSE    Crossmatch Result Compatible    Unit Number [id number]    Blood Component Type RED CELLS,LR    Unit division 00    Status of Unit ISSUED    Transfusion Status OK TO TRANSFUSE    Crossmatch Result Compatible   ABO/Rh     Status: None   Collection Time: 07/05/15  6:25 PM  Result Value Ref Range   ABO/RH(D) A NEG   CBC     Status: Abnormal   Collection Time: 07/05/15 11:20 PM  Result Value Ref Range   WBC 7.6 4.0 - 10.5 K/uL   RBC 3.03 (L) 4.22 - 5.81 MIL/uL   Hemoglobin 9.2 (L) 13.0 - 17.0 g/dL   HCT 28.2 (L) 39.0 - 52.0 %   MCV 93.1 78.0 - 100.0 fL   MCH 30.4 26.0 - 34.0 pg   MCHC 32.6 30.0 - 36.0 g/dL   RDW 13.8 11.5 - 15.5 %   Platelets 224 150 - 400 K/uL  POC occult blood, ED     Status: None   Collection Time: 07/05/15 11:37 PM  Result Value Ref Range   Fecal Occult Bld NEGATIVE NEGATIVE  Prepare RBC     Status: None   Collection Time: 07/06/15  1:43 AM  Result Value Ref Range   Order Confirmation ORDER PROCESSED BY BLOOD BANK   CBC     Status: Abnormal   Collection Time: 07/06/15  5:18 AM  Result Value Ref Range   WBC 6.4 4.0 - 10.5 K/uL   RBC 3.11 (L) 4.22 - 5.81 MIL/uL   Hemoglobin 9.3 (L) 13.0 - 17.0 g/dL   HCT 28.4 (L) 39.0 - 52.0 %   MCV 91.3 78.0 - 100.0 fL   MCH 29.9 26.0 - 34.0 pg   MCHC 32.7 30.0 - 36.0 g/dL   RDW 14.7 11.5 - 15.5 %   Platelets 188 150 - 400 K/uL   Basic metabolic panel     Status: Abnormal   Collection Time: 07/06/15  5:18 AM  Result Value Ref Range   Sodium 142 135 - 145 mmol/L   Potassium 5.1 3.5 - 5.1 mmol/L   Chloride 111 101 - 111 mmol/L   CO2 22 22 - 32 mmol/L   Glucose, Bld 102 (H) 65 - 99 mg/dL   BUN 68 (H) 6 - 20 mg/dL   Creatinine, Ser 1.39 (H) 0.61 - 1.24 mg/dL   Calcium 8.5 (L) 8.9 - 10.3 mg/dL   GFR calc non Af Amer 54 (L) >60 mL/min   GFR calc Af Amer >60 >60 mL/min    Comment: (NOTE) The eGFR has been calculated using the CKD EPI equation. This calculation has not been validated in all clinical situations. eGFR's persistently <60 mL/min signify possible Chronic Kidney Disease.    Anion gap 9 5 - 15    No results found. ROS: as above in the present illness            Blood pressure 105/64, pulse 72, temperature 98.1 F (36.7 C), temperature source Oral, resp. rate 17, height '5\' 9"'$  (1.753 m), weight 78.926 kg (174  lb), SpO2 100 %.  Physical exam:   General-- healthy appearing white male in no distress ENT-- nonicteric mucous membranes moist Neck-- no lymphadenopathy Heart-- regular rate and rhythm without murmurs or gallops Lungs-- clear Abdomen-- soft and nontender Psych-- alert and oriented answers questions appropriately   Assessment: 1. Probable G.I. bleed. Can't really explain why his stools were negative that he's had several days of melenic stools and significant drop in hemoglobin. He also has had dyspepsia and is recently stopped PPI therapy and gone back on H2 blocker. I think he needs EGD. 2. History of colon polyps. Remove the colonoscopy 2015 3. History of diverticulosis 4. CAD with drug eluting coronary stent in place  Plan:  1. Will plan EGD in the morning at 9 o'clock by Dr. Michail Sermon. Have discussed this with the patient. Will make him NPO after midnight. Agree with continuing PPI therapy empirically. In case he bleeds acutely during the night we will change his diet clear  liquids.    Tonie Vizcarrondo JR,Carsynn Bethune L 07/06/2015, 11:47 AM   This note was created using voice recognition software and minor errors may Have occurred unintentionally. Pager: [phone] If no answer or after hours call [phone]

## 2015-07-08 LAB — BASIC METABOLIC PANEL
Anion gap: 4 — ABNORMAL LOW (ref 5–15)
BUN: 42 mg/dL — AB (ref 6–20)
CHLORIDE: 114 mmol/L — AB (ref 101–111)
CO2: 25 mmol/L (ref 22–32)
Calcium: 8.1 mg/dL — ABNORMAL LOW (ref 8.9–10.3)
Creatinine, Ser: 1.4 mg/dL — ABNORMAL HIGH (ref 0.61–1.24)
GFR calc Af Amer: >60 mL/min (ref >60)
GFR calc non Af Amer: 54 mL/min — ABNORMAL LOW (ref >60)
GLUCOSE: 107 mg/dL — AB (ref 65–99)
POTASSIUM: 4.4 mmol/L (ref 3.5–5.1)
Sodium: 143 mmol/L (ref 135–145)

## 2015-07-08 LAB — TROPONIN I
TROPONIN I: 0.04 ng/mL — AB (ref <0.031)
Troponin I: 0.06 ng/mL — ABNORMAL HIGH (ref <0.031)

## 2015-07-08 LAB — CBC
HEMATOCRIT: 21.5 % — AB (ref 39.0–52.0)
Hemoglobin: 7 g/dL — ABNORMAL LOW (ref 13.0–17.0)
MCH: 30 pg (ref 26.0–34.0)
MCHC: 32.6 g/dL (ref 30.0–36.0)
MCV: 92.3 fL (ref 78.0–100.0)
Platelets: 170 10*3/uL (ref 150–400)
RBC: 2.33 MIL/uL — ABNORMAL LOW (ref 4.22–5.81)
RDW: 15.2 % (ref 11.5–15.5)
WBC: 6.4 10*3/uL (ref 4.0–10.5)

## 2015-07-08 LAB — HEMOGLOBIN AND HEMATOCRIT, BLOOD
HCT: 28.1 % — ABNORMAL LOW (ref 39.0–52.0)
Hemoglobin: 9.4 g/dL — ABNORMAL LOW (ref 13.0–17.0)

## 2015-07-08 LAB — PREPARE RBC (CROSSMATCH)

## 2015-07-08 MED ORDER — SODIUM CHLORIDE 0.9 % IV SOLN
Freq: Once | INTRAVENOUS | Status: AC
  Administered 2015-07-08: 09:00:00 via INTRAVENOUS

## 2015-07-08 NOTE — Progress Notes (Signed)
Patient ID: Lee Taylor, male   DOB: 07/13/1955, 60 y.o.   MRN: [id number] [name]  Lee Taylor 60 y.o. 01/07/1956   Subjective: Doing ok now. Reports having an episode of tingling in his hands and chest pain and he thought he was going to die. Nurse at bedside reports that it resolved with oxygen. Finance at bedside and frustrated with fact that he did not get any food this morning and frustrated with delay in ability of GI dept to do capsule endoscopy.   Objective: Vital signs: Filed Vitals:   07/08/15 1115 07/08/15 1215  BP: 111/58 129/69  Pulse: 88 96  Temp: 98.8 F (37.1 C) 98.3 F (36.8 C)  Resp: 18 18    Physical Exam: Gen: alert, no acute distress  HEENT: anicteric CV: RRR Chest: CTA B Abd: soft, nontender, nondistended, +BS  Lab Results:  Recent Labs  07/06/15 0518 07/08/15 0517  NA 142 143  K 5.1 4.4  CL 111 114*  CO2 22 25  GLUCOSE 102* 107*  BUN 68* 42*  CREATININE 1.39* 1.40*  CALCIUM 8.5* 8.1*    Recent Labs  07/05/15 1823  AST 36  ALT 36  ALKPHOS 57  BILITOT 0.6  PROT 5.9*  ALBUMIN 3.9    Recent Labs  07/07/15 0452 07/08/15 0517  WBC 9.8 6.4  HGB 8.7* 7.0*  HCT 25.8* 21.5*  MCV 89.9 92.3  PLT 194 170      Assessment/Plan: [name]. Capsule endoscopy tomorrow morning and if that is unrevealing, then will need a hematology consult. Heart healthy diet ordered yesterday and today but somehow it was cancelled last night. Heart healthy diet resumed and clear liquids at 8 pm and NPO p MN in anticipation of capsule endoscopy tomorrow morning. Dr. Matthias HughsBuccini will f/u on capsule results when complete.   [name] 07/08/2015, 12:41 PM  Pager [phone]  If no answer or after 5 PM call [phone]

## 2015-07-08 NOTE — Progress Notes (Signed)
[facility] PROGRESS NOTE  Lee Taylor [id number]:[id number] DOB: [birth year] DOA: 07/05/2015 PCP: Nani Gasser, MD  HPI/Brief narrative 60 y.o. gentleman with a history of CAD, NSTEMI last summer, S/P DES to LAD (on aspirin and Brilinta), HTN, and hypercholesterolemia who developed melena 2-3 days ago. He did not think much of the dark-colored stools until he became light-headed, dizzy, with vision disturbance and near syncope today. No loss of consciousness. No chest pain or shortness of breath. No nausea or vomiting. No abdominal pain. No bright red blood per rectum. No prior history of GI bleed.  Assessment/Plan: Symptomatic anemia secondary to melena --Patient given 2 units PRBC's overnight on day of admission --Hold ASA and Brilinta due to acute bleeding with hemodynamically instability --For now, continue Protonix IV --Hgb 7 this AM. 2 units PRBCs ordered. Follow CBC and transfuse as needed --Appreciate GI input. S/p EGD with findings of minimal duodenitis. Pending capsule study  CAD with DES to LAD --Holding antihypertensives due to relative hypotension --Continue statin --Holding ASA and Brilinta as noted above --Cardiology was consulted in ED, per documentation, low risk for holding anticoagulation at this time.  --Chest pain noted overnight, however suspect related to worsened anemia. Currently pain free. Transfuse per below  Code Status: Full Family Communication: Pt in room Disposition Plan: Uncertain at this time   Consultants:  GI  Procedures:    Antibiotics: Anti-infectives    None      HPI/Subjective: Noted to have self-limiting chest pains overnight, since resolved.  Objective: Filed Vitals:   07/08/15 0920 07/08/15 1115 07/08/15 1215 07/08/15 1244  BP: 115/63 111/58 129/69 116/69  Pulse: 94 88 96 92  Temp: 98 F (36.7 C) 98.8 F (37.1 C) 98.3 F (36.8 C) 98.7 F (37.1 C)  TempSrc: Oral Oral Oral Oral  Resp: Height:       Weight:      SpO2: 99% 96% 100% 99%    Intake/Output Summary (Last 24 hours) at 07/08/15 1439 Last data filed at 07/08/15 0318  Gross per 24 hour  Intake      0 ml  Output    500 ml  Net   -500 ml   Filed Weights   07/05/15 1818 07/06/15 1958 07/07/15 1021  Weight: 78.926 kg (174 lb) 78.2 kg (172 lb 6.4 oz) 78.019 kg (172 lb)    Exam:   General:  Awake, in nad, laying in bed  Cardiovascular: regular, s1, s2  Respiratory: normal resp effort, no wheezing  Abdomen: soft,nondistended  Musculoskeletal: perfused, no clubbing, no cyanosis  Data Reviewed: Basic Metabolic Panel:  Recent Labs Lab 07/05/15 1823 07/06/15 0518 07/08/15 0517  NA 139 142 143  K 4.5 5.1 4.4  CL 106 111 114*  CO2 GLUCOSE 113* 102* 107*  BUN 64* 68* 42*  CREATININE 1.99* 1.39* 1.40*  CALCIUM 9.5 8.5* 8.1*   Liver Function Tests:  Recent Labs Lab 07/05/15 1823  AST 36  ALT 36  ALKPHOS 57  BILITOT 0.6  PROT 5.9*  ALBUMIN 3.9   No results for input(s): LIPASE, AMYLASE in the last 168 hours. No results for input(s): AMMONIA in the last 168 hours. CBC:  Recent Labs Lab 07/05/15 1823 07/05/15 2320 07/06/15 0518 07/06/15 1536 07/07/15 0452 07/08/15 0517  WBC 9.0 7.6 6.4  --  9.8 6.4  HGB 10.9* 9.2* 9.3* 10.8* 8.7* 7.0*  HCT 33.0* 28.2* 28.4*  --  25.8* 21.5*  MCV 92.4 93.1 91.3  --  89.9 92.3  PLT 268 224 188  --  194 170   Cardiac Enzymes: No results for input(s): CKTOTAL, CKMB, CKMBINDEX, TROPONINI in the last 168 hours. BNP (last 3 results) No results for input(s): BNP in the last 8760 hours.  ProBNP (last 3 results) No results for input(s): PROBNP in the last 8760 hours.  CBG: No results for input(s): GLUCAP in the last 168 hours.  No results found for this or any previous visit (from the past 240 hour(s)).   Studies: No results found.  Scheduled Meds: . aspirin EC  81 mg Oral Daily  . atorvastatin  80 mg Oral q1800  . [START ON 07/09/2015]  pantoprazole (PROTONIX) IV  40 mg Intravenous Q12H  . sodium chloride flush  3 mL Intravenous Q12H   Continuous Infusions: . sodium chloride 75 mL/hr at 07/08/15 1118  . pantoprozole (PROTONIX) infusion 8 mg/hr (07/07/15 1723)    Principal Problem:   GI bleed Active Problems:   CAD (coronary artery disease), native coronary artery   Acute blood loss anemia   Symptomatic anemia   Melena   S/P drug eluting coronary stent placement   Upper GI bleed    Lee Taylor K  [facility] Pager [phone]. If 7PM-7AM, please contact night-coverage at [email or web address], password [email or web address] 07/08/2015, 2:39 PM  LOS: 2 days

## 2015-07-09 ENCOUNTER — Encounter (HOSPITAL_COMMUNITY): Payer: Self-pay | Admitting: Gastroenterology

## 2015-07-09 ENCOUNTER — Encounter (HOSPITAL_COMMUNITY)
Admission: EM | Disposition: A | Discharge status: Home / Self Care | Payer: Self-pay | Source: Home / Self Care | Attending: Internal Medicine

## 2015-07-09 HISTORY — PX: GIVENS CAPSULE STUDY: SHX5432

## 2015-07-09 LAB — TYPE AND SCREEN
ABO/RH(D): A NEG
ANTIBODY SCREEN: NEGATIVE
UNIT DIVISION: 0
Unit division: 0
Unit division: 0
Unit division: 0

## 2015-07-09 LAB — CBC
HCT: 24.9 % — ABNORMAL LOW (ref 39.0–52.0)
HEMOGLOBIN: 8.4 g/dL — AB (ref 13.0–17.0)
MCH: 30.2 pg (ref 26.0–34.0)
MCHC: 33.7 g/dL (ref 30.0–36.0)
MCV: 89.6 fL (ref 78.0–100.0)
PLATELETS: 143 10*3/uL — AB (ref 150–400)
RBC: 2.78 MIL/uL — AB (ref 4.22–5.81)
RDW: 16.3 % — ABNORMAL HIGH (ref 11.5–15.5)
WBC: 7 10*3/uL (ref 4.0–10.5)

## 2015-07-09 LAB — OCCULT BLOOD X 1 CARD TO LAB, STOOL: FECAL OCCULT BLD: NEGATIVE

## 2015-07-09 LAB — TROPONIN I

## 2015-07-09 SURGERY — IMAGING PROCEDURE, GI TRACT, INTRALUMINAL, VIA CAPSULE
Anesthesia: LOCAL

## 2015-07-09 SURGICAL SUPPLY — 1 items: TOWEL COTTON PACK 4EA (MISCELLANEOUS) ×4 IMPLANT

## 2015-07-09 NOTE — Progress Notes (Signed)
[facility] PROGRESS NOTE  Lee Manifoldhomas Wolfson WUJ:[id number] DOB: 09/22/1955 DOA: 07/05/2015 PCP: Nani GasserMETHENEY,CATHERINE, MD  HPI/Brief narrative 60 y.o. gentleman with a history of CAD, NSTEMI last summer, S/P DES to LAD (on aspirin and Brilinta), HTN, and hypercholesterolemia who developed melena 2-3 days ago. He did not think much of the dark-colored stools until he became light-headed, dizzy, with vision disturbance and near syncope today. No loss of consciousness. No chest pain or shortness of breath. No nausea or vomiting. No abdominal pain. No bright red blood per rectum. No prior history of GI bleed.  Assessment/Plan: Symptomatic anemia secondary to melena --Patient given 2 units PRBC's overnight on day of admission --Hold ASA and Brilinta due to acute bleeding with hemodynamically instability --For now, continue Protonix IV --Hgb 7 on 3/5, given 2 units PRBCs. Continue to follow CBC and transfuse as needed --Appreciate GI input. S/p EGD with findings of minimal duodenitis. Capsule study underway  CAD with DES to LAD --Holding antihypertensives due to relative hypotension --Continue statin --Holding ASA and Brilinta as noted above --Cardiology was consulted in ED, per documentation, low risk for holding anticoagulation at this time.  --Chest pain noted overnight, however suspect related to worsened anemia. Currently pain free. Transfuse as needed per above  Code Status: Full Family Communication: Pt in room Disposition Plan: Unknown at this time   Consultants:  GI  Procedures:    Antibiotics: Anti-infectives    None      HPI/Subjective: No complaints this AM. No chest pain or sob  Objective: Filed Vitals:   07/08/15 1602 07/08/15 2143 07/09/15 0500 07/09/15 1409  BP: 125/80 123/70 119/62 121/65  Pulse: 101 74 73 77  Temp: 98.1 F (36.7 C) 98.6 F (37 C) 98 F (36.7 C) 98.5 F (36.9 C)  TempSrc: Oral Oral Oral Oral  Resp: 17 16 18 18   Height:       Weight:   78.5 kg (173 lb 1 oz)   SpO2: 99% 100% 100% 99%    Intake/Output Summary (Last 24 hours) at 07/09/15 1449 Last data filed at 07/09/15 1409  Gross per 24 hour  Intake   1942 ml  Output    200 ml  Net   1742 ml   Filed Weights   07/06/15 1958 07/07/15 1021 07/09/15 0500  Weight: 78.2 kg (172 lb 6.4 oz) 78.019 kg (172 lb) 78.5 kg (173 lb 1 oz)    Exam:   General:  Awake, in nad, laying in bed  Cardiovascular: regular, s1, s2  Respiratory: normal resp effort, no wheezing  Abdomen: soft,nondistended, pos BS  Musculoskeletal: perfused, no cyanosis  Data Reviewed: Basic Metabolic Panel:  Recent Labs Lab 07/05/15 1823 07/06/15 0518 07/08/15 0517  NA 139 142 143  K 4.5 5.1 4.4  CL 106 111 114*  CO2 23 22 25   GLUCOSE 113* 102* 107*  BUN 64* 68* 42*  CREATININE 1.99* 1.39* 1.40*  CALCIUM 9.5 8.5* 8.1*   Liver Function Tests:  Recent Labs Lab 07/05/15 1823  AST 36  ALT 36  ALKPHOS 57  BILITOT 0.6  PROT 5.9*  ALBUMIN 3.9   No results for input(s): LIPASE, AMYLASE in the last 168 hours. No results for input(s): AMMONIA in the last 168 hours. CBC:  Recent Labs Lab 07/05/15 2320 07/06/15 0518 07/06/15 1536 07/07/15 0452 07/08/15 0517 07/08/15 1635 07/09/15 0215  WBC 7.6 6.4  --  9.8 6.4  --  7.0  HGB 9.2* 9.3* 10.8* 8.7* 7.0* 9.4* 8.4*  HCT 28.2* 28.4*  --  25.8* 21.5* 28.1* 24.9*  MCV 93.1 91.3  --  89.9 92.3  --  89.6  PLT 224 188  --  194 170  --  143*   Cardiac Enzymes:  Recent Labs Lab 07/08/15 1635 07/08/15 2015 07/09/15 0215  TROPONINI 0.06* 0.04* <0.03   BNP (last 3 results) No results for input(s): BNP in the last 8760 hours.  ProBNP (last 3 results) No results for input(s): PROBNP in the last 8760 hours.  CBG: No results for input(s): GLUCAP in the last 168 hours.  No results found for this or any previous visit (from the past 240 hour(s)).   Studies: No results found.  Scheduled Meds: . aspirin EC  81 mg Oral  Daily  . atorvastatin  80 mg Oral q1800  . pantoprazole (PROTONIX) IV  40 mg Intravenous Q12H  . sodium chloride flush  3 mL Intravenous Q12H   Continuous Infusions: . sodium chloride 75 mL/hr at 07/08/15 1118    Principal Problem:   GI bleed Active Problems:   CAD (coronary artery disease), native coronary artery   Acute blood loss anemia   Symptomatic anemia   Melena   S/P drug eluting coronary stent placement   Upper GI bleed    CHIU, STEPHEN K  [facility] Pager [phone]. If 7PM-7AM, please contact night-coverage at [email or web address], password [facility] 07/09/2015, 2:49 PM  LOS: 3 days

## 2015-07-09 NOTE — Progress Notes (Signed)
25' tel conversation w/ pt's fiance, Gershon Cullriscilla, per pt's request.   Reviewed data to date, possible etiologies, and potential further testing.  She was very appreciative for the time and the detailed explanation.  Florencia Reasonsobert V. Seri Kimmer, M.D. Pager [phone] If no answer or after 5 PM call [phone] .

## 2015-07-09 NOTE — Progress Notes (Signed)
Addendum to earlier progr note:  Stool HEMOCCULT NEGATIVE again today (was heme pos, by his rept, at PCP's office en route to ER 4 days ago, but was heme neg when tested here).  Florencia Reasonsobert V. Yemaya Barnier, M.D. Pager [phone] If no answer or after 5 PM call [phone]

## 2015-07-09 NOTE — Progress Notes (Signed)
GASTROENTEROLOGY PROGRESS NOTE  Problem:   Obscure GIB   Subjective: Numbness left arm and foot last night, responded to O2.  1 BM at 6:30 yest evening--dark.  (This was only his second BM since adm.).  No abd pn.  Pt states that he was heme positive in his PCP's office just prior to this adm before being sent to ER.  Capsule study in progress.  Objective: VS ok. Hgb 8.4, down 1gm overnight.  BUN down from 68 to 42 overnight.  Exam:  NAD, alert, coherent, articulate.  Chest clr, heart nl, abd quiet BS's NT. RECTAL:  Nl prostate, non stool present, scant mucoid residue slt dark--NO MELENA or visible bld.  Assessment: Significant bld loss while on ASA and Brillinta, subacute (w/ presyncope and dark stools 6 days ago, and progressive sx of lightheadedness over next 48hrs), now s/p 4 U prc's (per pt) since adm., to achieve current hgb of 8.4 from baseline of 15.5       I therefore estimate pt has lost a total of approx 11 units of prc's from baseline--yet w/out a proportional amount of observed bld loss (or evid of hemolysis).  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  Plan: 1.  In absence of any observed ulcer on egd, OK from GI standpoint to continue/resume ASA, although since pt is over 6 mos out from DES, would prefer temporarily holding Brillinta if ok w/ Cardiology. 2.  Monitor trend of BUN and hgb to assess for evid of ongoing bld loss 3.  Await results of capsule endoscopy (hopefully, can be read by tomorrow). 4.  Pt advised of addt'l studies to consider, dep on his clin evolution:  ?push enteroscopy, ?double balloon enteroscopy, ?repeat colonoscopy, ?arteriography  Lee Taylor, M.D. 07/09/2015 9:10 AM  Pager [phone] If no answer or after 5 PM call [phone]

## 2015-07-10 LAB — BASIC METABOLIC PANEL
ANION GAP: 6 (ref 5–15)
BUN: 15 mg/dL (ref 6–20)
CALCIUM: 8.1 mg/dL — AB (ref 8.9–10.3)
CO2: 26 mmol/L (ref 22–32)
Chloride: 110 mmol/L (ref 101–111)
Creatinine, Ser: 1.08 mg/dL (ref 0.61–1.24)
GFR calc Af Amer: >60 mL/min (ref >60)
GLUCOSE: 100 mg/dL — AB (ref 65–99)
Potassium: 3.8 mmol/L (ref 3.5–5.1)
SODIUM: 142 mmol/L (ref 135–145)

## 2015-07-10 LAB — PREPARE RBC (CROSSMATCH)

## 2015-07-10 LAB — CBC
HCT: 25.7 % — ABNORMAL LOW (ref 39.0–52.0)
HEMOGLOBIN: 8.4 g/dL — AB (ref 13.0–17.0)
MCH: 29.8 pg (ref 26.0–34.0)
MCHC: 32.7 g/dL (ref 30.0–36.0)
MCV: 91.1 fL (ref 78.0–100.0)
PLATELETS: 155 10*3/uL (ref 150–400)
RBC: 2.82 MIL/uL — ABNORMAL LOW (ref 4.22–5.81)
RDW: 15.8 % — ABNORMAL HIGH (ref 11.5–15.5)
WBC: 5.9 10*3/uL (ref 4.0–10.5)

## 2015-07-10 LAB — HEMOGLOBIN AND HEMATOCRIT, BLOOD
HEMATOCRIT: 29.9 % — AB (ref 39.0–52.0)
HEMOGLOBIN: 10.5 g/dL — AB (ref 13.0–17.0)

## 2015-07-10 MED ORDER — PANTOPRAZOLE SODIUM 40 MG PO TBEC: 40.0000 mg | DELAYED_RELEASE_TABLET | Freq: Every day | ORAL | Status: DC

## 2015-07-10 MED ORDER — SODIUM CHLORIDE 0.9 % IV SOLN
Freq: Once | INTRAVENOUS | Status: AC
  Administered 2015-07-10: 12:00:00 via INTRAVENOUS

## 2015-07-10 NOTE — Care Management Note (Signed)
Case Management Note  Patient Details  Name: Lee Taylor MRN: [id number] Date of Birth: 02/18/1956  Subjective/Objective:    GI bleed                Action/Plan: NCM spoke to pt and girlfriend at bedside. Pt states he currently has [place] with [employer] faxed insurance card to admission office. Pt states he can afford his medications at home.   PCP- Nani Gasseratherine Metheney MD    Expected Discharge Date:  07/10/2015                Expected Discharge Plan:  Home/Self Care  In-House Referral:  NA  Discharge planning Services  CM Consult  Post Acute Care Choice:  NA Choice offered to:  NA  DME Arranged:  N/A DME Agency:  NA  HH Arranged:  NA HH Agency:  NA  Status of Service:  Completed, signed off  Medicare Important Message Given:    Date Medicare IM Given:    Medicare IM give by:    Date Additional Medicare IM Given:    Additional Medicare Important Message give by:     If discussed at Long Length of Stay Meetings, dates discussed:    Additional Comments:  Elliot CousinShavis, Katherine Syme Ellen, RN 07/10/2015, 5:32 PM

## 2015-07-10 NOTE — Addendum Note (Signed)
Addended by: Collie SiadICHARDSON, Adyn Hoes M on: 07/10/2015 11:03 AM   Modules accepted: Orders, SmartSet

## 2015-07-10 NOTE — Progress Notes (Signed)
Patient discharged home with girlfriend. Hemoglobin was 10.5. IV was dc'd and was intact. Patient stated he understood his medications and discharge instructions.

## 2015-07-10 NOTE — Progress Notes (Signed)
[facility] PROGRESS NOTE  Lee Taylor ZOX:[id number] DOB: March 22, 1956 DOA: 07/05/2015 PCP: Nani Gasser, MD  HPI/Brief narrative 60 y.o. gentleman with a history of CAD, NSTEMI last summer, S/P DES to LAD (on aspirin and Brilinta), HTN, and hypercholesterolemia who developed melena 2-3 days ago. He did not think much of the dark-colored stools until he became light-headed, dizzy, with vision disturbance and near syncope today. No loss of consciousness. No chest pain or shortness of breath. No nausea or vomiting. No abdominal pain. No bright red blood per rectum. No prior history of GI bleed.  Assessment/Plan: Symptomatic anemia secondary to melena --Patient given 2 units PRBC's overnight on day of admission --Hold ASA and Brilinta due to acute bleeding with hemodynamically instability --For now, continue Protonix IV --Hgb 7 on 3/5, given 2 units PRBCs. Continue to follow CBC and transfuse as needed --Appreciate GI input. S/p EGD with findings of minimal duodenitis. Capsule study unremarkable --Hgb remains stable overnight at 8.4. Given caridiac history, recommend one unit of prbc's --Discussed with GI. No identifiable source on endoscopy. OK to resume anticoagulation. Discussed with Cardiology, who states may resume now  CAD with DES to LAD --Holding antihypertensives due to relative hypotension --Continue statin --Holding ASA and Brilinta as noted above --Cardiology was consulted in ED, per documentation, low risk for holding anticoagulation at this time.  --Chest pain noted this admission suspect related to worsened anemia. Now pain free after receiving transfusion  Code Status: Full Family Communication: Pt in room Disposition Plan: Unknown at this time   Consultants:  GI  Procedures:    Antibiotics: Anti-infectives    None      HPI/Subjective: Denies chest pain or sob  Objective: Filed Vitals:   07/10/15 1130 07/10/15 1200 07/10/15 1308 07/10/15  1400  BP: 113/62 121/65 122/58 124/73  Pulse: 72 79 81 85  Temp: 98.2 F (36.8 C) 97.8 F (36.6 C) 97.5 F (36.4 C) 97.8 F (36.6 C)  TempSrc: Oral Oral Oral Oral  Resp: Height:      Weight:      SpO2: 99% 99% 99% 100%    Intake/Output Summary (Last 24 hours) at 07/10/15 1444 Last data filed at 07/10/15 1141  Gross per 24 hour  Intake    286 ml  Output    525 ml  Net   -239 ml   Filed Weights   07/06/15 1958 07/07/15 1021 07/09/15 0500  Weight: 78.2 kg (172 lb 6.4 oz) 78.019 kg (172 lb) 78.5 kg (173 lb 1 oz)    Exam:   General:  Awake, in nad, laying in bed  Cardiovascular: regular, s1, s2  Respiratory: normal resp effort, no wheezing  Abdomen: soft,nondistended, pos BS  Musculoskeletal: perfused, no clubbing  Data Reviewed: Basic Metabolic Panel:  Recent Labs Lab 07/05/15 1823 07/06/15 0518 07/08/15 0517 07/10/15 0300  NA 139 142 143 142  K 4.5 5.1 4.4 3.8  CL 106 111 114* 110  CO2 GLUCOSE 113* 102* 107* 100*  BUN 64* 68* 42* 15  CREATININE 1.99* 1.39* 1.40* 1.08  CALCIUM 9.5 8.5* 8.1* 8.1*   Liver Function Tests:  Recent Labs Lab 07/05/15 1823  AST 36  ALT 36  ALKPHOS 57  BILITOT 0.6  PROT 5.9*  ALBUMIN 3.9   No results for input(s): LIPASE, AMYLASE in the last 168 hours. No results for input(s): AMMONIA in the last 168 hours. CBC:  Recent Labs Lab 07/06/15 0518  07/07/15 0452 07/08/15  [id number] 07/08/15 1635 07/09/15 0215 07/10/15 0300  WBC 6.4  --  9.8 6.4  --  7.0 5.9  HGB 9.3*  < > 8.7* 7.0* 9.4* 8.4* 8.4*  HCT 28.4*  --  25.8* 21.5* 28.1* 24.9* 25.7*  MCV 91.3  --  89.9 92.3  --  89.6 91.1  PLT 188  --  194 170  --  143* 155  < > = values in this interval not displayed. Cardiac Enzymes:  Recent Labs Lab 07/08/15 1635 07/08/15 2015 07/09/15 0215  TROPONINI 0.06* 0.04* <0.03   BNP (last 3 results) No results for input(s): BNP in the last 8760 hours.  ProBNP (last 3 results) No results for  input(s): PROBNP in the last 8760 hours.  CBG: No results for input(s): GLUCAP in the last 168 hours.  No results found for this or any previous visit (from the past 240 hour(s)).   Studies: No results found.  Scheduled Meds: . aspirin EC  81 mg Oral Daily  . atorvastatin  80 mg Oral q1800  . pantoprazole (PROTONIX) IV  40 mg Intravenous Q12H  . sodium chloride flush  3 mL Intravenous Q12H   Continuous Infusions: . sodium chloride 75 mL/hr at 07/10/15 0247    Principal Problem:   GI bleed Active Problems:   CAD (coronary artery disease), native coronary artery   Acute blood loss anemia   Symptomatic anemia   Melena   S/P drug eluting coronary stent placement   Upper GI bleed    CHIU, STEPHEN K  [facility] Pager [phone]. If 7PM-7AM, please contact night-coverage at [email or web address], password [email or web address] 07/10/2015, 2:44 PM  LOS: 4 days

## 2015-07-10 NOTE — Discharge Summary (Signed)
Physician Discharge Summary  Lee Manifoldhomas Ates [id number] DOB: 09/07/1955 DOA: 07/05/2015  PCP: Nani GasserMETHENEY,CATHERINE, MD  Admit date: 07/05/2015 Discharge date: 07/10/2015  Time spent: 20 minutes  Recommendations for Outpatient Follow-up:  1. Follow up with PCP in 2-3 weeks 2. Follow up with primary GI 3. Please monitor BP and would resume lisinopril as BP tolerates  Discharge Diagnoses:  Principal Problem:   GI bleed Active Problems:   CAD (coronary artery disease), native coronary artery   Acute blood loss anemia   Symptomatic anemia   Melena   S/P drug eluting coronary stent placement   Upper GI bleed   Discharge Condition: Stable  Diet recommendation: heart healthy  Filed Weights   07/06/15 1958 07/07/15 1021 07/09/15 0500  Weight: 78.2 kg (172 lb 6.4 oz) 78.019 kg (172 lb) 78.5 kg (173 lb 1 oz)    History of present illness:  Please review dictated H and P from 3/2 for details. Briefly, [age] y.o. gentleman with a history of CAD, NSTEMI last summer, S/P DES to LAD (on aspirin and Brilinta), HTN, and hypercholesterolemia who developed melena 2-3 days ago. He did not think much of the dark-colored stools until he became light-headed, dizzy, with vision disturbance and near syncope today. No loss of consciousness. No chest pain or shortness of breath. No nausea or vomiting. No abdominal pain. No bright red blood per rectum. No prior history of GI bleed.  Hospital Course:  Symptomatic anemia secondary to melena --Patient given 2 units PRBC's overnight on day of admission --Initially held ASA and Brilinta due to acute bleeding with hemodynamically instability --initially continued Protonix IV --Hgb 7 on 3/5, given 2 units PRBCs. --Appreciate GI input. S/p EGD with findings of minimal duodenitis. Capsule study unremarkable --Hgb remained stable at 8.4. Given caridiac history, recommend one more unit of prbc's --Discussed with GI. No identifiable source on endoscopy. OK to  resume anticoagulation and follow up with primary GI regarding updated colonoscopy, CT of abdomen, mesenteric angiography, push enteroscopy, or double-balloon enteroscopy. Discussed with Cardiology, who states may resume anticoagulation now  CAD with DES to LAD --Helding antihypertensives due to relative hypotension --Continued statin --Initially held ASA and Brilinta as noted above --Chest pain noted this admission suspect related to worsened anemia. Now pain free after receiving transfusion --On discharge, recommend resuming beta blocker. ACEI remains on hold given soft BP but would eventually resume as BP tolerates.  Procedures:  EGD  Capsule Endoscopy  Consultations:  GI  Discussed case with Cardiology over phone  Discharge Exam: Filed Vitals:   07/10/15 1130 07/10/15 1200 07/10/15 1308 07/10/15 1400  BP: 113/62 121/65 122/58 124/73  Pulse: 72 79 81 85  Temp: 98.2 F (36.8 C) 97.8 F (36.6 C) 97.5 F (36.4 C) 97.8 F (36.6 C)  TempSrc: Oral Oral Oral Oral  Resp: 16 16 18 18   Height:      Weight:      SpO2: 99% 99% 99% 100%    General: awake, in nad Cardiovascular: regular, s1, s2 Respiratory: normal resp effort, no wheezing  Discharge Instructions     Medication List    STOP taking these medications        lisinopril 20 MG tablet  Commonly known as:  PRINIVIL,ZESTRIL      TAKE these medications        aspirin 81 MG tablet  Take 81 mg by mouth daily.     atorvastatin 80 MG tablet  Commonly known as:  LIPITOR  Take 1 tablet (80 mg  total) by mouth daily at 6 PM.     Calcium Carb-Cholecalciferol 600-800 MG-UNIT Tabs  Take 1 tablet by mouth daily.     Ginkgo Biloba 40 MG Tabs  Take 60 mg by mouth daily.     isosorbide mononitrate 30 MG 24 hr tablet  Commonly known as:  IMDUR  Take 1 tablet (30 mg total) by mouth daily.     metoprolol tartrate 25 MG tablet  Commonly known as:  LOPRESSOR  Take 0.5 tablets (12.5 mg total) by mouth 2 (two) times  daily.     multivitamin capsule  Take 1 capsule by mouth daily.     nitroGLYCERIN 0.4 MG SL tablet  Commonly known as:  NITROSTAT  Place 1 tablet (0.4 mg total) under the tongue every 5 (five) minutes as needed for chest pain.     pantoprazole 40 MG tablet  Commonly known as:  PROTONIX  Take 1 tablet (40 mg total) by mouth daily.     polycarbophil 625 MG tablet  Commonly known as:  FIBERCON  Take 625 mg by mouth daily.     ticagrelor 90 MG Tabs tablet  Commonly known as:  BRILINTA  Take 1 tablet (90 mg total) by mouth 2 (two) times daily.       Allergies  Allergen Reactions  . Amoxicillin Hives      The results of significant diagnostics from this hospitalization (including imaging, microbiology, ancillary and laboratory) are listed below for reference.    Significant Diagnostic Studies: No results found.  Microbiology: No results found for this or any previous visit (from the past 240 hour(s)).   Labs: Basic Metabolic Panel:  Recent Labs Lab 07/05/15 1823 07/06/15 0518 07/08/15 0517 07/10/15 0300  NA 139 142 143 142  K 4.5 5.1 4.4 3.8  CL 106 111 114* 110  CO2 GLUCOSE 113* 102* 107* 100*  BUN 64* 68* 42* 15  CREATININE 1.99* 1.39* 1.40* 1.08  CALCIUM 9.5 8.5* 8.1* 8.1*   Liver Function Tests:  Recent Labs Lab 07/05/15 1823  AST 36  ALT 36  ALKPHOS 57  BILITOT 0.6  PROT 5.9*  ALBUMIN 3.9   No results for input(s): LIPASE, AMYLASE in the last 168 hours. No results for input(s): AMMONIA in the last 168 hours. CBC:  Recent Labs Lab 07/06/15 0518  07/07/15 0452 07/08/15 0517 07/08/15 1635 07/09/15 0215 07/10/15 0300  WBC 6.4  --  9.8 6.4  --  7.0 5.9  HGB 9.3*  < > 8.7* 7.0* 9.4* 8.4* 8.4*  HCT 28.4*  --  25.8* 21.5* 28.1* 24.9* 25.7*  MCV 91.3  --  89.9 92.3  --  89.6 91.1  PLT 188  --  194 170  --  143* 155  < > = values in this interval not displayed. Cardiac Enzymes:  Recent Labs Lab 07/08/15 1635 07/08/15 2015  07/09/15 0215  TROPONINI 0.06* 0.04* <0.03   BNP: BNP (last 3 results) No results for input(s): BNP in the last 8760 hours.  ProBNP (last 3 results) No results for input(s): PROBNP in the last 8760 hours.  CBG: No results for input(s): GLUCAP in the last 168 hours.   Signed:  Klye Taylor K  [facility] 07/10/2015, 3:07 PM

## 2015-07-10 NOTE — Progress Notes (Signed)
GASTROENTEROLOGY PROGRESS NOTE  Problem:   Obscure GI bleeding. Acute posthemorrhagic anemia.  Subjective: No BM's, feels ready to go home.  Transfusion in progress.  Objective: Hgb stable at 8.4 BUN normalized at 15.  Capsule endoscopy study NEGATIVE.  Assessment: Suspect patient had a small, evanescent aspirin-induced ulceration which had healed, or was not seen, on recent EGD.  Alternatively, patient may have had a Dieulafoy's lesion.  Plan:  OK for dischg from GI standpoint any time (likely tomorrow, possibly late today).  Recommend:   1.  decision about whether/when to restart ASA and/or Brillinta per Cardiology, based on their estimation of risk and benefit.   Given the absence of any defined lesion to account for bleeding, no objection from GI standpoint to resumption of medications.  One option would be restarting medication after a brief holiday (1-2 weeks) and perhaps in a stepwise fashion (e.g., could start ASA first; then, if no bleeding in the first month or so of ASA use, add Brillinta back in, if both medicines are felt to be important for the patient).  2.  Because there is always the possibility of a small, unseen peptic ulcer as the cause of this patient's recent bleed, I would favor a minimum of 1 month of PPI therapy.  Moreover, if the patient is going to go back on aspirin, I would favor PPI prophylaxis indefinitely, as long as he remains on aspirin or any other ulcerogenic medication.  3.  Given documented absence of ongoing bleeding at this time, I would not be inclined to do further workup on this admission.    4. Decision about further GI workup could be at the discretion of the patient's primary GI in [place].  Options include (1) observation, reserving further workup for recurrent bleeding despite PPI therapy; or alternatively, (2) doing further diagnostic testing electively (such as updated colonoscopy, CT of abdomen, mesenteric angiography, push  enteroscopy, or double-balloon enteroscopy).  I have recommended that the patient make a f/u appt w/ his GI to discuss this.  5. Will sign off, please call me prn.  Above discussed w/ pt and his fiance Gershon Cullriscilla for about 20 minutes, and also w/ Dr. Rhona Leavenshiu of Hospitalist service.  Florencia Reasonsobert V. Jerney Baksh, M.D. 07/10/2015 10:42 AM  Pager [phone] If no answer or after 5 PM call [phone]

## 2015-07-11 LAB — TYPE AND SCREEN
ABO/RH(D): A NEG
ANTIBODY SCREEN: NEGATIVE
Unit division: 0

## 2015-07-11 LAB — HAPTOGLOBIN: Haptoglobin: 113 mg/dL (ref 34–200)

## 2015-07-12 ENCOUNTER — Ambulatory Visit (INDEPENDENT_AMBULATORY_CARE_PROVIDER_SITE_OTHER): Payer: BLUE CROSS/BLUE SHIELD | Admitting: Family Medicine

## 2015-07-12 ENCOUNTER — Encounter: Payer: Self-pay | Admitting: Family Medicine

## 2015-07-12 VITALS — BP 110/60 | HR 77 | Wt 177.0 lb

## 2015-07-12 DIAGNOSIS — K922 Gastrointestinal hemorrhage, unspecified: Secondary | ICD-10-CM

## 2015-07-12 DIAGNOSIS — Z955 Presence of coronary angioplasty implant and graft: Secondary | ICD-10-CM | POA: Diagnosis not present

## 2015-07-12 DIAGNOSIS — I2510 Atherosclerotic heart disease of native coronary artery without angina pectoris: Secondary | ICD-10-CM

## 2015-07-12 MED ORDER — OMEPRAZOLE 40 MG PO CPDR: 40.0000 mg | DELAYED_RELEASE_CAPSULE | Freq: Every day | ORAL | Status: DC

## 2015-07-12 NOTE — Progress Notes (Signed)
   Subjective:    Patient ID: Lee Taylor, male    DOB: 09/05/1955, 60 y.o.   MRN: [id number]  HPI  Lee Taylor is a 60 year old male with a history of coronary artery disease with recent stenting he was on Brilinta who started to experience a GI bleed. He was admitted to the hospital on March 2 and discharged home on March 7. He was transfused 2 units of packed red blood cells in the initially held his aspirin and Brilinta. They started him on IV Protonix. His hemoglobin was 7 several days later and he was transfused 2 more units. He then received 1 more unit after hemoglobin remained stable around 8.4. Recommended that he follow-up with GI for up-to-date colonoscopy, CT of abdomen. He did have a capsule endoscopy which was essentially a normal study. They suspected a transient ulceration of the upper tract possibly a Dieulafoy's lesion.  D/C hgb was 10.5.    Says he is having  A hard time focusing and gets really fatigued with walking.  No numbness or tingling as he experienced during his hospitalization. His wife is here with him today.  Review of Systems     Objective:   Physical Exam  Constitutional: He is oriented to person, place, and time. He appears well-developed and well-nourished.  HENT:  Head: Normocephalic and atraumatic.  Cardiovascular: Normal rate, regular rhythm and normal heart sounds.   Pulmonary/Chest: Effort normal and breath sounds normal.  Abdominal: Soft. Bowel sounds are normal. He exhibits no distension and no mass. There is no tenderness. There is no rebound and no guarding.  Neurological: He is alert and oriented to person, place, and time.  Skin: Skin is warm and dry.  Nailbeds are pale. Conjunctiva is pale.   Psychiatric: He has a normal mood and affect. His behavior is normal.          Assessment & Plan:  GI bleed on anticoagulants-we'll recheck CBC today and make sure hemoglobin is stable. He still has pale nail beds and pale conjunctiva. Explained it can  take some time to rebuild the hemoglobin levels. I'm going to go ahead and write him out for the next 2 weeks. At that point I think we'll have a better sense of when he is able to return to work. He has a very physically active job. His follow-up with cardiology next week and they are planning on possible CT for further evaluation.  Coronary artery disease-we'll continue Brilinta with a PPI on board. He is also on a beta blocker and statin. He did have some chest pain during hospitalization and had a mild bump in enzymes but has not had any more chest pain since being home. Hopefully he will be able to continue the Brilinta for at least 3 more months for a full 12 month course.

## 2015-07-13 LAB — CBC WITH DIFFERENTIAL/PLATELET
BASOS ABS: 0 10*3/uL (ref 0.0–0.1)
BASOS PCT: 0 % (ref 0–1)
EOS ABS: 0.1 10*3/uL (ref 0.0–0.7)
EOS PCT: 1 % (ref 0–5)
HCT: 33.8 % — ABNORMAL LOW (ref 39.0–52.0)
Hemoglobin: 11 g/dL — ABNORMAL LOW (ref 13.0–17.0)
LYMPHS PCT: 11 % — AB (ref 12–46)
Lymphs Abs: 1.2 10*3/uL (ref 0.7–4.0)
MCH: 30.1 pg (ref 26.0–34.0)
MCHC: 32.5 g/dL (ref 30.0–36.0)
MCV: 92.6 fL (ref 78.0–100.0)
MPV: 10.1 fL (ref 8.6–12.4)
Monocytes Absolute: 0.6 10*3/uL (ref 0.1–1.0)
Monocytes Relative: 6 % (ref 3–12)
Neutro Abs: 8.8 10*3/uL — ABNORMAL HIGH (ref 1.7–7.7)
Neutrophils Relative %: 82 % — ABNORMAL HIGH (ref 43–77)
PLATELETS: 313 10*3/uL (ref 150–400)
RBC: 3.65 MIL/uL — AB (ref 4.22–5.81)
RDW: 16 % — AB (ref 11.5–15.5)
WBC: 10.7 10*3/uL — AB (ref 4.0–10.5)

## 2015-07-13 LAB — BASIC METABOLIC PANEL WITH GFR
BUN: 16 mg/dL (ref 7–25)
CALCIUM: 8.6 mg/dL (ref 8.6–10.3)
CO2: 26 mmol/L (ref 20–31)
Chloride: 106 mmol/L (ref 98–110)
Creat: 1.22 mg/dL (ref 0.70–1.33)
GFR, EST AFRICAN AMERICAN: 75 mL/min (ref >=60)
GFR, EST NON AFRICAN AMERICAN: 64 mL/min (ref >=60)
Glucose, Bld: 122 mg/dL — ABNORMAL HIGH (ref 65–99)
POTASSIUM: 4 mmol/L (ref 3.5–5.3)
SODIUM: 143 mmol/L (ref 135–146)

## 2015-07-25 ENCOUNTER — Ambulatory Visit: Payer: BLUE CROSS/BLUE SHIELD | Admitting: Family Medicine

## 2015-07-26 ENCOUNTER — Ambulatory Visit (INDEPENDENT_AMBULATORY_CARE_PROVIDER_SITE_OTHER): Payer: BLUE CROSS/BLUE SHIELD | Admitting: Family Medicine

## 2015-07-26 ENCOUNTER — Encounter: Payer: Self-pay | Admitting: Family Medicine

## 2015-07-26 ENCOUNTER — Telehealth: Payer: Self-pay | Admitting: *Deleted

## 2015-07-26 VITALS — BP 129/73 | HR 82 | Wt 174.0 lb

## 2015-07-26 DIAGNOSIS — I2510 Atherosclerotic heart disease of native coronary artery without angina pectoris: Secondary | ICD-10-CM | POA: Diagnosis not present

## 2015-07-26 DIAGNOSIS — D62 Acute posthemorrhagic anemia: Secondary | ICD-10-CM

## 2015-07-26 DIAGNOSIS — K254 Chronic or unspecified gastric ulcer with hemorrhage: Secondary | ICD-10-CM | POA: Diagnosis not present

## 2015-07-26 DIAGNOSIS — K253 Acute gastric ulcer without hemorrhage or perforation: Secondary | ICD-10-CM

## 2015-07-26 NOTE — Progress Notes (Signed)
   Subjective:    Patient ID: Lee Taylor, male    DOB: 04/12/1956, 60 y.o.   MRN: [id number]  HPI He is here today for follow-up GI bleed. I saw him about 2 weeks ago and he was doing better. He seemed blood was up to 11. Unfortunately about 3 or 4 days after I saw him he started to have a bleed again. He ended up going to the hospital and when he was admitted his hemoglobin was around 7. They were able to scope him and actually find a bleeding ulcer this time that was gastric per his report. He says at discharge his hemoglobin was 9.2. He does need some additional lab work drawn today as recommended by his gastroenterologist. He will see him again in April. He is still on the Brilinta and they have no increased his Protonix to twice a day dosing. He will need a work note.   Review of Systems     Objective:   Physical Exam  Constitutional: He is oriented to person, place, and time. He appears well-developed and well-nourished.  HENT:  Head: Normocephalic and atraumatic.  Cardiovascular: Normal rate, regular rhythm and normal heart sounds.   Pulmonary/Chest: Effort normal and breath sounds normal.  Abdominal: Soft. Bowel sounds are normal. He exhibits no distension and no mass. There is no tenderness. There is no rebound and no guarding.  Neurological: He is alert and oriented to person, place, and time.  Skin: Skin is warm and dry.  Psychiatric: He has a normal mood and affect. His behavior is normal.          Assessment & Plan:  GI bleed secondary to gastric ulcer-evidently they did a banding to stop the bleeding. He follows up in one month with GI again. He is now on twice a day dosing of his PPI. Work note given for an additional 3 weeks out of work.  Acute blood loss anemia-we'll recheck CBC today. Also check ferritin and iron and TIBC.  Coronary artery disease-status post catheterization in June. He will need to stay on Brilinta until June.

## 2015-07-26 NOTE — Telephone Encounter (Signed)
Work note faxed. Confirmation received, copy given to pt for records.Lee PacasBarkley, Lee Saulsbury KalamaLynetta

## 2015-07-27 LAB — CBC WITH DIFFERENTIAL/PLATELET
BASOS ABS: 0 10*3/uL (ref 0.0–0.1)
Basophils Relative: 0 % (ref 0–1)
EOS ABS: 0.2 10*3/uL (ref 0.0–0.7)
Eosinophils Relative: 3 % (ref 0–5)
HEMATOCRIT: 35 % — AB (ref 39.0–52.0)
Hemoglobin: 11.2 g/dL — ABNORMAL LOW (ref 13.0–17.0)
LYMPHS ABS: 1.1 10*3/uL (ref 0.7–4.0)
LYMPHS PCT: 18 % (ref 12–46)
MCH: 29.2 pg (ref 26.0–34.0)
MCHC: 32 g/dL (ref 30.0–36.0)
MCV: 91.1 fL (ref 78.0–100.0)
MPV: 9.6 fL (ref 8.6–12.4)
Monocytes Absolute: 0.7 10*3/uL (ref 0.1–1.0)
Monocytes Relative: 11 % (ref 3–12)
NEUTROS PCT: 68 % (ref 43–77)
Neutro Abs: 4.2 10*3/uL (ref 1.7–7.7)
PLATELETS: 312 10*3/uL (ref 150–400)
RBC: 3.84 MIL/uL — ABNORMAL LOW (ref 4.22–5.81)
RDW: 15.3 % (ref 11.5–15.5)
WBC: 6.2 10*3/uL (ref 4.0–10.5)

## 2015-07-27 LAB — IRON AND TIBC
%SAT: 5 % — ABNORMAL LOW (ref 15–60)
IRON: 18 ug/dL — AB (ref 50–180)
TIBC: 333 ug/dL (ref 250–425)
UIBC: 315 ug/dL (ref 125–400)

## 2015-07-27 LAB — FERRITIN: Ferritin: 32 ng/mL (ref 20–380)

## 2015-07-27 LAB — VITAMIN B12: Vitamin B-12: 707 pg/mL (ref 200–1100)

## 2015-07-27 LAB — FOLATE: Folate: >24 ng/mL (ref >5.4)

## 2015-08-14 ENCOUNTER — Encounter: Payer: Self-pay | Admitting: *Deleted

## 2015-08-14 ENCOUNTER — Encounter: Payer: Self-pay | Admitting: Family Medicine

## 2015-08-14 ENCOUNTER — Telehealth: Payer: Self-pay | Admitting: *Deleted

## 2015-08-14 NOTE — Telephone Encounter (Signed)
Pt's wife called and called and lvm stating that they needed the letter that was given the last time he was here and that he will need a note releasing him to go back to work with restrictions.Lee PacasBarkley, Lee Goyal [name]

## 2015-08-14 NOTE — Telephone Encounter (Signed)
Spoke w/pt's wife she asked that I write a letter stating that he works 8 hours a day until he is re-evaluated in July. Letter will be placed up front for p/u .Loralee PacasBarkley, Tynell Winchell [place]Lynetta

## 2015-08-14 NOTE — Telephone Encounter (Signed)
Pt's work note faxed, confirmation received, copy placed up front for p/u.Loralee PacasBarkley, Davarius Ridener HarpsterLynetta

## 2015-08-15 ENCOUNTER — Other Ambulatory Visit: Payer: Self-pay

## 2015-08-15 DIAGNOSIS — D509 Iron deficiency anemia, unspecified: Secondary | ICD-10-CM | POA: Diagnosis not present

## 2015-08-15 LAB — HEMOGLOBIN: HEMOGLOBIN: 12.3 g/dL — AB (ref 13.2–17.1)

## 2015-08-15 LAB — IRON AND TIBC
%SAT: 53 % (ref 15–60)
Iron: 199 ug/dL — ABNORMAL HIGH (ref 50–180)
TIBC: 376 ug/dL (ref 250–425)
UIBC: 177 ug/dL (ref 125–400)

## 2015-08-15 LAB — FERRITIN: Ferritin: 19 ng/mL — ABNORMAL LOW (ref 20–380)

## 2015-08-21 NOTE — Addendum Note (Signed)
Addended by: Deno EtienneBARKLEY, Amiel Sharrow L on: 08/21/2015 07:48 AM   Modules accepted: Orders

## 2015-08-23 ENCOUNTER — Ambulatory Visit: Payer: BLUE CROSS/BLUE SHIELD | Admitting: Family Medicine

## 2015-09-28 DIAGNOSIS — K279 Peptic ulcer, site unspecified, unspecified as acute or chronic, without hemorrhage or perforation: Secondary | ICD-10-CM | POA: Diagnosis not present

## 2015-10-21 ENCOUNTER — Other Ambulatory Visit: Payer: Self-pay | Admitting: Physician Assistant

## 2015-10-22 NOTE — Telephone Encounter (Signed)
Rx(s) sent to pharmacy electronically.  

## 2015-10-28 ENCOUNTER — Other Ambulatory Visit: Payer: Self-pay | Admitting: Physician Assistant

## 2015-10-29 NOTE — Telephone Encounter (Signed)
Rx request sent to pharmacy.  

## 2015-11-15 ENCOUNTER — Ambulatory Visit: Payer: BLUE CROSS/BLUE SHIELD | Admitting: Family Medicine

## 2015-11-21 ENCOUNTER — Encounter: Payer: Self-pay | Admitting: Cardiology

## 2015-11-21 ENCOUNTER — Ambulatory Visit (INDEPENDENT_AMBULATORY_CARE_PROVIDER_SITE_OTHER): Payer: 59 | Admitting: Cardiology

## 2015-11-21 VITALS — BP 137/88 | HR 70 | Ht 69.0 in | Wt 184.1 lb

## 2015-11-21 DIAGNOSIS — E785 Hyperlipidemia, unspecified: Secondary | ICD-10-CM

## 2015-11-21 DIAGNOSIS — I2510 Atherosclerotic heart disease of native coronary artery without angina pectoris: Secondary | ICD-10-CM | POA: Diagnosis not present

## 2015-11-21 DIAGNOSIS — I10 Essential (primary) hypertension: Secondary | ICD-10-CM

## 2015-11-21 DIAGNOSIS — I255 Ischemic cardiomyopathy: Secondary | ICD-10-CM | POA: Diagnosis not present

## 2015-11-21 MED ORDER — LISINOPRIL 10 MG PO TABS: 10.0000 mg | ORAL_TABLET | Freq: Every day | ORAL | Status: DC

## 2015-11-21 NOTE — Patient Instructions (Addendum)
Medication Instructions:   STOP BRILINTA  STOP ISOSORBIDE  START LISINOPRIL 10 MG ONCE DAILY  Labwork:  Your physician recommends that you return for lab work TOMORROW   Follow-Up:  Your physician wants you to follow-up in: ONE YEAR WITH DR Shelda PalRENSHAW  You will receive a reminder letter in the mail two months in advance. If you don't receive a letter, please call our office to schedule the follow-up appointment.   If you need a refill on your cardiac medications before your next appointment, please call your pharmacy.

## 2015-11-21 NOTE — Progress Notes (Signed)
HPI: FU CAD; admitted 6/16 with NSTEMI; had PCI of LAD with DES. Echo 6/16 showed EF 40-45, grade 1 diastolic dysfunction. Admitted with recurrent CP and repeat cath 11/01/14 showed 20 LAD, distal 70 LAD, 20 RCA, normal LV function; medical therapy recommended. Readmitted 7/16 with CP and ruled out. Patient has had GI bleed in March 2017. Endoscopy encouraged film showed an ulcer. Since last seen, the patient has dyspnea with more extreme activities but not with routine activities. It is relieved with rest. It is not associated with chest pain. There is no orthopnea, PND or pedal edema. There is no syncope or palpitations. There is no exertional chest pain.   Current Outpatient Prescriptions  Medication Sig Dispense Refill  . aspirin 81 MG tablet Take 81 mg by mouth daily.      [name]. atorvastatin (LIPITOR) 80 MG tablet TAKE ONE TABLET BY MOUTH ONCE DAILY AT SIX IN THE EVENING 30 tablet 6  . Calcium Carb-Cholecalciferol 600-800 MG-UNIT TABS Take 1 tablet by mouth daily.     . Ginkgo Biloba 40 MG TABS Take 60 mg by mouth daily.     . IRON PO Take 1 tablet by mouth daily.    . isosorbide mononitrate (IMDUR) 30 MG 24 hr tablet TAKE ONE TABLET BY MOUTH ONCE DAILY 30 tablet 2  . metoprolol tartrate (LOPRESSOR) 25 MG tablet Take 0.5 tablets (12.5 mg total) by mouth 2 (two) times daily. 90 tablet 1  . Multiple Vitamin (MULTIVITAMIN) capsule Take 1 capsule by mouth daily.      . nitroGLYCERIN (NITROSTAT) 0.4 MG SL tablet Place 1 tablet (0.4 mg total) under the tongue every 5 (five) minutes as needed for chest pain. 25 tablet 3  . polycarbophil (FIBERCON) 625 MG tablet Take 625 mg by mouth daily.    . ticagrelor (BRILINTA) 90 MG TABS tablet Take 1 tablet (90 mg total) by mouth 2 (two) times daily. 60 tablet 11   No current facility-administered medications for this visit.     Past Medical History  Diagnosis Date  . Hypertension   . Coronary artery disease 10/2014    a. cath 10/23/2014 2.5 x 20  Synergy DES to the LAD, EF 40-45 percent   b. relook cath 6/29, patent stents, medical management  . GERD (gastroesophageal reflux disease)   . Hypercholesterolemia   . NSTEMI (non-ST elevated myocardial infarction) (HCC) 10/20/2014    2.5 x 20 Synergy DES to the LAD, EF 40-45 percent  . Kidney stones     "passed it"  . Acute kidney injury (HCC) 10/23/2014    "related to the MI"    Past Surgical History  Procedure Laterality Date  . Tonsillectomy    . Tympanoplasty Left 1960's    "perforated eardrum"  . Coronary angioplasty with stent placement  10/23/2014    2.5 x 20 Synergy DES to the LAD  . Cardiac catheterization N/A 10/23/2014    Procedure: Left Heart Cath and Coronary Angiography;  Surgeon: Corky CraftsJayadeep S Varanasi, MD; LAD 95%, thrombotic, small ramus intermedius, CFX and OM branch is patent, RCA 20%     . Cardiac catheterization N/A 10/23/2014    Procedure: Coronary Stent Intervention;  Surgeon: Corky CraftsJayadeep S Varanasi, MD; 2.5 x 20 Synergy DES to the LAD    . Cardiac catheterization  11/01/2014  . Cardiac catheterization N/A 11/01/2014    Procedure: Left Heart Cath and Coronary Angiography;  Surgeon: Lennette Biharihomas A Kelly, MD;  [facility];  Service: Cardiovascular;  Laterality: N/A;  . Esophagogastroduodenoscopy N/A 07/07/2015    Procedure: ESOPHAGOGASTRODUODENOSCOPY (EGD);  Surgeon: Charlott Rakes, MD;  Location: [facility] ENDOSCOPY;  Service: Endoscopy;  Laterality: N/A;  . Givens capsule study N/A 07/09/2015    Procedure: GIVENS CAPSULE STUDY;  Surgeon: Charlott Rakes, MD;  Location: [facility] ENDOSCOPY;  Service: Endoscopy;  Laterality: N/A;    Social History   Social History  . Marital Status: Married    Spouse Name: [name]   . Number of Children: 2  . Years of Education: N/A   Occupational History  . Museum/gallery exhibitions officer    Social History Main Topics  . Smoking status: Never Smoker   . Smokeless tobacco: Never Used  . Alcohol Use: Yes     Comment: 11/01/2014 "maybe a  beer q 2 wks"  . Drug Use: No  . Sexual Activity:    Partners: Female   Other Topics Concern  . Not on file   Social History Narrative    Family History  Problem Relation Age of Onset  . Heart failure Mother   . Heart attack Sister 56    smoker    . Heart disease Mother     triple bypass   . Stroke Father     ROS: Some fatigue but no fevers or chills, productive cough, hemoptysis, dysphasia, odynophagia, melena, hematochezia, dysuria, hematuria, rash, seizure activity, orthopnea, PND, pedal edema, claudication. Remaining systems are negative.  Physical Exam: Well-developed well-nourished in no acute distress.  Skin is warm and dry.  HEENT is normal.  Neck is supple.  Chest is clear to auscultation with normal expansion.  Cardiovascular exam is regular rate and rhythm.  Abdominal exam nontender or distended. No masses palpated. Extremities show no edema. neuro grossly intact  A/P  1 Coronary artery disease-continue aspirin and statin. Discontinue brilinta as it has been over one year since his previous PCI.  2 Hyperlipidemia-continue statin. Check lipids and liver.  3 hypertension-blood pressure controlled. Continue present medications.  4 ischemic cardiomyopathy-his ACE inhibitor apparently was discontinued during his hospitalization for GI bleed. I will discontinue isosorbide and instead treat with lisinopril 10 mg daily. Check potassium and renal function in 1 week. His LV function was normal on follow-up catheterization.  Olga Millers, MD

## 2015-11-25 ENCOUNTER — Other Ambulatory Visit: Payer: Self-pay | Admitting: Family Medicine

## 2015-11-25 DIAGNOSIS — I10 Essential (primary) hypertension: Secondary | ICD-10-CM

## 2015-11-26 ENCOUNTER — Other Ambulatory Visit: Payer: Self-pay | Admitting: Family Medicine

## 2015-11-26 ENCOUNTER — Encounter: Payer: Self-pay | Admitting: Family Medicine

## 2015-11-26 ENCOUNTER — Ambulatory Visit (INDEPENDENT_AMBULATORY_CARE_PROVIDER_SITE_OTHER): Payer: 59 | Admitting: Family Medicine

## 2015-11-26 VITALS — BP 94/54 | HR 62 | Ht 70.0 in | Wt 181.0 lb

## 2015-11-26 DIAGNOSIS — Z23 Encounter for immunization: Secondary | ICD-10-CM

## 2015-11-26 DIAGNOSIS — R5383 Other fatigue: Secondary | ICD-10-CM | POA: Diagnosis not present

## 2015-11-26 DIAGNOSIS — I420 Dilated cardiomyopathy: Secondary | ICD-10-CM

## 2015-11-26 DIAGNOSIS — I10 Essential (primary) hypertension: Secondary | ICD-10-CM | POA: Diagnosis not present

## 2015-11-26 NOTE — Patient Instructions (Signed)
Cut lisinopril in half and follow-up in 2 weeks for a blood pressure check with the nurse.

## 2015-11-26 NOTE — Progress Notes (Signed)
Subjective:    CC:   HPI:  Hypertension- Pt denies chest pain, SOB, dizziness, or heart palpitations.  Taking meds as directed w/o problems.  Denies medication side effects.  His diet isosorbide was discontinued and he was started back on lisinopril 10 mg.  He has felt fatigued particularly at the end of the day. He did have some blood work drawn earlier today. Prior hx of GI bleed. He is still on iron supplementation.  He denies any lower extremity swelling or problems. 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 He says it's okay but makes less money.   Past medical history, Surgical history, Family history not pertinant except as noted below, Social history, Allergies, and medications have been entered into the medical record, reviewed, and corrections made.   Review of Systems: No fevers, chills, night sweats, weight loss, chest pain, or shortness of breath.   Objective:    General: Well Developed, well nourished, and in no acute distress.  Neuro: Alert and oriented x3, extra-ocular muscles intact, sensation grossly intact.  HEENT: Normocephalic, atraumatic, no cervical LNs.   Skin: Warm and dry, no rashes. Cardiac: Regular rate and rhythm, no murmurs rubs or gallops, no lower extremity edema.  Respiratory: Clear to auscultation bilaterally. Not using accessory muscles, speaking in full sentences.   Impression and Recommendations:   Hypertension-he was recently started on 10 mg lisinopril her blood pressure sexual bit low today. We'll have him cut that in half and then come back for a nurse blood pressure check in 2 weeks.  Due for Pneumovax 23. Handout for shingles given the to check with insurance to see if it's covered.  Fatigue - will add TSH tomorrow.  Continue iron supplementation for iron deficiency anemia secondary to GI bleed.  Dilated cardiomyopathy-back on an ACE inhibitor. Doing well  except blood pressures actually a little low today. Decrease lisinopril down to half of a tab in follow-up in 2 weeks for repeat blood pressure check.

## 2015-11-27 ENCOUNTER — Encounter: Payer: Self-pay | Admitting: Family Medicine

## 2015-11-27 DIAGNOSIS — N183 Chronic kidney disease, stage 3 unspecified: Secondary | ICD-10-CM | POA: Insufficient documentation

## 2015-11-27 LAB — HEMOGLOBIN: HEMOGLOBIN: 14.7 g/dL (ref 13.2–17.1)

## 2015-11-27 LAB — HEPATIC FUNCTION PANEL
ALT: 29 U/L (ref 9–46)
AST: 27 U/L (ref 10–35)
Albumin: 4 g/dL (ref 3.6–5.1)
Alkaline Phosphatase: 73 U/L (ref 40–115)
Bilirubin, Direct: 0.2 mg/dL (ref <=0.2)
Indirect Bilirubin: 0.5 mg/dL (ref 0.2–1.2)
TOTAL PROTEIN: 6.2 g/dL (ref 6.1–8.1)
Total Bilirubin: 0.7 mg/dL (ref 0.2–1.2)

## 2015-11-27 LAB — BASIC METABOLIC PANEL
BUN: 13 mg/dL (ref 7–25)
CO2: 28 mmol/L (ref 20–31)
Calcium: 9.2 mg/dL (ref 8.6–10.3)
Chloride: 105 mmol/L (ref 98–110)
Creat: 1.38 mg/dL — ABNORMAL HIGH (ref 0.70–1.25)
Glucose, Bld: 77 mg/dL (ref 65–99)
POTASSIUM: 4.5 mmol/L (ref 3.5–5.3)
Sodium: 142 mmol/L (ref 135–146)

## 2015-11-27 LAB — LIPID PANEL
CHOL/HDL RATIO: 1.7 ratio (ref <=5.0)
CHOLESTEROL: 91 mg/dL — AB (ref 125–200)
HDL: 53 mg/dL (ref >=40)
LDL Cholesterol: 27 mg/dL (ref <130)
TRIGLYCERIDES: 57 mg/dL (ref <150)
VLDL: 11 mg/dL (ref <30)

## 2015-11-27 LAB — FERRITIN: Ferritin: 33 ng/mL (ref 20–380)

## 2015-11-27 LAB — TSH: TSH: 1.31 m[IU]/L (ref 0.40–4.50)

## 2015-11-28 ENCOUNTER — Telehealth: Payer: Self-pay

## 2015-11-28 ENCOUNTER — Other Ambulatory Visit: Payer: Self-pay | Admitting: Family Medicine

## 2015-11-28 DIAGNOSIS — I10 Essential (primary) hypertension: Secondary | ICD-10-CM

## 2015-11-28 NOTE — Telephone Encounter (Signed)
Lee Taylor went to get a refill on Metoprolol 25 mg take 0.5 tablets bid. It was denied because medication was not on current medication list. Should he still be taking this medication?

## 2015-11-29 ENCOUNTER — Encounter: Payer: Self-pay | Admitting: Family Medicine

## 2015-11-29 MED ORDER — METOPROLOL TARTRATE 25 MG PO TABS: 12.5000 mg | ORAL_TABLET | Freq: Two times a day (BID) | ORAL | 3 refills | Status: DC

## 2015-11-29 NOTE — Telephone Encounter (Signed)
Metoprolol was accidentally deleted from his chart. Added it back and then sent refills to his pharmacy today.

## 2015-12-10 ENCOUNTER — Ambulatory Visit (INDEPENDENT_AMBULATORY_CARE_PROVIDER_SITE_OTHER): Payer: 59 | Admitting: Family Medicine

## 2015-12-10 VITALS — BP 119/74 | HR 66

## 2015-12-10 DIAGNOSIS — I10 Essential (primary) hypertension: Secondary | ICD-10-CM | POA: Diagnosis not present

## 2015-12-10 NOTE — Progress Notes (Signed)
   Subjective:    Patient ID: Lee Taylor, male    DOB: 04/25/1956, 60 y.o.   MRN: [id number]  HPI Pt here for BP check 119/74, P 66, O2 sat 99%   Review of Systems     Objective:   Physical Exam        Assessment & Plan:  Hypertension-looks better on half a tablet lisinopril. Continue with 5 mg daily.  Nani Gasseratherine Metheney, MD

## 2016-01-02 ENCOUNTER — Ambulatory Visit: Payer: BLUE CROSS/BLUE SHIELD | Admitting: Cardiology

## 2016-05-08 ENCOUNTER — Other Ambulatory Visit: Payer: Self-pay | Admitting: *Deleted

## 2016-05-08 MED ORDER — METOPROLOL TARTRATE 25 MG PO TABS: 12.5000 mg | ORAL_TABLET | Freq: Two times a day (BID) | ORAL | 6 refills | Status: DC

## 2016-05-18 ENCOUNTER — Other Ambulatory Visit: Payer: Self-pay | Admitting: Physician Assistant

## 2016-05-21 ENCOUNTER — Other Ambulatory Visit: Payer: Self-pay | Admitting: Cardiology

## 2016-05-21 ENCOUNTER — Ambulatory Visit: Payer: 59 | Admitting: Family Medicine

## 2016-05-22 ENCOUNTER — Ambulatory Visit: Payer: 59 | Admitting: Family Medicine

## 2016-05-23 ENCOUNTER — Encounter: Payer: Self-pay | Admitting: Family Medicine

## 2016-05-23 ENCOUNTER — Ambulatory Visit (INDEPENDENT_AMBULATORY_CARE_PROVIDER_SITE_OTHER): Payer: 59 | Admitting: Family Medicine

## 2016-05-23 VITALS — BP 127/64 | HR 66 | Ht 70.0 in | Wt 194.0 lb

## 2016-05-23 DIAGNOSIS — I2510 Atherosclerotic heart disease of native coronary artery without angina pectoris: Secondary | ICD-10-CM

## 2016-05-23 DIAGNOSIS — I2583 Coronary atherosclerosis due to lipid rich plaque: Secondary | ICD-10-CM

## 2016-05-23 DIAGNOSIS — R0789 Other chest pain: Secondary | ICD-10-CM | POA: Diagnosis not present

## 2016-05-23 LAB — COMPLETE METABOLIC PANEL WITH GFR
ALT: 36 U/L (ref 9–46)
AST: 29 U/L (ref 10–35)
Albumin: 4.2 g/dL (ref 3.6–5.1)
Alkaline Phosphatase: 71 U/L (ref 40–115)
BUN: 18 mg/dL (ref 7–25)
CALCIUM: 9.4 mg/dL (ref 8.6–10.3)
CHLORIDE: 104 mmol/L (ref 98–110)
CO2: 26 mmol/L (ref 20–31)
Creat: 1.26 mg/dL — ABNORMAL HIGH (ref 0.70–1.25)
GFR, EST AFRICAN AMERICAN: 71 mL/min (ref >=60)
GFR, EST NON AFRICAN AMERICAN: 62 mL/min (ref >=60)
Glucose, Bld: 96 mg/dL (ref 65–99)
Potassium: 5 mmol/L (ref 3.5–5.3)
Sodium: 141 mmol/L (ref 135–146)
Total Bilirubin: 0.4 mg/dL (ref 0.2–1.2)
Total Protein: 6.7 g/dL (ref 6.1–8.1)

## 2016-05-23 LAB — CBC WITH DIFFERENTIAL/PLATELET
BASOS ABS: 0 {cells}/uL (ref 0–200)
Basophils Relative: 0 %
EOS ABS: 134 {cells}/uL (ref 15–500)
Eosinophils Relative: 2 %
HCT: 46.9 % (ref 38.5–50.0)
Hemoglobin: 15.7 g/dL (ref 13.2–17.1)
LYMPHS PCT: 20 %
Lymphs Abs: 1340 cells/uL (ref 850–3900)
MCH: 31.7 pg (ref 27.0–33.0)
MCHC: 33.5 g/dL (ref 32.0–36.0)
MCV: 94.7 fL (ref 80.0–100.0)
MPV: 9.7 fL (ref 7.5–12.5)
Monocytes Absolute: 737 cells/uL (ref 200–950)
Monocytes Relative: 11 %
NEUTROS PCT: 67 %
Neutro Abs: 4489 cells/uL (ref 1500–7800)
PLATELETS: 221 10*3/uL (ref 140–400)
RBC: 4.95 MIL/uL (ref 4.20–5.80)
RDW: 13.2 % (ref 11.0–15.0)
WBC: 6.7 10*3/uL (ref 3.8–10.8)

## 2016-05-23 LAB — TSH: TSH: 1.82 m[IU]/L (ref 0.40–4.50)

## 2016-05-23 NOTE — Telephone Encounter (Signed)
Rx has been sent to the pharmacy electronically. ° °

## 2016-05-23 NOTE — Progress Notes (Signed)
Subjective:    Patient ID: Lee Taylor, male    DOB: 09/03/1955, 61 y.o.   MRN: [id number]  HPI 61 yo male with hx of CAD and stent placement 10/2014.  Started noticing some chest pain pressure mid-sternal when it got cold in his building.  The area that he works in is not actually heated. No SOB or cough or wheezing.  Says feels like gas at times. Him, but he says he knows it's not that. He denies any heartburn or reflux symptoms excess belching or burping. He says he notices the discomfort more rest and it actually seems to be better when he is up and active and when he is at work. He has taken nitroglycerin a couple times and he felt like it did help some. Her Premarin Jens SomCrenshaw is his cardiologist.   Review of Systems  BP 127/64   Pulse 66   Ht 5\' 10"  (1.778 m)   Wt 194 lb (88 kg)   SpO2 100%   BMI 27.84 kg/m     Allergies  Allergen Reactions  . Amoxicillin Hives    Past Medical History:  Diagnosis Date  . Acute kidney injury (HCC) 10/23/2014   "related to the MI"  . Coronary artery disease 10/2014   a. cath 10/23/2014 2.5 x 20 Synergy DES to the LAD, EF 40-45 percent   b. relook cath 6/29, patent stents, medical management  . GERD (gastroesophageal reflux disease)   . Hypercholesterolemia   . Hypertension   . Kidney stones    "passed it"  . NSTEMI (non-ST elevated myocardial infarction) (HCC) 10/20/2014   2.5 x 20 Synergy DES to the LAD, EF 40-45 percent    Past Surgical History:  Procedure Laterality Date  . CARDIAC CATHETERIZATION N/A 10/23/2014   Procedure: Left Heart Cath and Coronary Angiography;  Surgeon: Corky CraftsJayadeep S Varanasi, MD; LAD 95%, thrombotic, small ramus intermedius, CFX and OM branch is patent, RCA 20%     . CARDIAC CATHETERIZATION N/A 10/23/2014   Procedure: Coronary Stent Intervention;  Surgeon: Corky CraftsJayadeep S Varanasi, MD; 2.5 x 20 Synergy DES to the LAD    . CARDIAC CATHETERIZATION  11/01/2014  . CARDIAC CATHETERIZATION N/A 11/01/2014   Procedure: Left  Heart Cath and Coronary Angiography;  Surgeon: Lennette Biharihomas A Kelly, MD;  [facility];  Service: Cardiovascular;  Laterality: N/A;  . CORONARY ANGIOPLASTY WITH STENT PLACEMENT  10/23/2014   2.5 x 20 Synergy DES to the LAD  . ESOPHAGOGASTRODUODENOSCOPY N/A 07/07/2015   Procedure: ESOPHAGOGASTRODUODENOSCOPY (EGD);  Surgeon: Charlott RakesVincent Schooler, MD;  [facility];  Service: Endoscopy;  Laterality: N/A;  . GIVENS CAPSULE STUDY N/A 07/09/2015   Procedure: GIVENS CAPSULE STUDY;  Surgeon: Charlott RakesVincent Schooler, MD;  [facility];  Service: Endoscopy;  Laterality: N/A;  . TONSILLECTOMY    . TYMPANOPLASTY Left 1960's   "perforated eardrum"    Social History   Social History  . Marital status: Married    Spouse name: Gershon Cullriscilla   . Number of children: 2  . Years of education: N/A   Occupational History  . Occupational hygienistManufacturing Assembler [employer]   Social History Main Topics  . Smoking status: Never Smoker  . Smokeless tobacco: Never Used  . Alcohol use Yes     Comment: 11/01/2014 "maybe a beer q 2 wks"  . Drug use: No  . Sexual activity: Not Currently    Partners: Female   Other Topics Concern  . Not on file   Social History Narrative  .  No narrative on file    Family History  Problem Relation Age of Onset  . Stroke Father   . Heart failure Mother   . Heart disease Mother     triple bypass   . Heart attack Sister 65    smoker      Outpatient Encounter Prescriptions as of 05/23/2016  Medication Sig  . aspirin 81 MG tablet Take 81 mg by mouth daily.    [name] atorvastatin (LIPITOR) 80 MG tablet TAKE ONE TABLET BY MOUTH ONCE DAILY AT  6PM  . Calcium Carb-Cholecalciferol 600-800 MG-UNIT TABS Take 1 tablet by mouth daily.   . Ginkgo Biloba 40 MG TABS Take 60 mg by mouth daily.   . IRON PO Take 1 tablet by mouth daily.  [name] lisinopril (PRINIVIL,ZESTRIL) 10 MG tablet Take 1 tablet (10 mg total) by mouth daily.  . metoprolol tartrate (LOPRESSOR) 25 MG tablet Take 0.5 tablets  (12.5 mg total) by mouth 2 (two) times daily.  [name] NITROSTAT 0.4 MG SL tablet DISSOLVE ONE TABLET UNDER THE TONGUE EVERY 5 MINUTES AS NEEDED FOR CHEST PAIN.  DO NOT EXCEED A TOTAL OF 3 DOSES IN 15 MINUTES  . ranitidine (ZANTAC) 75 MG tablet Take 75 mg by mouth daily.  . [DISCONTINUED] Multiple Vitamin (MULTIVITAMIN) capsule Take 1 capsule by mouth daily.    . [DISCONTINUED] polycarbophil (FIBERCON) 625 MG tablet Take 625 mg by mouth daily.   No facility-administered encounter medications on file as of 05/23/2016.          Objective:   Physical Exam  Constitutional: He is oriented to person, place, and time. He appears well-developed and well-nourished.  HENT:  Head: Normocephalic and atraumatic.  Right Ear: External ear normal.  Left Ear: External ear normal.  Nose: Nose normal.  Mouth/Throat: Oropharynx is clear and moist.  TMs and canals are clear.   Eyes: Conjunctivae and EOM are normal. Pupils are equal, round, and reactive to light.  Neck: Neck supple. No thyromegaly present.  Cardiovascular: Normal rate and normal heart sounds.   Pulmonary/Chest: Effort normal and breath sounds normal.  Lymphadenopathy:    He has no cervical adenopathy.  Neurological: He is alert and oriented to person, place, and time.  Skin: Skin is warm and dry.  Psychiatric: He has a normal mood and affect.          Assessment & Plan:  Atypical chest atypical chest pain, in patient with prior history of coronary artery disease-we'll go ahead and get EKG and labs today. We'll schedule for stress test as well.  EKG shows Rate of 69 bpm, normal sinus rhythm with normal axis and no acute ST-T wave changes. No significant changes from previous EKG from May 2017. We'll move forward with stress test and referral back to cardiology for further evaluation. I'm concerned enough about his history that I think this needs to be investigated further.

## 2016-05-24 LAB — TROPONIN I: Troponin I: <0.01 ng/mL (ref <=0.05)

## 2016-05-24 LAB — D-DIMER, QUANTITATIVE: D-Dimer, Quant: 0.23 mcg/mL FEU (ref <0.50)

## 2016-06-12 NOTE — Progress Notes (Deleted)
HPI: FU CAD; admitted 6/16 with NSTEMI; had PCI of LAD with DES. Echo 6/16 showed EF 40-45, grade 1 diastolic dysfunction. Admitted with recurrent CP and repeat cath 11/01/14 showed 20 LAD, distal 70 LAD, 20 RCA, normal LV function; medical therapy recommended. Readmitted 7/16 with CP and ruled out. Patient has had GI bleed in March 2017. Endoscopy showed an ulcer. Seen recently by primary care for chest pain; TSH, hgb, troponin and ddimer normal. Since last seen,   Current Outpatient Prescriptions  Medication Sig Dispense Refill  . aspirin 81 MG tablet Take 81 mg by mouth daily.      [name] atorvastatin (LIPITOR) 80 MG tablet TAKE ONE TABLET BY MOUTH ONCE DAILY AT  6PM 30 tablet 6  . Calcium Carb-Cholecalciferol 600-800 MG-UNIT TABS Take 1 tablet by mouth daily.     . Ginkgo Biloba 40 MG TABS Take 60 mg by mouth daily.     . IRON PO Take 1 tablet by mouth daily.    [name] lisinopril (PRINIVIL,ZESTRIL) 10 MG tablet Take 1 tablet (10 mg total) by mouth daily. 90 tablet 3  . metoprolol tartrate (LOPRESSOR) 25 MG tablet Take 0.5 tablets (12.5 mg total) by mouth 2 (two) times daily. 45 tablet 6  . NITROSTAT 0.4 MG SL tablet DISSOLVE ONE TABLET UNDER THE TONGUE EVERY 5 MINUTES AS NEEDED FOR CHEST PAIN.  DO NOT EXCEED A TOTAL OF 3 DOSES IN 15 MINUTES 25 tablet 3  . ranitidine (ZANTAC) 75 MG tablet Take 75 mg by mouth daily.     No current facility-administered medications for this visit.      Past Medical History:  Diagnosis Date  . Acute kidney injury (HCC) 10/23/2014   "related to the MI"  . Coronary artery disease 10/2014   a. cath 10/23/2014 2.5 x 20 Synergy DES to the LAD, EF 40-45 percent   b. relook cath 6/29, patent stents, medical management  . GERD (gastroesophageal reflux disease)   . Hypercholesterolemia   . Hypertension   . Kidney stones    "passed it"  . NSTEMI (non-ST elevated myocardial infarction) (HCC) 10/20/2014   2.5 x 20 Synergy DES to the LAD, EF 40-45 percent    Past  Surgical History:  Procedure Laterality Date  . CARDIAC CATHETERIZATION N/A 10/23/2014   Procedure: Left Heart Cath and Coronary Angiography;  Surgeon: Corky Crafts, MD; LAD 95%, thrombotic, small ramus intermedius, CFX and OM branch is patent, RCA 20%     . CARDIAC CATHETERIZATION N/A 10/23/2014   Procedure: Coronary Stent Intervention;  Surgeon: Corky Crafts, MD; 2.5 x 20 Synergy DES to the LAD    . CARDIAC CATHETERIZATION  11/01/2014  . CARDIAC CATHETERIZATION N/A 11/01/2014   Procedure: Left Heart Cath and Coronary Angiography;  Surgeon: Lennette Bihari, MD;  [facility] INVASIVE CV LAB;  Service: Cardiovascular;  Laterality: N/A;  . CORONARY ANGIOPLASTY WITH STENT PLACEMENT  10/23/2014   2.5 x 20 Synergy DES to the LAD  . ESOPHAGOGASTRODUODENOSCOPY N/A 07/07/2015   Procedure: ESOPHAGOGASTRODUODENOSCOPY (EGD);  Surgeon: Charlott Rakes, MD;  [facility] ENDOSCOPY;  Service: Endoscopy;  Laterality: N/A;  . GIVENS CAPSULE STUDY N/A 07/09/2015   Procedure: GIVENS CAPSULE STUDY;  Surgeon: Charlott Rakes, MD;  Location: [facility] ENDOSCOPY;  Service: Endoscopy;  Laterality: N/A;  . TONSILLECTOMY    . TYMPANOPLASTY Left 1960's   "perforated eardrum"    Social History   Social History  . Marital status: Married    Spouse name: [name]   .  Number of children: 2  . Years of education: N/A   Occupational History  . Occupational hygienistManufacturing Assembler [employer]   Social History Main Topics  . Smoking status: Never Smoker  . Smokeless tobacco: Never Used  . Alcohol use Yes     Comment: 11/01/2014 "maybe a beer q 2 wks"  . Drug use: No  . Sexual activity: Not Currently    Partners: Female   Other Topics Concern  . Not on file   Social History Narrative  . No narrative on file    Family History  Problem Relation Age of Onset  . Stroke Father   . Heart failure Mother   . Heart disease Mother     triple bypass   . Heart attack Sister [id number]    smoker      ROS: no fevers or chills,  productive cough, hemoptysis, dysphasia, odynophagia, melena, hematochezia, dysuria, hematuria, rash, seizure activity, orthopnea, PND, pedal edema, claudication. Remaining systems are negative.  Physical Exam: Well-developed well-nourished in no acute distress.  Skin is warm and dry.  HEENT is normal.  Neck is supple.  Chest is clear to auscultation with normal expansion.  Cardiovascular exam is regular rate and rhythm.  Abdominal exam nontender or distended. No masses palpated. Extremities show no edema. neuro grossly intact  ECG

## 2016-06-18 ENCOUNTER — Ambulatory Visit: Payer: 59 | Admitting: Cardiology

## 2016-08-11 ENCOUNTER — Encounter: Payer: Self-pay | Admitting: Cardiology

## 2016-08-18 NOTE — Progress Notes (Deleted)
Referring- Reason for referral-  HPI:  FU CAD; admitted 6/16 with NSTEMI; had PCI of LAD with DES. Echo 6/16 showed EF 40-45, grade 1 diastolic dysfunction. Admitted with recurrent CP and repeat cath 11/01/14 showed 20 LAD, distal 70 LAD, 20 RCA, normal LV function; medical therapy recommended. Readmitted 7/16 with CP and ruled out. Patient has had GI bleed in March 2017. Endoscopy showed an ulcer. Since last seen,   Current Outpatient Prescriptions  Medication Sig Dispense Refill  . aspirin 81 MG tablet Take 81 mg by mouth daily.      [name] atorvastatin (LIPITOR) 80 MG tablet TAKE ONE TABLET BY MOUTH ONCE DAILY AT  6PM 30 tablet 6  . Calcium Carb-Cholecalciferol 600-800 MG-UNIT TABS Take 1 tablet by mouth daily.     . Ginkgo Biloba 40 MG TABS Take 60 mg by mouth daily.     . IRON PO Take 1 tablet by mouth daily.    [name] lisinopril (PRINIVIL,ZESTRIL) 10 MG tablet Take 1 tablet (10 mg total) by mouth daily. 90 tablet 3  . metoprolol tartrate (LOPRESSOR) 25 MG tablet Take 0.5 tablets (12.5 mg total) by mouth 2 (two) times daily. 45 tablet 6  . NITROSTAT 0.4 MG SL tablet DISSOLVE ONE TABLET UNDER THE TONGUE EVERY 5 MINUTES AS NEEDED FOR CHEST PAIN.  DO NOT EXCEED A TOTAL OF 3 DOSES IN 15 MINUTES 25 tablet 3  . ranitidine (ZANTAC) 75 MG tablet Take 75 mg by mouth daily.     No current facility-administered medications for this visit.     Allergies  Allergen Reactions  . Amoxicillin Hives    Past Medical History:  Diagnosis Date  . Acute kidney injury (HCC) 10/23/2014   "related to the MI"  . Coronary artery disease 10/2014   a. cath 10/23/2014 2.5 x 20 Synergy DES to the LAD, EF 40-45 percent   b. relook cath 6/29, patent stents, medical management  . GERD (gastroesophageal reflux disease)   . Hypercholesterolemia   . Hypertension   . Kidney stones    "passed it"  . NSTEMI (non-ST elevated myocardial infarction) (HCC) 10/20/2014   2.5 x 20 Synergy DES to the LAD, EF 40-45 percent     Past Surgical History:  Procedure Laterality Date  . CARDIAC CATHETERIZATION N/A 10/23/2014   Procedure: Left Heart Cath and Coronary Angiography;  Surgeon: Corky Crafts, MD; LAD 95%, thrombotic, small ramus intermedius, CFX and OM branch is patent, RCA 20%     . CARDIAC CATHETERIZATION N/A 10/23/2014   Procedure: Coronary Stent Intervention;  Surgeon: Corky Crafts, MD; 2.5 x 20 Synergy DES to the LAD    . CARDIAC CATHETERIZATION  11/01/2014  . CARDIAC CATHETERIZATION N/A 11/01/2014   Procedure: Left Heart Cath and Coronary Angiography;  Surgeon: Lennette Bihari, MD;  Location: [facility] INVASIVE CV LAB;  Service: Cardiovascular;  Laterality: N/A;  . CORONARY ANGIOPLASTY WITH STENT PLACEMENT  10/23/2014   2.5 x 20 Synergy DES to the LAD  . ESOPHAGOGASTRODUODENOSCOPY N/A 07/07/2015   Procedure: ESOPHAGOGASTRODUODENOSCOPY (EGD);  Surgeon: Charlott Rakes, MD;  Location: [facility] ENDOSCOPY;  Service: Endoscopy;  Laterality: N/A;  . GIVENS CAPSULE STUDY N/A 07/09/2015   Procedure: GIVENS CAPSULE STUDY;  Surgeon: Charlott Rakes, MD;  Location: [facility] ENDOSCOPY;  Service: Endoscopy;  Laterality: N/A;  . TONSILLECTOMY    . TYMPANOPLASTY Left 1960's   "perforated eardrum"    Social History   Social History  . Marital status: Married    Spouse name: [name]   .  Number of children: 2  . Years of education: N/A   Occupational History  . Occupational hygienist   Social History Main Topics  . Smoking status: Never Smoker  . Smokeless tobacco: Never Used  . Alcohol use Yes     Comment: 11/01/2014 "maybe a beer q 2 wks"  . Drug use: No  . Sexual activity: Not Currently    Partners: Female   Other Topics Concern  . Not on file   Social History Narrative  . No narrative on file    Family History  Problem Relation Age of Onset  . Stroke Father   . Heart failure Mother   . Heart disease Mother     triple bypass   . Heart attack Sister 68    smoker      ROS: no fevers or  chills, productive cough, hemoptysis, dysphasia, odynophagia, melena, hematochezia, dysuria, hematuria, rash, seizure activity, orthopnea, PND, pedal edema, claudication. Remaining systems are negative.  Physical Exam:   There were no vitals taken for this visit.  General:  Well developed/well nourished in NAD Skin warm/dry Patient not depressed No peripheral clubbing Back-normal HEENT-normal/normal eyelids Neck supple/normal carotid upstroke bilaterally; no bruits; no JVD; no thyromegaly chest - CTA/ normal expansion CV - RRR/normal S1 and S2; no murmurs, rubs or gallops;  PMI nondisplaced Abdomen -NT/ND, no HSM, no mass, + bowel sounds, no bruit 2+ femoral pulses, no bruits Ext-no edema, chords, 2+ DP Neuro-grossly nonfocal  ECG - personally reviewed  A/P  1  Olga Millers, MD

## 2016-08-27 ENCOUNTER — Ambulatory Visit: Payer: 59 | Admitting: Cardiology

## 2016-12-17 ENCOUNTER — Other Ambulatory Visit: Payer: Self-pay | Admitting: Cardiology

## 2016-12-24 ENCOUNTER — Other Ambulatory Visit: Payer: Self-pay | Admitting: Cardiology

## 2016-12-24 NOTE — Telephone Encounter (Signed)
Rx(s) sent to pharmacy electronically.  

## 2017-01-21 ENCOUNTER — Other Ambulatory Visit: Payer: Self-pay | Admitting: Cardiology

## 2017-02-18 ENCOUNTER — Other Ambulatory Visit: Payer: Self-pay | Admitting: Cardiology

## 2017-02-18 NOTE — Telephone Encounter (Signed)
Rx(s) sent to pharmacy electronically.  

## 2017-03-09 ENCOUNTER — Other Ambulatory Visit: Payer: Self-pay | Admitting: Family Medicine

## 2017-03-18 ENCOUNTER — Other Ambulatory Visit: Payer: Self-pay | Admitting: Cardiology

## 2017-03-25 ENCOUNTER — Other Ambulatory Visit: Payer: Self-pay | Admitting: Cardiology

## 2017-04-09 ENCOUNTER — Encounter: Payer: Self-pay | Admitting: Family Medicine

## 2017-04-09 ENCOUNTER — Ambulatory Visit: Payer: BLUE CROSS/BLUE SHIELD | Admitting: Family Medicine

## 2017-04-09 VITALS — BP 142/86 | HR 81 | Ht 69.5 in | Wt 203.0 lb

## 2017-04-09 DIAGNOSIS — S81802A Unspecified open wound, left lower leg, initial encounter: Secondary | ICD-10-CM

## 2017-04-09 MED ORDER — MUPIROCIN 2 % EX OINT: TOPICAL_OINTMENT | CUTANEOUS | 4 refills | Status: DC

## 2017-04-09 NOTE — Patient Instructions (Signed)
Thank you for coming in today. Keep the wound covered with ointment.  You can also use compression socks as needed.  Recheck if not improving.  Return sooner if needed.

## 2017-04-10 NOTE — Progress Notes (Signed)
Lee Taylor is a 61 y.o. male who [facility] today for left leg wound.  Marcas fell at work and skinned his left shin about 3 weeks ago.  He notes the wound is still not healed and is oozing.  He notes is mildly tender.  He denies any fevers or chills nausea vomiting or diarrhea.  He has used a menthol-containing skin cream to try to heal the wound.   Past Medical History:  Diagnosis Date  . Acute kidney injury (HCC) 10/23/2014   "related to the MI"  . Coronary artery disease 10/2014   a. cath 10/23/2014 2.5 x 20 Synergy DES to the LAD, EF 40-45 percent   b. relook cath 6/29, patent stents, medical management  . GERD (gastroesophageal reflux disease)   . Hypercholesterolemia   . Hypertension   . Kidney stones    "passed it"  . NSTEMI (non-ST elevated myocardial infarction) (HCC) 10/20/2014   2.5 x 20 Synergy DES to the LAD, EF 40-45 percent   Past Surgical History:  Procedure Laterality Date  . CARDIAC CATHETERIZATION N/A 10/23/2014   Procedure: Left Heart Cath and Coronary Angiography;  Surgeon: Corky CraftsJayadeep S Varanasi, MD; LAD 95%, thrombotic, small ramus intermedius, CFX and OM branch is patent, RCA 20%     . CARDIAC CATHETERIZATION N/A 10/23/2014   Procedure: Coronary Stent Intervention;  Surgeon: Corky CraftsJayadeep S Varanasi, MD; 2.5 x 20 Synergy DES to the LAD    . CARDIAC CATHETERIZATION  11/01/2014  . CARDIAC CATHETERIZATION N/A 11/01/2014   Procedure: Left Heart Cath and Coronary Angiography;  Surgeon: Lennette Biharihomas A Kelly, MD;  [facility];  Service: Cardiovascular;  Laterality: N/A;  . CORONARY ANGIOPLASTY WITH STENT PLACEMENT  10/23/2014   2.5 x 20 Synergy DES to the LAD  . ESOPHAGOGASTRODUODENOSCOPY N/A 07/07/2015   Procedure: ESOPHAGOGASTRODUODENOSCOPY (EGD);  Surgeon: Charlott RakesVincent Schooler, MD;  [facility];  Service: Endoscopy;   Laterality: N/A;  . GIVENS CAPSULE STUDY N/A 07/09/2015   Procedure: GIVENS CAPSULE STUDY;  Surgeon: Charlott RakesVincent Schooler, MD;  [facility];  Service: Endoscopy;  Laterality: N/A;  . TONSILLECTOMY    . TYMPANOPLASTY Left 1960's   "perforated eardrum"   Social History   Tobacco Use  . Smoking status: Never Smoker  . Smokeless tobacco: Never Used  Substance Use Topics  . Alcohol use: Yes    Comment: 11/01/2014 "maybe a beer q 2 wks"   family history includes Heart attack (age of onset: 2948) in his sister; Heart disease in his mother; Heart failure in his mother; Stroke in his father.  ROS as above:  Medications: Current Outpatient Medications  Medication Sig Dispense Refill  . aspirin 81 MG tablet Take 81 mg by mouth daily.      [name]. atorvastatin (LIPITOR) 80 MG tablet TAKE 1 TABLET BY MOUTH ONCE DAILY AT  6  PM 30 tablet 0  . Calcium Carb-Cholecalciferol 600-800 MG-UNIT TABS Take 1 tablet by mouth daily.     . Ginkgo Biloba 40 MG TABS Take 60 mg by mouth daily.     . IRON PO Take 1 tablet by mouth daily.    [name]. lisinopril (PRINIVIL,ZESTRIL) 10 MG tablet TAKE 1 TABLET BY MOUTH ONCE DAILY **PLEASE  CONTACT  OFFICE  FOR  ADDITIONAL  REFILLS 15 tablet 0  . metoprolol tartrate (LOPRESSOR) 25 MG tablet Take 0.5 tablets (12.5 mg total) 2 (two) times daily by mouth. Due for follow  up visit 30 tablet 0  . NITROSTAT 0.4 MG SL tablet DISSOLVE ONE TABLET UNDER THE TONGUE EVERY 5 MINUTES AS NEEDED FOR CHEST PAIN.  DO NOT EXCEED A TOTAL OF 3 DOSES IN 15 MINUTES 25 tablet 3  . ranitidine (ZANTAC) 75 MG tablet Take 75 mg by mouth daily.    . mupirocin ointment (BACTROBAN) 2 % Apply to affected area BID for 7 days. 30 g 4   No current facility-administered medications for this visit.    Allergies  Allergen Reactions  . Amoxicillin Hives    Health Maintenance Health Maintenance  Topic Date Due  . Hepatitis C Screening  1955/06/01  . HIV Screening  09/06/1970  . TETANUS/TDAP  06/16/2021  .  COLONOSCOPY  09/20/2023  . INFLUENZA VACCINE  Completed     Exam:  BP (!) 142/86   Pulse 81   Ht 5' 9.5" (1.765 m)   Wt 203 lb (92.1 kg)   BMI 29.55 kg/m  Gen: Well NAD Skin: Small nickel sized healing wound left anterior shin.  No surrounding erythema or induration minimally tender.       No results found for this or any previous visit (from the past 72 hour(s)). No results found.    Assessment and Plan: 61 y.o. male with wound of the left leg.  This is healing slowly.  I do not think it is infected but I do think he has been using irritating cream on the wound to try to get it to heal.  I have advised against using menthol-containing skin cream or hydrogen peroxide or rubbing alcohol.  I have prescribed some mupirocin antibiotic ointment which should definitely help.  Recommend recheck if not improving.   No orders of the defined types were placed in this encounter.  Meds ordered this encounter  Medications  . mupirocin ointment (BACTROBAN) 2 %    Sig: Apply to affected area BID for 7 days.    Dispense:  30 g    Refill:  4     Discussed warning signs or symptoms. Please see discharge instructions. Patient expresses understanding.

## 2017-04-15 ENCOUNTER — Other Ambulatory Visit: Payer: Self-pay | Admitting: Family Medicine

## 2017-04-22 ENCOUNTER — Other Ambulatory Visit: Payer: Self-pay | Admitting: Cardiology

## 2017-05-01 ENCOUNTER — Encounter: Payer: Self-pay | Admitting: Family Medicine

## 2017-05-01 ENCOUNTER — Ambulatory Visit: Payer: BLUE CROSS/BLUE SHIELD | Admitting: Family Medicine

## 2017-05-01 VITALS — BP 126/64 | HR 66 | Ht 70.0 in | Wt 200.0 lb

## 2017-05-01 DIAGNOSIS — N183 Chronic kidney disease, stage 3 unspecified: Secondary | ICD-10-CM

## 2017-05-01 DIAGNOSIS — I2510 Atherosclerotic heart disease of native coronary artery without angina pectoris: Secondary | ICD-10-CM | POA: Diagnosis not present

## 2017-05-01 DIAGNOSIS — D508 Other iron deficiency anemias: Secondary | ICD-10-CM

## 2017-05-01 DIAGNOSIS — I10 Essential (primary) hypertension: Secondary | ICD-10-CM

## 2017-05-01 DIAGNOSIS — Z125 Encounter for screening for malignant neoplasm of prostate: Secondary | ICD-10-CM | POA: Diagnosis not present

## 2017-05-01 DIAGNOSIS — E785 Hyperlipidemia, unspecified: Secondary | ICD-10-CM | POA: Diagnosis not present

## 2017-05-01 MED ORDER — LISINOPRIL 10 MG PO TABS: ORAL_TABLET | ORAL | 1 refills | Status: DC

## 2017-05-01 MED ORDER — ATORVASTATIN CALCIUM 80 MG PO TABS: ORAL_TABLET | ORAL | 3 refills | Status: DC

## 2017-05-01 MED ORDER — METOPROLOL TARTRATE 25 MG PO TABS: 12.5000 mg | ORAL_TABLET | Freq: Two times a day (BID) | ORAL | 1 refills | Status: DC

## 2017-05-01 NOTE — Progress Notes (Signed)
Subjective:    CC: CAD, BP  HPI:  CAD s/p stent-pain shortness of breath or palpitations.  Hx of iron def anemia secondary to GI bleed back in March 2017 a little over a year ago.  CKD 3  -no changes in urination. Avoids NSAIDs.    Hypertension- Pt denies chest pain, SOB, dizziness, or heart palpitations.  Taking meds as directed w/o problems.  Denies medication side effects.     Past medical history, Surgical history, Family history not pertinant except as noted below, Social history, Allergies, and medications have been entered into the medical record, reviewed, and corrections made.   Review of Systems: No fevers, chills, night sweats, weight loss, chest pain, or shortness of breath.   Objective:    General: Well Developed, well nourished, and in no acute distress.  Neuro: Alert and oriented x3, extra-ocular muscles intact, sensation grossly intact.  HEENT: Normocephalic, atraumatic  Skin: Warm and dry, no rashes. Cardiac: Regular rate and rhythm, no murmurs rubs or gallops, no lower extremity edema.  Respiratory: Clear to auscultation bilaterally. Not using accessory muscles, speaking in full sentences.   Impression and Recommendations:    CAD - Stable .  Recent changes.  Follows with cardiology yearly.  Hx of iron def anemia secondary to GI bleed.  Due to recheck iron levels.  HTN - Well controlled. Continue current regimen. Follow up in  6 months.    CKD 3  - due to recheck renal function.     Prostate Ca screening  - due for PSA.

## 2017-05-01 NOTE — Patient Instructions (Signed)
Consider the shingles vaccine, Shingrix.

## 2017-05-02 LAB — LIPID PANEL
CHOL/HDL RATIO: 2.1 (calc) (ref <5.0)
Cholesterol: 106 mg/dL (ref <200)
HDL: 50 mg/dL (ref >40)
LDL CHOLESTEROL (CALC): 35 mg/dL
NON-HDL CHOLESTEROL (CALC): 56 mg/dL (ref <130)
Triglycerides: 126 mg/dL (ref <150)

## 2017-05-02 LAB — CBC
HCT: 45.7 % (ref 38.5–50.0)
Hemoglobin: 15.7 g/dL (ref 13.2–17.1)
MCH: 32 pg (ref 27.0–33.0)
MCHC: 34.4 g/dL (ref 32.0–36.0)
MCV: 93.3 fL (ref 80.0–100.0)
MPV: 10.3 fL (ref 7.5–12.5)
PLATELETS: 234 10*3/uL (ref 140–400)
RBC: 4.9 10*6/uL (ref 4.20–5.80)
RDW: 11.8 % (ref 11.0–15.0)
WBC: 7.4 10*3/uL (ref 3.8–10.8)

## 2017-05-02 LAB — COMPLETE METABOLIC PANEL WITH GFR
AG Ratio: 1.9 (calc) (ref 1.0–2.5)
ALBUMIN MSPROF: 4.3 g/dL (ref 3.6–5.1)
ALKALINE PHOSPHATASE (APISO): 82 U/L (ref 40–115)
ALT: 42 U/L (ref 9–46)
AST: 30 U/L (ref 10–35)
BUN / CREAT RATIO: 14 (calc) (ref 6–22)
BUN: 21 mg/dL (ref 7–25)
CALCIUM: 9.9 mg/dL (ref 8.6–10.3)
CO2: 28 mmol/L (ref 20–32)
CREATININE: 1.54 mg/dL — AB (ref 0.70–1.25)
Chloride: 103 mmol/L (ref 98–110)
GFR, EST AFRICAN AMERICAN: 56 mL/min/{1.73_m2} — AB (ref >=60)
GFR, EST NON AFRICAN AMERICAN: 48 mL/min/{1.73_m2} — AB (ref >=60)
GLOBULIN: 2.3 g/dL (ref 1.9–3.7)
GLUCOSE: 81 mg/dL (ref 65–99)
Potassium: 4.8 mmol/L (ref 3.5–5.3)
SODIUM: 139 mmol/L (ref 135–146)
TOTAL PROTEIN: 6.6 g/dL (ref 6.1–8.1)
Total Bilirubin: 0.7 mg/dL (ref 0.2–1.2)

## 2017-05-02 LAB — FERRITIN: Ferritin: 150 ng/mL (ref 20–380)

## 2017-05-02 LAB — PSA: PSA: 2.4 ng/mL (ref <=4.0)

## 2017-06-17 ENCOUNTER — Other Ambulatory Visit: Payer: Self-pay | Admitting: Cardiology

## 2017-12-07 ENCOUNTER — Encounter: Payer: Self-pay | Admitting: Family Medicine

## 2017-12-07 ENCOUNTER — Ambulatory Visit (INDEPENDENT_AMBULATORY_CARE_PROVIDER_SITE_OTHER): Payer: BLUE CROSS/BLUE SHIELD | Admitting: Family Medicine

## 2017-12-07 VITALS — BP 120/68 | HR 68 | Ht 70.0 in | Wt 203.0 lb

## 2017-12-07 DIAGNOSIS — W57XXXA Bitten or stung by nonvenomous insect and other nonvenomous arthropods, initial encounter: Secondary | ICD-10-CM | POA: Diagnosis not present

## 2017-12-07 DIAGNOSIS — S60562A Insect bite (nonvenomous) of left hand, initial encounter: Secondary | ICD-10-CM | POA: Diagnosis not present

## 2017-12-07 MED ORDER — TRIAMCINOLONE ACETONIDE 0.5 % EX OINT: 1.0000 "application " | TOPICAL_OINTMENT | Freq: Two times a day (BID) | CUTANEOUS | 0 refills | Status: DC

## 2017-12-07 NOTE — Progress Notes (Signed)
Subjective:    Patient ID: Lee Taylor, male    DOB: 10/09/1955, 62 y.o.   MRN: [id number]  HPI pt reports that he was clearing some weeds on saturday and was bitten in several areas on his L side. he has been using oral benadryl.  Not exactly what type of insect it was but says it looked almost like a black beetle.  Approximately 2 to 3 cm in size.  He said it was a burning stinging type bite.  Its been a little itchy since but not intensely itchy.  The one on the dorsum of his left hand and on the left inner knee are quite large as far as the area of swelling and erythema.  No drainage from the wounds.  No fevers chills or sweats.    Review of Systems  BP 120/68   Pulse 68   Ht 5\' 10"  (1.778 m)   Wt 203 lb (92.1 kg)   SpO2 99%   BMI 29.13 kg/m     Allergies  Allergen Reactions  . Amoxicillin Hives    Past Medical History:  Diagnosis Date  . Acute kidney injury (HCC) 10/23/2014   "related to the MI"  . Coronary artery disease 10/2014   a. cath 10/23/2014 2.5 x 20 Synergy DES to the LAD, EF 40-45 percent   b. relook cath 6/29, patent stents, medical management  . GERD (gastroesophageal reflux disease)   . Hypercholesterolemia   . Hypertension   . Kidney stones    "passed it"  . NSTEMI (non-ST elevated myocardial infarction) (HCC) 10/20/2014   2.5 x 20 Synergy DES to the LAD, EF 40-45 percent    Past Surgical History:  Procedure Laterality Date  . CARDIAC CATHETERIZATION N/A 10/23/2014   Procedure: Left Heart Cath and Coronary Angiography;  Surgeon: Corky CraftsJayadeep S Varanasi, MD; LAD 95%, thrombotic, small ramus intermedius, CFX and OM branch is patent, RCA 20%     . CARDIAC CATHETERIZATION N/A 10/23/2014   Procedure: Coronary Stent Intervention;  Surgeon: Corky CraftsJayadeep S Varanasi, MD; 2.5 x 20 Synergy DES to the LAD    . CARDIAC CATHETERIZATION  11/01/2014  . CARDIAC CATHETERIZATION N/A 11/01/2014   Procedure: Left Heart Cath and Coronary Angiography;  Surgeon: Lennette Biharihomas A Kelly, MD;   [facility];  Service: Cardiovascular;  Laterality: N/A;  . CORONARY ANGIOPLASTY WITH STENT PLACEMENT  10/23/2014   2.5 x 20 Synergy DES to the LAD  . ESOPHAGOGASTRODUODENOSCOPY N/A 07/07/2015   Procedure: ESOPHAGOGASTRODUODENOSCOPY (EGD);  Surgeon: Charlott RakesVincent Schooler, MD;  [facility];  Service: Endoscopy;  Laterality: N/A;  . GIVENS CAPSULE STUDY N/A 07/09/2015   Procedure: GIVENS CAPSULE STUDY;  Surgeon: Charlott RakesVincent Schooler, MD;  [facility];  Service: Endoscopy;  Laterality: N/A;  . TONSILLECTOMY    . TYMPANOPLASTY Left 1960's   "perforated eardrum"    Social History   Socioeconomic History  . Marital status: Married    Spouse name: Gershon Cullriscilla   . Number of children: 2  . Years of education: Not on file  . Highest education level: Not on file  Occupational History  . Occupation: Tourist information centre managerManufacturing Assembler    Employer: [employer]  Social Needs  . Financial resource strain: Not on file  . Food insecurity:    Worry: Not on file    Inability: Not on file  . Transportation needs:    Medical: Not on file    Non-medical: Not on file  Tobacco Use  . Smoking status: Never Smoker  .  Smokeless tobacco: Never Used  Substance and Sexual Activity  . Alcohol use: Yes    Comment: 11/01/2014 "maybe a beer q 2 wks"  . Drug use: No  . Sexual activity: Not Currently    Partners: Female  Lifestyle  . Physical activity:    Days per week: Not on file    Minutes per session: Not on file  . Stress: Not on file  Relationships  . Social connections:    Talks on phone: Not on file    Gets together: Not on file    Attends religious service: Not on file    Active member of club or organization: Not on file    Attends meetings of clubs or organizations: Not on file    Relationship status: Not on file  . Intimate partner violence:    Fear of current or ex partner: Not on file    Emotionally abused: Not on file    Physically abused: Not on file    Forced sexual  activity: Not on file  Other Topics Concern  . Not on file  Social History Narrative  . Not on file    Family History  Problem Relation Age of Onset  . Stroke Father   . Heart failure Mother   . Heart disease Mother        triple bypass   . Heart attack Sister 87       smoker      Outpatient Encounter Medications as of 12/07/2017  Medication Sig  . aspirin 81 MG tablet Take 81 mg by mouth every other day.  [name] atorvastatin (LIPITOR) 80 MG tablet TAKE 1 TABLET BY MOUTH ONCE DAILY AT  6  PM  . Calcium Carb-Cholecalciferol 600-800 MG-UNIT TABS Take 1 tablet by mouth daily.   . Ginkgo Biloba 40 MG TABS Take 60 mg by mouth daily.   [name] lisinopril (PRINIVIL,ZESTRIL) 10 MG tablet TAKE 1 TABLET BY MOUTH ONCE DAILY  . nitroGLYCERIN (NITROSTAT) 0.4 MG SL tablet DISSOLVE ONE TABLET UNDER THE TONGUE EVERY 5 MINUTES AS NEEDED FOR CHEST PAIN.  DO NOT EXCEED A TOTAL OF 3 DOSES IN 15 MINUTES  . ranitidine (ZANTAC) 75 MG tablet Take 75 mg by mouth daily.  . metoprolol tartrate (LOPRESSOR) 25 MG tablet Take 0.5 tablets (12.5 mg total) by mouth 2 (two) times daily.  [name] triamcinolone ointment (KENALOG) 0.5 % Apply 1 application topically 2 (two) times daily.  . [DISCONTINUED] IRON PO Take 1 tablet by mouth daily.  . [DISCONTINUED] mupirocin ointment (BACTROBAN) 2 % Apply to affected area BID for 7 days.   No facility-administered encounter medications on file as of 12/07/2017.           Objective:   Physical Exam  Constitutional: He is oriented to person, place, and time. He appears well-developed and well-nourished.  HENT:  Head: Normocephalic and atraumatic.  Eyes: Conjunctivae and EOM are normal.  Cardiovascular: Normal rate.  Pulmonary/Chest: Effort normal.  Neurological: He is alert and oriented to person, place, and time.  Skin: Skin is dry. No pallor.  Has mild erythema but significant swelling on the dorsum of the left hand.  Similar lesion on the left inner knee.  He has 2 smaller  erythematous palpable lesions on the left inner forearm, left upper shoulder and near his left ear.  No active drainage or open wounds.  No significant excoriations.  Psychiatric: He has a normal mood and affect. His behavior is normal.  Vitals reviewed.  Assessment & Plan:  Mulitple insect bites. Will tx with topical steroid cream. 2 are quite large with significant swelling the one particularly on the dorsum of the left hand and the left inner knee.  Recommend topical steroid cream and if not improving consider oral prednisone.  Okay to continue with oral Benadryl.  Avoid scratching and irritation.  If not improving then please let me know.  Return for any sign of infection that may require antibiotics.

## 2017-12-25 ENCOUNTER — Encounter: Payer: Self-pay | Admitting: Family Medicine

## 2017-12-25 ENCOUNTER — Ambulatory Visit (INDEPENDENT_AMBULATORY_CARE_PROVIDER_SITE_OTHER): Payer: BLUE CROSS/BLUE SHIELD | Admitting: Family Medicine

## 2017-12-25 VITALS — BP 129/64 | HR 63 | Ht 70.0 in | Wt 200.0 lb

## 2017-12-25 DIAGNOSIS — I10 Essential (primary) hypertension: Secondary | ICD-10-CM

## 2017-12-25 DIAGNOSIS — Z125 Encounter for screening for malignant neoplasm of prostate: Secondary | ICD-10-CM

## 2017-12-25 DIAGNOSIS — E785 Hyperlipidemia, unspecified: Secondary | ICD-10-CM

## 2017-12-25 DIAGNOSIS — Z0000 Encounter for general adult medical examination without abnormal findings: Secondary | ICD-10-CM

## 2017-12-25 NOTE — Patient Instructions (Signed)

## 2017-12-25 NOTE — Progress Notes (Signed)
Subjective:    Patient ID: Lee Taylor, male    DOB: 06/10/1955, 62 y.o.   MRN: [id number]  HPI 61 year old male is here today for complete physical exam.  Is doing well overall.  He is not currently exercising but he is been working a lot of overtime at work.  He has not had any recent chest pain or shortness of breath.   Review of Systems   Comprehensive of review of systems is negative except for HPI.  BP 129/64   Pulse 63   Ht 5\' 10"  (1.778 m)   Wt 200 lb (90.7 kg)   SpO2 100%   BMI 28.70 kg/m     Allergies  Allergen Reactions  . Amoxicillin Hives    Past Medical History:  Diagnosis Date  . Acute kidney injury (HCC) 10/23/2014   "related to the MI"  . Coronary artery disease 10/2014   a. cath 10/23/2014 2.5 x 20 Synergy DES to the LAD, EF 40-45 percent   b. relook cath 6/29, patent stents, medical management  . GERD (gastroesophageal reflux disease)   . Hypercholesterolemia   . Hypertension   . Kidney stones    "passed it"  . NSTEMI (non-ST elevated myocardial infarction) (HCC) 10/20/2014   2.5 x 20 Synergy DES to the LAD, EF 40-45 percent    Past Surgical History:  Procedure Laterality Date  . CARDIAC CATHETERIZATION N/A 10/23/2014   Procedure: Left Heart Cath and Coronary Angiography;  Surgeon: Corky Crafts, MD; LAD 95%, thrombotic, small ramus intermedius, CFX and OM branch is patent, RCA 20%     . CARDIAC CATHETERIZATION N/A 10/23/2014   Procedure: Coronary Stent Intervention;  Surgeon: Corky Crafts, MD; 2.5 x 20 Synergy DES to the LAD    . CARDIAC CATHETERIZATION  11/01/2014  . CARDIAC CATHETERIZATION N/A 11/01/2014   Procedure: Left Heart Cath and Coronary Angiography;  Surgeon: Lennette Bihari, MD;  [facility]: [facility] INVASIVE CV LAB;  Service: Cardiovascular;  Laterality: N/A;  . CORONARY ANGIOPLASTY WITH STENT PLACEMENT  10/23/2014   2.5 x 20 Synergy DES to the LAD  . ESOPHAGOGASTRODUODENOSCOPY N/A 07/07/2015   Procedure: ESOPHAGOGASTRODUODENOSCOPY  (EGD);  Surgeon: Charlott Rakes, MD;  [facility] ENDOSCOPY;  Service: Endoscopy;  Laterality: N/A;  . GIVENS CAPSULE STUDY N/A 07/09/2015   Procedure: GIVENS CAPSULE STUDY;  Surgeon: Charlott Rakes, MD;  Location: [facility] ENDOSCOPY;  Service: Endoscopy;  Laterality: N/A;  . TONSILLECTOMY    . TYMPANOPLASTY Left 1960's   "perforated eardrum"    Social History   Socioeconomic History  . Marital status: Married    Spouse name: [name]   . Number of children: 2  . Years of education: Not on file  . Highest education level: Not on file  Occupational History  . Occupation: Tourist information centre manager: [employer]  Social Needs  . Financial resource strain: Not on file  . Food insecurity:    Worry: Not on file    Inability: Not on file  . Transportation needs:    Medical: Not on file    Non-medical: Not on file  Tobacco Use  . Smoking status: Never Smoker  . Smokeless tobacco: Never Used  Substance and Sexual Activity  . Alcohol use: Yes    Comment: 11/01/2014 "maybe a beer q 2 wks"  . Drug use: No  . Sexual activity: Not Currently    Partners: Female  Lifestyle  . Physical activity:    Days per week: Not on file  Minutes per session: Not on file  . Stress: Not on file  Relationships  . Social connections:    Talks on phone: Not on file    Gets together: Not on file    Attends religious service: Not on file    Active member of club or organization: Not on file    Attends meetings of clubs or organizations: Not on file    Relationship status: Not on file  . Intimate partner violence:    Fear of current or ex partner: Not on file    Emotionally abused: Not on file    Physically abused: Not on file    Forced sexual activity: Not on file  Other Topics Concern  . Not on file  Social History Narrative  . Not on file    Family History  Problem Relation Age of Onset  . Stroke Father   . Heart failure Mother   . Heart disease Mother        triple bypass   .  Heart attack Sister [id number]       smoker      Outpatient Encounter Medications as of 12/25/2017  Medication Sig  . aspirin 81 MG tablet Take 81 mg by mouth every other day.  [name]. atorvastatin (LIPITOR) 80 MG tablet TAKE 1 TABLET BY MOUTH ONCE DAILY AT  6  PM  . Calcium Carb-Cholecalciferol 600-800 MG-UNIT TABS Take 1 tablet by mouth daily.   . Ginkgo Biloba 40 MG TABS Take 60 mg by mouth daily.   [name]. lisinopril (PRINIVIL,ZESTRIL) 10 MG tablet TAKE 1 TABLET BY MOUTH ONCE DAILY  . nitroGLYCERIN (NITROSTAT) 0.4 MG SL tablet DISSOLVE ONE TABLET UNDER THE TONGUE EVERY 5 MINUTES AS NEEDED FOR CHEST PAIN.  DO NOT EXCEED A TOTAL OF 3 DOSES IN 15 MINUTES  . ranitidine (ZANTAC) 75 MG tablet Take 75 mg by mouth daily.  [name]. triamcinolone ointment (KENALOG) 0.5 % Apply 1 application topically 2 (two) times daily.  . metoprolol tartrate (LOPRESSOR) 25 MG tablet Take 0.5 tablets (12.5 mg total) by mouth 2 (two) times daily.   No facility-administered encounter medications on file as of 12/25/2017.            Objective:   Physical Exam  Constitutional: He is oriented to person, place, and time. He appears well-developed and well-nourished.  HENT:  Head: Normocephalic and atraumatic.  Right Ear: External ear normal.  Left Ear: External ear normal.  Nose: Nose normal.  Mouth/Throat: Oropharynx is clear and moist.  Eyes: Pupils are equal, round, and reactive to light. Conjunctivae and EOM are normal.  Neck: Normal range of motion. Neck supple. No thyromegaly present.  Cardiovascular: Normal rate, regular rhythm, normal heart sounds and intact distal pulses.  Pulmonary/Chest: Effort normal and breath sounds normal.  Abdominal: Soft. Bowel sounds are normal. He exhibits no distension and no mass. There is no tenderness. There is no rebound and no guarding.  Musculoskeletal: Normal range of motion.  Lymphadenopathy:    He has no cervical adenopathy.  Neurological: He is alert and oriented to person, place, and  time. He has normal reflexes.  Skin: Skin is warm and dry.  Psychiatric: He has a normal mood and affect. His behavior is normal. Judgment and thought content normal.         Assessment & Plan:  CPE Keep up a regular exercise program and make sure you are eating a healthy diet Try to eat 4 servings of dairy a day, or if you  are lactose intolerant take a calcium with vitamin D daily.  Your vaccines are up to date.  He went for labs today.  Did encourage more regular exercise.

## 2017-12-26 LAB — LIPID PANEL
CHOLESTEROL: 101 mg/dL (ref <200)
HDL: 52 mg/dL (ref >40)
LDL Cholesterol (Calc): 36 mg/dL (calc)
Non-HDL Cholesterol (Calc): 49 mg/dL (calc) (ref <130)
Total CHOL/HDL Ratio: 1.9 (calc) (ref <5.0)
Triglycerides: 57 mg/dL (ref <150)

## 2017-12-26 LAB — COMPLETE METABOLIC PANEL WITH GFR
AG RATIO: 2 (calc) (ref 1.0–2.5)
ALKALINE PHOSPHATASE (APISO): 79 U/L (ref 40–115)
ALT: 37 U/L (ref 9–46)
AST: 34 U/L (ref 10–35)
Albumin: 4.4 g/dL (ref 3.6–5.1)
BILIRUBIN TOTAL: 0.9 mg/dL (ref 0.2–1.2)
BUN/Creatinine Ratio: 13 (calc) (ref 6–22)
BUN: 16 mg/dL (ref 7–25)
CO2: 26 mmol/L (ref 20–32)
CREATININE: 1.26 mg/dL — AB (ref 0.70–1.25)
Calcium: 9.9 mg/dL (ref 8.6–10.3)
Chloride: 102 mmol/L (ref 98–110)
GFR, Est African American: 70 mL/min/{1.73_m2} (ref >=60)
GFR, Est Non African American: 61 mL/min/{1.73_m2} (ref >=60)
GLOBULIN: 2.2 g/dL (ref 1.9–3.7)
Glucose, Bld: 87 mg/dL (ref 65–99)
POTASSIUM: 5.1 mmol/L (ref 3.5–5.3)
SODIUM: 137 mmol/L (ref 135–146)
Total Protein: 6.6 g/dL (ref 6.1–8.1)

## 2017-12-26 LAB — CBC
HCT: 43.3 % (ref 38.5–50.0)
Hemoglobin: 14.7 g/dL (ref 13.2–17.1)
MCH: 31.3 pg (ref 27.0–33.0)
MCHC: 33.9 g/dL (ref 32.0–36.0)
MCV: 92.1 fL (ref 80.0–100.0)
MPV: 10.1 fL (ref 7.5–12.5)
PLATELETS: 229 10*3/uL (ref 140–400)
RBC: 4.7 10*6/uL (ref 4.20–5.80)
RDW: 12 % (ref 11.0–15.0)
WBC: 8.4 10*3/uL (ref 3.8–10.8)

## 2017-12-26 LAB — PSA: PSA: 2.8 ng/mL (ref <=4.0)

## 2018-01-06 ENCOUNTER — Other Ambulatory Visit: Payer: Self-pay | Admitting: Family Medicine

## 2018-02-02 ENCOUNTER — Telehealth: Payer: Self-pay

## 2018-02-02 NOTE — Telephone Encounter (Signed)
Pt called- concerned with stores removing Zantac from the shelves. States he has been on this for a very long time and is not sure if he should be concerned? Should he start using alternative?  Please advise

## 2018-02-03 NOTE — Telephone Encounter (Signed)
Recommend consider switching to omeprazole 20 mg about 15 to 20 minutes before breakfast daily instead until they correct the issues with the Zantac.

## 2018-02-03 NOTE — Telephone Encounter (Signed)
Pt advised, will start Omeprazole OTC

## 2018-04-13 ENCOUNTER — Ambulatory Visit: Payer: BLUE CROSS/BLUE SHIELD | Admitting: Sports Medicine

## 2018-04-30 ENCOUNTER — Other Ambulatory Visit: Payer: Self-pay | Admitting: Family Medicine

## 2018-05-04 ENCOUNTER — Ambulatory Visit (INDEPENDENT_AMBULATORY_CARE_PROVIDER_SITE_OTHER): Payer: BLUE CROSS/BLUE SHIELD | Admitting: Family Medicine

## 2018-05-04 ENCOUNTER — Encounter: Payer: Self-pay | Admitting: Family Medicine

## 2018-05-04 VITALS — BP 124/64 | HR 78 | Ht 70.0 in | Wt 200.0 lb

## 2018-05-04 DIAGNOSIS — J329 Chronic sinusitis, unspecified: Secondary | ICD-10-CM | POA: Diagnosis not present

## 2018-05-04 DIAGNOSIS — J40 Bronchitis, not specified as acute or chronic: Secondary | ICD-10-CM

## 2018-05-04 MED ORDER — AZITHROMYCIN 250 MG PO TABS: ORAL_TABLET | ORAL | 0 refills | Status: AC

## 2018-05-04 NOTE — Patient Instructions (Signed)

## 2018-05-04 NOTE — Progress Notes (Signed)
Acute Office Visit  Subjective:    Patient ID: Lee Taylor, male    DOB: 02-12-1956, 62 y.o.   MRN: [id number]  Chief Complaint  Patient presents with  . Cough    HPI Patient is in today for acute illness for greater than 1 week.  He says it started initially with a sore throat and then was mostly sinus congestion and sore throat.  He has not had a fever or chills or sweats.  No significant ear pain.  He now has a cough that has started over the weekend and has persisted.  He has been using mostly some cough syrup at night and DayQuil.  The cough is mostly dry but intermittently wet he feels like he is getting some congestion in his mid upper chest area.  No significant shortness of breath.  Past Medical History:  Diagnosis Date  . Acute kidney injury (HCC) 10/23/2014   "related to the MI"  . Coronary artery disease 10/2014   a. cath 10/23/2014 2.5 x 20 Synergy DES to the LAD, EF 40-45 percent   b. relook cath 6/29, patent stents, medical management  . GERD (gastroesophageal reflux disease)   . Hypercholesterolemia   . Hypertension   . Kidney stones    "passed it"  . NSTEMI (non-ST elevated myocardial infarction) (HCC) 10/20/2014   2.5 x 20 Synergy DES to the LAD, EF 40-45 percent    Past Surgical History:  Procedure Laterality Date  . CARDIAC CATHETERIZATION N/A 10/23/2014   Procedure: Left Heart Cath and Coronary Angiography;  Surgeon: Corky Crafts, MD; LAD 95%, thrombotic, small ramus intermedius, CFX and OM branch is patent, RCA 20%     . CARDIAC CATHETERIZATION N/A 10/23/2014   Procedure: Coronary Stent Intervention;  Surgeon: Corky Crafts, MD; 2.5 x 20 Synergy DES to the LAD    . CARDIAC CATHETERIZATION  11/01/2014  . CARDIAC CATHETERIZATION N/A 11/01/2014   Procedure: Left Heart Cath and Coronary Angiography;  Surgeon: Lennette Bihari, MD;  Location: [facility] INVASIVE CV LAB;  Service: Cardiovascular;  Laterality: N/A;  . CORONARY ANGIOPLASTY WITH STENT PLACEMENT   10/23/2014   2.5 x 20 Synergy DES to the LAD  . ESOPHAGOGASTRODUODENOSCOPY N/A 07/07/2015   Procedure: ESOPHAGOGASTRODUODENOSCOPY (EGD);  Surgeon: Charlott Rakes, MD;  Location: [facility] ENDOSCOPY;  Service: Endoscopy;  Laterality: N/A;  . GIVENS CAPSULE STUDY N/A 07/09/2015   Procedure: GIVENS CAPSULE STUDY;  Surgeon: Charlott Rakes, MD;  Location: [facility] ENDOSCOPY;  Service: Endoscopy;  Laterality: N/A;  . TONSILLECTOMY    . TYMPANOPLASTY Left 1960's   "perforated eardrum"    Family History  Problem Relation Age of Onset  . Stroke Father   . Heart failure Mother   . Heart disease Mother        triple bypass   . Heart attack Sister 56       smoker      Social History   Socioeconomic History  . Marital status: Married    Spouse name: [name]   . Number of children: 2  . Years of education: Not on file  . Highest education level: Not on file  Occupational History  . Occupation: Tourist information centre manager: [employer]  Social Needs  . Financial resource strain: Not on file  . Food insecurity:    Worry: Not on file    Inability: Not on file  . Transportation needs:    Medical: Not on file    Non-medical: Not on file  Tobacco Use  . Smoking status: Never Smoker  . Smokeless tobacco: Never Used  Substance and Sexual Activity  . Alcohol use: Yes    Comment: 11/01/2014 "maybe a beer q 2 wks"  . Drug use: No  . Sexual activity: Not Currently    Partners: Female  Lifestyle  . Physical activity:    Days per week: Not on file    Minutes per session: Not on file  . Stress: Not on file  Relationships  . Social connections:    Talks on phone: Not on file    Gets together: Not on file    Attends religious service: Not on file    Active member of club or organization: Not on file    Attends meetings of clubs or organizations: Not on file    Relationship status: Not on file  . Intimate partner violence:    Fear of current or ex partner: Not on file    Emotionally abused:  Not on file    Physically abused: Not on file    Forced sexual activity: Not on file  Other Topics Concern  . Not on file  Social History Narrative  . Not on file    Outpatient Medications Prior to Visit  Medication Sig Dispense Refill  . aspirin 81 MG tablet Take 81 mg by mouth every other day.    [name]. atorvastatin (LIPITOR) 80 MG tablet TAKE 1 TABLET BY MOUTH ONCE DAILY AT  6  PM 90 tablet 3  . Calcium Carb-Cholecalciferol 600-800 MG-UNIT TABS Take 1 tablet by mouth daily.     . Ginkgo Biloba 40 MG TABS Take 60 mg by mouth daily.     [name]. lisinopril (PRINIVIL,ZESTRIL) 10 MG tablet TAKE 1 TABLET BY MOUTH ONCE DAILY 90 tablet 0  . metoprolol tartrate (LOPRESSOR) 25 MG tablet TAKE 1/2 (ONE-HALF) TABLET BY MOUTH TWICE DAILY 90 tablet 1  . nitroGLYCERIN (NITROSTAT) 0.4 MG SL tablet DISSOLVE ONE TABLET UNDER THE TONGUE EVERY 5 MINUTES AS NEEDED FOR CHEST PAIN.  DO NOT EXCEED A TOTAL OF 3 DOSES IN 15 MINUTES 25 tablet 3  . ranitidine (ZANTAC) 75 MG tablet Take 75 mg by mouth daily.     No facility-administered medications prior to visit.     Allergies  Allergen Reactions  . Amoxicillin Hives    ROS     Objective:    Physical Exam  Constitutional: He is oriented to person, place, and time. He appears well-developed and well-nourished.  HENT:  Head: Normocephalic and atraumatic.  Right Ear: External ear normal.  Left Ear: External ear normal.  Nose: Nose normal.  Mouth/Throat: Oropharynx is clear and moist.  TMs and canals are clear.   Eyes: Pupils are equal, round, and reactive to light. Conjunctivae and EOM are normal.  Neck: Neck supple. No thyromegaly present.  Cardiovascular: Normal rate and normal heart sounds.  Pulmonary/Chest: Effort normal and breath sounds normal.  Lymphadenopathy:    He has no cervical adenopathy.  Neurological: He is alert and oriented to person, place, and time.  Skin: Skin is warm and dry.  Psychiatric: He has a normal mood and affect.    BP  124/64   Pulse 78   Ht 5\' 10"  (1.778 m)   Wt 200 lb (90.7 kg)   SpO2 98%   BMI 28.70 kg/m  Wt Readings from Last 3 Encounters:  05/04/18 200 lb (90.7 kg)  12/25/17 200 lb (90.7 kg)  12/07/17 203 lb (92.1 kg)  Health Maintenance Due  Topic Date Due  . Hepatitis C Screening  [birth year]  . HIV Screening  09/06/1970    There are no preventive care reminders to display for this patient.   Lab Results  Component Value Date   TSH 1.82 05/23/2016   Lab Results  Component Value Date   WBC 8.4 12/25/2017   HGB 14.7 12/25/2017   HCT 43.3 12/25/2017   MCV 92.1 12/25/2017   PLT 229 12/25/2017   Lab Results  Component Value Date   NA 137 12/25/2017   K 5.1 12/25/2017   CO2 26 12/25/2017   GLUCOSE 87 12/25/2017   BUN 16 12/25/2017   CREATININE 1.26 (H) 12/25/2017   BILITOT 0.9 12/25/2017   ALKPHOS 71 05/23/2016   AST 34 12/25/2017   ALT 37 12/25/2017   PROT 6.6 12/25/2017   ALBUMIN 4.2 05/23/2016   CALCIUM 9.9 12/25/2017   ANIONGAP 6 07/10/2015   Lab Results  Component Value Date   CHOL 101 12/25/2017   Lab Results  Component Value Date   HDL 52 12/25/2017   Lab Results  Component Value Date   LDLCALC 36 12/25/2017   Lab Results  Component Value Date   TRIG 57 12/25/2017   Lab Results  Component Value Date   CHOLHDL 1.9 12/25/2017   Lab Results  Component Value Date   HGBA1C 5.6 10/24/2014       Assessment & Plan:   Problem List Items Addressed This Visit    None    Visit Diagnoses    Sinobronchitis    -  Primary   Relevant Medications   azithromycin (ZITHROMAX) 250 MG tablet     Symptoms persistent for greater than 1 week.  Explained that it could be viral but he is on the verge of treating with antibiotics.  To go ahead and send over prescription today to fill if not improving.  Call if any worsening or non-resolving symptoms.  Meds ordered this encounter  Medications  . azithromycin (ZITHROMAX) 250 MG tablet    Sig: 2 Ttabs PO on Day  1, then one a day x 4 days.    Dispense:  6 tablet    Refill:  0     Nani Gasseratherine Lamara Brecht, MD

## 2018-05-27 ENCOUNTER — Other Ambulatory Visit: Payer: Self-pay | Admitting: Family Medicine

## 2018-07-08 ENCOUNTER — Other Ambulatory Visit: Payer: Self-pay | Admitting: Family Medicine

## 2018-08-19 ENCOUNTER — Other Ambulatory Visit: Payer: Self-pay | Admitting: Family Medicine

## 2018-10-08 ENCOUNTER — Other Ambulatory Visit: Payer: Self-pay | Admitting: Family Medicine

## 2018-10-29 ENCOUNTER — Other Ambulatory Visit: Payer: Self-pay | Admitting: Family Medicine

## 2018-11-05 ENCOUNTER — Other Ambulatory Visit: Payer: Self-pay | Admitting: Family Medicine

## 2018-11-26 ENCOUNTER — Other Ambulatory Visit: Payer: Self-pay | Admitting: Family Medicine

## 2018-12-10 ENCOUNTER — Other Ambulatory Visit: Payer: Self-pay | Admitting: Family Medicine

## 2018-12-31 ENCOUNTER — Ambulatory Visit (INDEPENDENT_AMBULATORY_CARE_PROVIDER_SITE_OTHER): Payer: BC Managed Care – PPO | Admitting: Family Medicine

## 2018-12-31 ENCOUNTER — Encounter: Payer: Self-pay | Admitting: Family Medicine

## 2018-12-31 ENCOUNTER — Other Ambulatory Visit: Payer: Self-pay

## 2018-12-31 VITALS — BP 129/65 | HR 66 | Ht 70.0 in | Wt 191.0 lb

## 2018-12-31 DIAGNOSIS — Z0000 Encounter for general adult medical examination without abnormal findings: Secondary | ICD-10-CM | POA: Diagnosis not present

## 2018-12-31 DIAGNOSIS — E785 Hyperlipidemia, unspecified: Secondary | ICD-10-CM | POA: Diagnosis not present

## 2018-12-31 NOTE — Progress Notes (Signed)
Established Patient Office Visit - CPE  Subjective:  Patient ID: Lee Taylor, male    DOB: 12/13/1955  Age: 63 y.o. MRN: [id number]  CC:  Chief Complaint  Patient presents with  . Annual Exam    HPI Lee Manifoldhomas Esker presents for CPE.  He doesn't exercise outside of work but has a very physically active job.  He feels his diet is good.  He recently started taking Garlic.  Does wear a hat and use sunscreen.    Past Medical History:  Diagnosis Date  . Acute kidney injury (HCC) 10/23/2014   "related to the MI"  . Coronary artery disease 10/2014   a. cath 10/23/2014 2.5 x 20 Synergy DES to the LAD, EF 40-45 percent   b. relook cath 6/29, patent stents, medical management  . GERD (gastroesophageal reflux disease)   . Hypercholesterolemia   . Hypertension   . Kidney stones    "passed it"  . NSTEMI (non-ST elevated myocardial infarction) (HCC) 10/20/2014   2.5 x 20 Synergy DES to the LAD, EF 40-45 percent    Past Surgical History:  Procedure Laterality Date  . CARDIAC CATHETERIZATION N/A 10/23/2014   Procedure: Left Heart Cath and Coronary Angiography;  Surgeon: Corky CraftsJayadeep S Varanasi, MD; LAD 95%, thrombotic, small ramus intermedius, CFX and OM branch is patent, RCA 20%     . CARDIAC CATHETERIZATION N/A 10/23/2014   Procedure: Coronary Stent Intervention;  Surgeon: Corky CraftsJayadeep S Varanasi, MD; 2.5 x 20 Synergy DES to the LAD    . CARDIAC CATHETERIZATION  11/01/2014  . CARDIAC CATHETERIZATION N/A 11/01/2014   Procedure: Left Heart Cath and Coronary Angiography;  Surgeon: Lennette Biharihomas A Kelly, MD;  [facility];  Service: Cardiovascular;  Laterality: N/A;  . CORONARY ANGIOPLASTY WITH STENT PLACEMENT  10/23/2014   2.5 x 20 Synergy DES to the LAD  . ESOPHAGOGASTRODUODENOSCOPY N/A 07/07/2015   Procedure: ESOPHAGOGASTRODUODENOSCOPY (EGD);  Surgeon: Charlott RakesVincent Schooler, MD;  [facility];  Service: Endoscopy;  Laterality: N/A;  . GIVENS CAPSULE STUDY N/A 07/09/2015   Procedure: GIVENS  CAPSULE STUDY;  Surgeon: Charlott RakesVincent Schooler, MD;  [facility];  Service: Endoscopy;  Laterality: N/A;  . TONSILLECTOMY    . TYMPANOPLASTY Left 1960's   "perforated eardrum"    Family History  Problem Relation Age of Onset  . Stroke Father   . Heart failure Mother   . Heart disease Mother        triple bypass   . Heart attack Sister [id number]       smoker      Social History   Socioeconomic History  . Marital status: Married    Spouse name: Gershon Cullriscilla   . Number of children: 2  . Years of education: Not on file  . Highest education level: Not on file  Occupational History  . Occupation: Tourist information centre managerManufacturing Assembler    Employer: [employer]  Social Needs  . Financial resource strain: Not on file  . Food insecurity    Worry: Not on file    Inability: Not on file  . Transportation needs    Medical: Not on file    Non-medical: Not on file  Tobacco Use  . Smoking status: Never Smoker  . Smokeless tobacco: Never Used  Substance and Sexual Activity  . Alcohol use: Yes    Comment: 11/01/2014 "maybe a beer q 2 wks"  . Drug use: No  . Sexual activity: Not Currently    Partners: Female  Lifestyle  . Physical activity  Days per week: Not on file    Minutes per session: Not on file  . Stress: Not on file  Relationships  . Social Musician on phone: Not on file    Gets together: Not on file    Attends religious service: Not on file    Active member of club or organization: Not on file    Attends meetings of clubs or organizations: Not on file    Relationship status: Not on file  . Intimate partner violence    Fear of current or ex partner: Not on file    Emotionally abused: Not on file    Physically abused: Not on file    Forced sexual activity: Not on file  Other Topics Concern  . Not on file  Social History Narrative  . Not on file    Outpatient Medications Prior to Visit  Medication Sig Dispense Refill  . aspirin 81 MG tablet Take 81 mg by mouth every  other day.    [name] atorvastatin (LIPITOR) 80 MG tablet TAKE 1 TABLET BY MOUTH ONCE DAILY AT 6PM 90 tablet 3  . Calcium Carb-Cholecalciferol 600-800 MG-UNIT TABS Take 1 tablet by mouth daily.     [name] GARLIC PO Take by mouth.    . Ginkgo Biloba 40 MG TABS Take 60 mg by mouth daily.     [name] lisinopril (ZESTRIL) 10 MG tablet Take 1 tablet (10 mg total) by mouth daily. LAST REFILL. MUST SCHEDULE/KEEP APPOINTMENT FOR REFILLS 90 tablet 0  . metoprolol tartrate (LOPRESSOR) 25 MG tablet Take 1/2 (one-half) tablet by mouth twice daily 30 tablet 1  . nitroGLYCERIN (NITROSTAT) 0.4 MG SL tablet DISSOLVE ONE TABLET UNDER THE TONGUE EVERY 5 MINUTES AS NEEDED FOR CHEST PAIN.  DO NOT EXCEED A TOTAL OF 3 DOSES IN 15 MINUTES 25 tablet 3  . ranitidine (ZANTAC) 75 MG tablet Take 75 mg by mouth daily.     No facility-administered medications prior to visit.     Allergies  Allergen Reactions  . Amoxicillin Hives    ROS Review of Systems is negative.      Objective:    Physical Exam  Constitutional: He is oriented to person, place, and time. He appears well-developed and well-nourished.  HENT:  Head: Normocephalic and atraumatic.  Right Ear: External ear normal.  Left Ear: External ear normal.  Nose: Nose normal.  Mouth/Throat: Oropharynx is clear and moist.  Eyes: Pupils are equal, round, and reactive to light. Conjunctivae and EOM are normal.  Neck: Normal range of motion. Neck supple. No thyromegaly present.  Cardiovascular: Normal rate, regular rhythm, normal heart sounds and intact distal pulses.  Pulmonary/Chest: Effort normal and breath sounds normal.  Abdominal: Soft. Bowel sounds are normal. He exhibits no distension and no mass. There is no abdominal tenderness. There is no rebound and no guarding.  Musculoskeletal: Normal range of motion.  Lymphadenopathy:    He has no cervical adenopathy.  Neurological: He is alert and oriented to person, place, and time. He has normal reflexes.  Skin: Skin is  warm and dry.  Solar lentigo on his scalp.  He is balding.   Psychiatric: He has a normal mood and affect. His behavior is normal. Judgment and thought content normal.    BP 129/65   Pulse 66   Ht 5\' 10"  (1.778 m)   Wt 191 lb (86.6 kg)   SpO2 99%   BMI 27.41 kg/m  Wt Readings from Last 3 Encounters:  12/31/18  191 lb (86.6 kg)  05/04/18 200 lb (90.7 kg)  12/25/17 200 lb (90.7 kg)     Health Maintenance Due  Topic Date Due  . Hepatitis C Screening  Jul 24, 1955  . HIV Screening  09/06/1970    There are no preventive care reminders to display for this patient.  Lab Results  Component Value Date   TSH 1.82 05/23/2016   Lab Results  Component Value Date   WBC 8.4 12/25/2017   HGB 14.7 12/25/2017   HCT 43.3 12/25/2017   MCV 92.1 12/25/2017   PLT 229 12/25/2017   Lab Results  Component Value Date   NA 137 12/25/2017   K 5.1 12/25/2017   CO2 26 12/25/2017   GLUCOSE 87 12/25/2017   BUN 16 12/25/2017   CREATININE 1.26 (H) 12/25/2017   BILITOT 0.9 12/25/2017   ALKPHOS 71 05/23/2016   AST 34 12/25/2017   ALT 37 12/25/2017   PROT 6.6 12/25/2017   ALBUMIN 4.2 05/23/2016   CALCIUM 9.9 12/25/2017   ANIONGAP 6 07/10/2015   Lab Results  Component Value Date   CHOL 101 12/25/2017   Lab Results  Component Value Date   HDL 52 12/25/2017   Lab Results  Component Value Date   LDLCALC 36 12/25/2017   Lab Results  Component Value Date   TRIG 57 12/25/2017   Lab Results  Component Value Date   CHOLHDL 1.9 12/25/2017   Lab Results  Component Value Date   HGBA1C 5.6 10/24/2014      Assessment & Plan:   Problem List Items Addressed This Visit    None    Visit Diagnoses    Wellness examination    -  Primary   Relevant Orders   PSA   Lipid panel   CBC   COMPLETE METABOLIC PANEL WITH GFR   Hepatitis C Antibody     Keep up a regular exercise program and make sure you are eating a healthy diet Try to eat 4 servings of dairy a day, or if you are lactose  intolerant take a calcium with vitamin D daily.  Your vaccines are up to date.    No orders of the defined types were placed in this encounter.   Follow-up: Return in about 6 months (around 07/03/2019) for Hypertension.    Beatrice Lecher, MD

## 2018-12-31 NOTE — Patient Instructions (Signed)
Preventive Care 40-64 Years Old, Male Preventive care refers to lifestyle choices and visits with your health care provider that can promote health and wellness. This includes:  A yearly physical exam. This is also called an annual well check.  Regular dental and eye exams.  Immunizations.  Screening for certain conditions.  Healthy lifestyle choices, such as eating a healthy diet, getting regular exercise, not using drugs or products that contain nicotine and tobacco, and limiting alcohol use. What can I expect for my preventive care visit? Physical exam Your health care provider will check:  Height and weight. These may be used to calculate body mass index (BMI), which is a measurement that tells if you are at a healthy weight.  Heart rate and blood pressure.  Your skin for abnormal spots. Counseling Your health care provider may ask you questions about:  Alcohol, tobacco, and drug use.  Emotional well-being.  Home and relationship well-being.  Sexual activity.  Eating habits.  Work and work environment. What immunizations do I need?  Influenza (flu) vaccine  This is recommended every year. Tetanus, diphtheria, and pertussis (Tdap) vaccine  You may need a Td booster every 10 years. Varicella (chickenpox) vaccine  You may need this vaccine if you have not already been vaccinated. Zoster (shingles) vaccine  You may need this after age 60. Measles, mumps, and rubella (MMR) vaccine  You may need at least one dose of MMR if you were born in 1957 or later. You may also need a second dose. Pneumococcal conjugate (PCV13) vaccine  You may need this if you have certain conditions and were not previously vaccinated. Pneumococcal polysaccharide (PPSV23) vaccine  You may need one or two doses if you smoke cigarettes or if you have certain conditions. Meningococcal conjugate (MenACWY) vaccine  You may need this if you have certain conditions. Hepatitis A vaccine   You may need this if you have certain conditions or if you travel or work in places where you may be exposed to hepatitis A. Hepatitis B vaccine  You may need this if you have certain conditions or if you travel or work in places where you may be exposed to hepatitis B. Haemophilus influenzae type b (Hib) vaccine  You may need this if you have certain risk factors. Human papillomavirus (HPV) vaccine  If recommended by your health care provider, you may need three doses over 6 months. You may receive vaccines as individual doses or as more than one vaccine together in one shot (combination vaccines). Talk with your health care provider about the risks and benefits of combination vaccines. What tests do I need? Blood tests  Lipid and cholesterol levels. These may be checked every 5 years, or more frequently if you are over 50 years old.  Hepatitis C test.  Hepatitis B test. Screening  Lung cancer screening. You may have this screening every year starting at age 55 if you have a 30-pack-year history of smoking and currently smoke or have quit within the past 15 years.  Prostate cancer screening. Recommendations will vary depending on your family history and other risks.  Colorectal cancer screening. All adults should have this screening starting at age 50 and continuing until age 75. Your health care provider may recommend screening at age 45 if you are at increased risk. You will have tests every 1-10 years, depending on your results and the type of screening test.  Diabetes screening. This is done by checking your blood sugar (glucose) after you have not eaten   for a while (fasting). You may have this done every 1-3 years.  Sexually transmitted disease (STD) testing. Follow these instructions at home: Eating and drinking  Eat a diet that includes fresh fruits and vegetables, whole grains, lean protein, and low-fat dairy products.  Take vitamin and mineral supplements as recommended  by your health care provider.  Do not drink alcohol if your health care provider tells you not to drink.  If you drink alcohol: ? Limit how much you have to 0-2 drinks a day. ? Be aware of how much alcohol is in your drink. In the [place] one drink equals one 12 oz bottle of beer (355 mL), one 5 oz glass of wine (148 mL), or one 1 oz glass of hard liquor (44 mL). Lifestyle  Take daily care of your teeth and gums.  Stay active. Exercise for at least 30 minutes on 5 or more days each week.  Do not use any products that contain nicotine or tobacco, such as cigarettes, e-cigarettes, and chewing tobacco. If you need help quitting, ask your health care provider.  If you are sexually active, practice safe sex. Use a condom or other form of protection to prevent STIs (sexually transmitted infections).  Talk with your health care provider about taking a low-dose aspirin every day starting at age 33. What's next?  Go to your health care provider once a year for a well check visit.  Ask your health care provider how often you should have your eyes and teeth checked.  Stay up to date on all vaccines. This information is not intended to replace advice given to you by your health care provider. Make sure you discuss any questions you have with your health care provider. Document Released: 05/18/2015 Document Revised: 04/15/2018 Document Reviewed: 04/15/2018 Elsevier Patient Education  2020 [employer].

## 2019-01-03 LAB — CBC
HCT: 42.8 % (ref 38.5–50.0)
Hemoglobin: 14.2 g/dL (ref 13.2–17.1)
MCH: 31.1 pg (ref 27.0–33.0)
MCHC: 33.2 g/dL (ref 32.0–36.0)
MCV: 93.9 fL (ref 80.0–100.0)
MPV: 10.3 fL (ref 7.5–12.5)
Platelets: 215 10*3/uL (ref 140–400)
RBC: 4.56 10*6/uL (ref 4.20–5.80)
RDW: 12.2 % (ref 11.0–15.0)
WBC: 9.2 10*3/uL (ref 3.8–10.8)

## 2019-01-03 LAB — COMPLETE METABOLIC PANEL WITH GFR
AG Ratio: 2.1 (calc) (ref 1.0–2.5)
ALT: 30 U/L (ref 9–46)
AST: 30 U/L (ref 10–35)
Albumin: 4.6 g/dL (ref 3.6–5.1)
Alkaline phosphatase (APISO): 82 U/L (ref 35–144)
BUN/Creatinine Ratio: 22 (calc) (ref 6–22)
BUN: 30 mg/dL — ABNORMAL HIGH (ref 7–25)
CO2: 26 mmol/L (ref 20–32)
Calcium: 9.8 mg/dL (ref 8.6–10.3)
Chloride: 106 mmol/L (ref 98–110)
Creat: 1.38 mg/dL — ABNORMAL HIGH (ref 0.70–1.25)
GFR, Est African American: 63 mL/min/{1.73_m2} (ref >=60)
GFR, Est Non African American: 54 mL/min/{1.73_m2} — ABNORMAL LOW (ref >=60)
Globulin: 2.2 g/dL (calc) (ref 1.9–3.7)
Glucose, Bld: 84 mg/dL (ref 65–99)
Potassium: 4.4 mmol/L (ref 3.5–5.3)
Sodium: 141 mmol/L (ref 135–146)
Total Bilirubin: 1.1 mg/dL (ref 0.2–1.2)
Total Protein: 6.8 g/dL (ref 6.1–8.1)

## 2019-01-03 LAB — LIPID PANEL
Cholesterol: 108 mg/dL (ref <200)
HDL: 54 mg/dL (ref >=40)
LDL Cholesterol (Calc): 40 mg/dL (calc)
Non-HDL Cholesterol (Calc): 54 mg/dL (calc) (ref <130)
Total CHOL/HDL Ratio: 2 (calc) (ref <5.0)
Triglycerides: 60 mg/dL (ref <150)

## 2019-01-03 LAB — HEPATITIS C ANTIBODY
Hepatitis C Ab: NONREACTIVE
SIGNAL TO CUT-OFF: 0.02 (ref <1.00)

## 2019-01-03 LAB — PSA: PSA: 2.8 ng/mL (ref <=4.0)

## 2019-02-04 ENCOUNTER — Other Ambulatory Visit: Payer: Self-pay | Admitting: Family Medicine

## 2019-04-22 ENCOUNTER — Other Ambulatory Visit: Payer: Self-pay | Admitting: Family Medicine

## 2019-05-23 ENCOUNTER — Other Ambulatory Visit: Payer: Self-pay

## 2019-05-23 ENCOUNTER — Encounter: Payer: Self-pay | Admitting: Family Medicine

## 2019-05-23 ENCOUNTER — Ambulatory Visit (INDEPENDENT_AMBULATORY_CARE_PROVIDER_SITE_OTHER): Payer: BC Managed Care – PPO | Admitting: Family Medicine

## 2019-05-23 VITALS — BP 136/79 | HR 75 | Ht 70.0 in | Wt 198.0 lb

## 2019-05-23 DIAGNOSIS — R202 Paresthesia of skin: Secondary | ICD-10-CM | POA: Diagnosis not present

## 2019-05-23 DIAGNOSIS — L84 Corns and callosities: Secondary | ICD-10-CM | POA: Diagnosis not present

## 2019-05-23 DIAGNOSIS — M25441 Effusion, right hand: Secondary | ICD-10-CM

## 2019-05-23 MED ORDER — DOXYCYCLINE HYCLATE 100 MG PO TABS: 100.0000 mg | ORAL_TABLET | Freq: Two times a day (BID) | ORAL | 0 refills | Status: DC

## 2019-05-23 MED ORDER — TRIAMCINOLONE ACETONIDE 0.5 % EX OINT: 1.0000 "application " | TOPICAL_OINTMENT | Freq: Every evening | CUTANEOUS | 0 refills | Status: DC | PRN

## 2019-05-23 NOTE — Progress Notes (Signed)
Established Patient Office Visit  Subjective:  Patient ID: Lee Taylor, male    DOB: 03-17-1956  Age: 64 y.o. MRN: [id number]  CC:  Chief Complaint  Patient presents with  . Hand Pain  . hand swelling    HPI Lee Taylor presents for bilateral hand pain.   He is right-handed and on his right index finger on the tip he has started getting a callus formation over the last couple of weeks.  He did pick at it and remove the skin and he says it was actively cracked and bleeding.  Then on Saturday morning he woke up and noticed that the finger was starting to look red and a little bit swollen compared to the rest of his fingers.  He also noted that that finger and his right middle finger as well as his left index finger started to feel like he was experiencing some pins-and-needles sensation.  He denies any injury or trauma.  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  He denies any neck or shoulder pain.  Past Medical History:  Diagnosis Date  . Acute kidney injury (HCC) 10/23/2014   "related to the MI"  . Coronary artery disease 10/2014   a. cath 10/23/2014 2.5 x 20 Synergy DES to the LAD, EF 40-45 percent   b. relook cath 6/29, patent stents, medical management  . GERD (gastroesophageal reflux disease)   . Hypercholesterolemia   . Hypertension   . Kidney stones    "passed it"  . NSTEMI (non-ST elevated myocardial infarction) (HCC) 10/20/2014   2.5 x 20 Synergy DES to the LAD, EF 40-45 percent    Past Surgical History:  Procedure Laterality Date  . CARDIAC CATHETERIZATION N/A 10/23/2014   Procedure: Left Heart Cath and Coronary Angiography;  Surgeon: Corky Crafts, MD; LAD 95%, thrombotic, small ramus intermedius, CFX and OM branch is patent, RCA 20%     . CARDIAC CATHETERIZATION N/A 10/23/2014   Procedure: Coronary Stent Intervention;  Surgeon: Corky Crafts, MD; 2.5 x 20 Synergy DES to the LAD    .  CARDIAC CATHETERIZATION  11/01/2014  . CARDIAC CATHETERIZATION N/A 11/01/2014   Procedure: Left Heart Cath and Coronary Angiography;  Surgeon: Lennette Bihari, MD;  Location: [facility] INVASIVE CV LAB;  Service: Cardiovascular;  Laterality: N/A;  . CORONARY ANGIOPLASTY WITH STENT PLACEMENT  10/23/2014   2.5 x 20 Synergy DES to the LAD  . ESOPHAGOGASTRODUODENOSCOPY N/A 07/07/2015   Procedure: ESOPHAGOGASTRODUODENOSCOPY (EGD);  Surgeon: Charlott Rakes, MD;  [facility] ENDOSCOPY;  Service: Endoscopy;  Laterality: N/A;  . GIVENS CAPSULE STUDY N/A 07/09/2015   Procedure: GIVENS CAPSULE STUDY;  Surgeon: Charlott Rakes, MD;  Location: [facility] ENDOSCOPY;  Service: Endoscopy;  Laterality: N/A;  . TONSILLECTOMY    . TYMPANOPLASTY Left 1960's   "perforated eardrum"    Family History  Problem Relation Age of Onset  . Stroke Father   . Heart failure Mother   . Heart disease Mother        triple bypass   . Heart attack Sister 66       smoker      Social History   Socioeconomic History  . Marital status: Married    Spouse name: [name]   . Number of children: 2  . Years of education: Not on file  . Highest education level: Not on file  Occupational History  . Occupation: Museum/gallery exhibitions officer  Employer: [employer]  Tobacco Use  . Smoking status: Never Smoker  . Smokeless tobacco: Never Used  Substance and Sexual Activity  . Alcohol use: Yes    Comment: 11/01/2014 "maybe a beer q 2 wks"  . Drug use: No  . Sexual activity: Not Currently    Partners: Female  Other Topics Concern  . Not on file  Social History Narrative  . Not on file   Social Determinants of Health   Financial Resource Strain:   . Difficulty of Paying Living Expenses: Not on file  Food Insecurity:   . Worried About Charity fundraiser in the Last Year: Not on file  . Ran Out of Food in the Last Year: Not on file  Transportation Needs:   . Lack of Transportation (Medical): Not on file  . Lack of Transportation  (Non-Medical): Not on file  Physical Activity:   . Days of Exercise per Week: Not on file  . Minutes of Exercise per Session: Not on file  Stress:   . Feeling of Stress : Not on file  Social Connections:   . Frequency of Communication with Friends and Family: Not on file  . Frequency of Social Gatherings with Friends and Family: Not on file  . Attends Religious Services: Not on file  . Active Member of Clubs or Organizations: Not on file  . Attends Archivist Meetings: Not on file  . Marital Status: Not on file  Intimate Partner Violence:   . Fear of Current or Ex-Partner: Not on file  . Emotionally Abused: Not on file  . Physically Abused: Not on file  . Sexually Abused: Not on file    Outpatient Medications Prior to Visit  Medication Sig Dispense Refill  . aspirin 81 MG tablet Take 81 mg by mouth every other day.    [name] atorvastatin (LIPITOR) 80 MG tablet TAKE 1 TABLET BY MOUTH ONCE DAILY AT 6PM 90 tablet 3  . Calcium Carb-Cholecalciferol 600-800 MG-UNIT TABS Take 1 tablet by mouth daily.     [name] GARLIC PO Take by mouth.    . Ginkgo Biloba 40 MG TABS Take 60 mg by mouth daily.     [name] lisinopril (ZESTRIL) 10 MG tablet Take 1 tablet (10 mg total) by mouth daily. [place].LABS AND APPOINTMENT REQUIRED FOR REFILLS 30 tablet 0  . metoprolol tartrate (LOPRESSOR) 25 MG tablet Take 1/2 (one-half) tablet by mouth twice daily 90 tablet 1  . nitroGLYCERIN (NITROSTAT) 0.4 MG SL tablet DISSOLVE ONE TABLET UNDER THE TONGUE EVERY 5 MINUTES AS NEEDED FOR CHEST PAIN.  DO NOT EXCEED A TOTAL OF 3 DOSES IN 15 MINUTES 25 tablet 3  . raNITIdine HCl (RANITIDINE 75 PO) Take 75 mg by mouth daily.    . ranitidine (ZANTAC) 75 MG tablet Take 75 mg by mouth daily.     No facility-administered medications prior to visit.    Allergies  Allergen Reactions  . Amoxicillin Hives    ROS Review of Systems    Objective:    Physical Exam  Constitutional: He is oriented to person, place,  and time. He appears well-developed and well-nourished.  HENT:  Head: Normocephalic and atraumatic.  Eyes: Conjunctivae and EOM are normal.  Cardiovascular: Normal rate.  Pulmonary/Chest: Effort normal.  Neurological: He is alert and oriented to person, place, and time.  Wrist with normal range of motion.  Negative Tinel's and Phalen sign.  Skin: Skin is dry. No pallor.  Callus on the right index  finger at the tip.  There is some erythema posteriorly near the base of the nail going towards the PIP joint.  There is also a little bit swollen compared to his opposite hand.  Psychiatric: He has a normal mood and affect. His behavior is normal.  Vitals reviewed.   BP 136/79   Pulse 75   Ht 5\' 10"  (1.778 m)   Wt 198 lb (89.8 kg)   SpO2 100%   BMI 28.41 kg/m  Wt Readings from Last 3 Encounters:  05/23/19 198 lb (89.8 kg)  12/31/18 191 lb (86.6 kg)  05/04/18 200 lb (90.7 kg)     There are no preventive care reminders to display for this patient.  There are no preventive care reminders to display for this patient.  Lab Results  Component Value Date   TSH 1.82 05/23/2016   Lab Results  Component Value Date   WBC 9.2 12/31/2018   HGB 14.2 12/31/2018   HCT 42.8 12/31/2018   MCV 93.9 12/31/2018   PLT 215 12/31/2018   Lab Results  Component Value Date   NA 141 12/31/2018   K 4.4 12/31/2018   CO2 26 12/31/2018   GLUCOSE 84 12/31/2018   BUN 30 (H) 12/31/2018   CREATININE 1.38 (H) 12/31/2018   BILITOT 1.1 12/31/2018   ALKPHOS 71 05/23/2016   AST 30 12/31/2018   ALT 30 12/31/2018   PROT 6.8 12/31/2018   ALBUMIN 4.2 05/23/2016   CALCIUM 9.8 12/31/2018   ANIONGAP 6 07/10/2015   Lab Results  Component Value Date   CHOL 108 12/31/2018   Lab Results  Component Value Date   HDL 54 12/31/2018   Lab Results  Component Value Date   LDLCALC 40 12/31/2018   Lab Results  Component Value Date   TRIG 60 12/31/2018   Lab Results  Component Value Date   CHOLHDL 2.0  12/31/2018   Lab Results  Component Value Date   HGBA1C 5.6 10/24/2014      Assessment & Plan:   Problem List Items Addressed This Visit    None    Visit Diagnoses    Callus    -  Primary   Finger joint swelling, right       Paresthesia of both hands         Callus-unclear what may be triggering the callus.  He says has been doing the same work for quite some time nothing is really changed.  We discussed using a good moisturizer such as CeraVe or Eucerin and I did give him a small amount of topical mild steroid to use as needed.  Right index finger swelling and erythema-possible early cellulitis especially since he had been peeling the skin off.  We will go ahead and treat with oral doxycycline.  To cover for staph.  Paresthesias of fingers in the median nerve distribution-discussed that this could be consistent with carpal tunnel syndrome.  Negative Tinel's and Phalen sign today.  Recommend a trial cock-up if not improving on its own.  We will plan to see him back if the splints are not helpful after couple of weeks.   Meds ordered this encounter  Medications  . doxycycline (VIBRA-TABS) 100 MG tablet    Sig: Take 1 tablet (100 mg total) by mouth 2 (two) times daily.    Dispense:  20 tablet    Refill:  0  . triamcinolone ointment (KENALOG) 0.5 %    Sig: Apply 1 application topically at bedtime as needed. To affected  skin    Dispense:  30 g    Refill:  0    Follow-up: Return if symptoms worsen or fail to improve.    Nani Gasser, MD

## 2019-05-23 NOTE — Patient Instructions (Signed)
If for any reason the right finger is not improving on the antibiotics please let us know or if you develop increased swelling redness or pain please let us know.  Regards the numbness and tingling if that does not resolve in the next week then recommend a trial of wrist braces bilaterally to be worn only at night while sleeping to see if this helps after a few weeks.

## 2019-06-08 ENCOUNTER — Telehealth: Payer: Self-pay

## 2019-06-08 NOTE — Telephone Encounter (Signed)
Nithin's wife called and reports he has a possible exposure of COVID-19 at work. He is going to go get tested with [facility] or [facility]. No symptoms.

## 2019-06-08 NOTE — Telephone Encounter (Signed)
Agree with documentation as above.   Torianne Laflam, MD  

## 2019-06-29 ENCOUNTER — Other Ambulatory Visit: Payer: Self-pay | Admitting: Family Medicine

## 2019-08-09 ENCOUNTER — Encounter: Payer: Self-pay | Admitting: Family Medicine

## 2019-08-09 ENCOUNTER — Ambulatory Visit: Payer: BC Managed Care – PPO | Admitting: Sports Medicine

## 2019-08-09 ENCOUNTER — Other Ambulatory Visit: Payer: Self-pay

## 2019-08-09 ENCOUNTER — Ambulatory Visit (INDEPENDENT_AMBULATORY_CARE_PROVIDER_SITE_OTHER): Payer: BC Managed Care – PPO

## 2019-08-09 ENCOUNTER — Ambulatory Visit (INDEPENDENT_AMBULATORY_CARE_PROVIDER_SITE_OTHER): Payer: BC Managed Care – PPO | Admitting: Family Medicine

## 2019-08-09 VITALS — BP 123/73 | HR 78 | Ht 70.0 in | Wt 198.0 lb

## 2019-08-09 DIAGNOSIS — R21 Rash and other nonspecific skin eruption: Secondary | ICD-10-CM

## 2019-08-09 DIAGNOSIS — M79671 Pain in right foot: Secondary | ICD-10-CM

## 2019-08-09 DIAGNOSIS — M722 Plantar fascial fibromatosis: Secondary | ICD-10-CM

## 2019-08-09 DIAGNOSIS — M7731 Calcaneal spur, right foot: Secondary | ICD-10-CM | POA: Diagnosis not present

## 2019-08-09 MED ORDER — CLOTRIMAZOLE-BETAMETHASONE 1-0.05 % EX CREA: 1.0000 "application " | TOPICAL_CREAM | Freq: Two times a day (BID) | CUTANEOUS | 0 refills | Status: DC | PRN

## 2019-08-09 NOTE — Patient Instructions (Signed)
Your heel pain is not improving over the next 3 weeks with the exercises and stretches then recommend follow-up with Dr. Benjamin Stain for possible plantar fascia injection.

## 2019-08-09 NOTE — Progress Notes (Signed)
Acute Office Visit  Subjective:    Patient ID: Lee Taylor, male    DOB: 1955-09-04, 64 y.o.   MRN: [id number]  Chief Complaint  Patient presents with  . Foot Pain    R heel  . Callouses    R index and thumb    HPI Patient is in today for cracking of fingers. Last seen 3 mo ago for thi.  Told him to stop picking at it and use a steroid cram and moisturizer.  He says he is not sure that the steroid really helped.  He says it will heal up completely but then seems to come right back.  He does use chemicals at work but says he wears a glove and then another glove on top of that.  Double gloves do have latex.  He says interestingly the fingers will actually feel numb and then a couple days later is when he starts to notice the peeling and the cracking.  Also c/o of right heel pain.  Sometimes radiates.  Causes him to limp at time.  He has not really tried any specific treatments he denies any numbness or tingling it is never kept him awake at night but he notices that if he dorsiflexes the foot it is more painful and if he first tries to put his weight on it in the morning on a getting bed that he will limp.  He did get a new pair of work boots back in the fall.  Past Medical History:  Diagnosis Date  . Acute kidney injury (HCC) 10/23/2014   "related to the MI"  . Coronary artery disease 10/2014   a. cath 10/23/2014 2.5 x 20 Synergy DES to the LAD, EF 40-45 percent   b. relook cath 6/29, patent stents, medical management  . GERD (gastroesophageal reflux disease)   . Hypercholesterolemia   . Hypertension   . Kidney stones    "passed it"  . NSTEMI (non-ST elevated myocardial infarction) (HCC) 10/20/2014   2.5 x 20 Synergy DES to the LAD, EF 40-45 percent    Past Surgical History:  Procedure Laterality Date  . CARDIAC CATHETERIZATION N/A 10/23/2014   Procedure: Left Heart Cath and Coronary Angiography;  Surgeon: Corky Crafts, MD; LAD 95%, thrombotic, small ramus intermedius, CFX  and OM branch is patent, RCA 20%     . CARDIAC CATHETERIZATION N/A 10/23/2014   Procedure: Coronary Stent Intervention;  Surgeon: Corky Crafts, MD; 2.5 x 20 Synergy DES to the LAD    . CARDIAC CATHETERIZATION  11/01/2014  . CARDIAC CATHETERIZATION N/A 11/01/2014   Procedure: Left Heart Cath and Coronary Angiography;  Surgeon: Lennette Bihari, MD;  Location: [facility] INVASIVE CV LAB;  Service: Cardiovascular;  Laterality: N/A;  . CORONARY ANGIOPLASTY WITH STENT PLACEMENT  10/23/2014   2.5 x 20 Synergy DES to the LAD  . ESOPHAGOGASTRODUODENOSCOPY N/A 07/07/2015   Procedure: ESOPHAGOGASTRODUODENOSCOPY (EGD);  Surgeon: Charlott Rakes, MD;  Location: [facility] ENDOSCOPY;  Service: Endoscopy;  Laterality: N/A;  . GIVENS CAPSULE STUDY N/A 07/09/2015   Procedure: GIVENS CAPSULE STUDY;  Surgeon: Charlott Rakes, MD;  Location: [facility] ENDOSCOPY;  Service: Endoscopy;  Laterality: N/A;  . TONSILLECTOMY    . TYMPANOPLASTY Left 1960's   "perforated eardrum"    Family History  Problem Relation Age of Onset  . Stroke Father   . Heart failure Mother   . Heart disease Mother        triple bypass   . Heart attack Sister 71  smoker      Social History   Socioeconomic History  . Marital status: Married    Spouse name: Lannette Donath   . Number of children: 2  . Years of education: Not on file  . Highest education level: Not on file  Occupational History  . Occupation: Education officer, museum: [employer] Use  . Smoking status: Never Smoker  . Smokeless tobacco: Never Used  Substance and Sexual Activity  . Alcohol use: Yes    Comment: 11/01/2014 "maybe a beer q 2 wks"  . Drug use: No  . Sexual activity: Not Currently    Partners: Female  Other Topics Concern  . Not on file  Social History Narrative  . Not on file   Social Determinants of Health   Financial Resource Strain:   . Difficulty of Paying Living Expenses:   Food Insecurity:   . Worried About Charity fundraiser in the Last  Year:   . Arboriculturist in the Last Year:   Transportation Needs:   . Film/video editor (Medical):   [name] Lack of Transportation (Non-Medical):   Physical Activity:   . Days of Exercise per Week:   . Minutes of Exercise per Session:   Stress:   . Feeling of Stress :   Social Connections:   . Frequency of Communication with Friends and Family:   . Frequency of Social Gatherings with Friends and Family:   . Attends Religious Services:   . Active Member of Clubs or Organizations:   . Attends Archivist Meetings:   [name] Marital Status:   Intimate Partner Violence:   . Fear of Current or Ex-Partner:   . Emotionally Abused:   [name] Physically Abused:   . Sexually Abused:     Outpatient Medications Prior to Visit  Medication Sig Dispense Refill  . aspirin 81 MG tablet Take 81 mg by mouth every other day.    [name] atorvastatin (LIPITOR) 80 MG tablet TAKE 1 TABLET BY MOUTH ONCE DAILY AT 6PM 90 tablet 3  . Calcium Carb-Cholecalciferol 600-800 MG-UNIT TABS Take 1 tablet by mouth daily.     [name] GARLIC PO Take by mouth.    . Ginkgo Biloba 40 MG TABS Take 60 mg by mouth daily.     [name] lisinopril (ZESTRIL) 10 MG tablet Take 1 tablet (10 mg total) by mouth daily. (Patient taking differently: Take 10 mg by mouth daily. Patient reports taking 5 mg in the mornings) 90 tablet 0  . metoprolol tartrate (LOPRESSOR) 25 MG tablet Take 1/2 (one-half) tablet by mouth twice daily 90 tablet 1  . nitroGLYCERIN (NITROSTAT) 0.4 MG SL tablet DISSOLVE ONE TABLET UNDER THE TONGUE EVERY 5 MINUTES AS NEEDED FOR CHEST PAIN.  DO NOT EXCEED A TOTAL OF 3 DOSES IN 15 MINUTES 25 tablet 3  . raNITIdine HCl (RANITIDINE 75 PO) Take 75 mg by mouth daily.    [name] triamcinolone ointment (KENALOG) 0.5 % Apply 1 application topically at bedtime as needed. To affected skin 30 g 0   No facility-administered medications prior to visit.    Allergies  Allergen Reactions  . Amoxicillin Hives    Review of Systems     Objective:     Physical Exam Vitals reviewed.  Constitutional:      Appearance: He is well-developed.  HENT:     Head: Normocephalic and atraumatic.  Eyes:     Conjunctiva/sclera: Conjunctivae normal.  Cardiovascular:     Rate and  Rhythm: Normal rate.  Pulmonary:     Effort: Pulmonary effort is normal.  Musculoskeletal:     Comments: Right heel with some tenderness at the base as well as a little bit medially.  He has pain with dorsiflexion.  Nontender over the arch or the distal metatarsals.  No increased laxity of the ankle normal range of motion.  No swelling or rash of the foot.  Skin:    General: Skin is dry.     Coloration: Skin is not pale.     Comments: The right hand on the tip of the thumb and index finger there is some peeling of the skin and cracking.   Neurological:     Mental Status: He is alert and oriented to person, place, and time.  Psychiatric:        Behavior: Behavior normal.     BP 123/73   Pulse 78   Ht 5\' 10"  (1.778 m)   Wt 198 lb (89.8 kg)   SpO2 99%   BMI 28.41 kg/m  Wt Readings from Last 3 Encounters:  08/09/19 198 lb (89.8 kg)  05/23/19 198 lb (89.8 kg)  12/31/18 191 lb (86.6 kg)    There are no preventive care reminders to display for this patient.  There are no preventive care reminders to display for this patient.   Lab Results  Component Value Date   TSH 1.82 05/23/2016   Lab Results  Component Value Date   WBC 9.2 12/31/2018   HGB 14.2 12/31/2018   HCT 42.8 12/31/2018   MCV 93.9 12/31/2018   PLT 215 12/31/2018   Lab Results  Component Value Date   NA 141 12/31/2018   K 4.4 12/31/2018   CO2 26 12/31/2018   GLUCOSE 84 12/31/2018   BUN 30 (H) 12/31/2018   CREATININE 1.38 (H) 12/31/2018   BILITOT 1.1 12/31/2018   ALKPHOS 71 05/23/2016   AST 30 12/31/2018   ALT 30 12/31/2018   PROT 6.8 12/31/2018   ALBUMIN 4.2 05/23/2016   CALCIUM 9.8 12/31/2018   ANIONGAP 6 07/10/2015   Lab Results  Component Value Date   CHOL 108 12/31/2018    Lab Results  Component Value Date   HDL 54 12/31/2018   Lab Results  Component Value Date   LDLCALC 40 12/31/2018   Lab Results  Component Value Date   TRIG 60 12/31/2018   Lab Results  Component Value Date   CHOLHDL 2.0 12/31/2018   Lab Results  Component Value Date   HGBA1C 5.6 10/24/2014       Assessment & Plan:   Problem List Items Addressed This Visit    None    Visit Diagnoses    Pain of right heel    -  Primary   Relevant Orders   DG Foot Complete Right   Plantar fasciitis       Rash of multiple fingers of both hands       Relevant Medications   clotrimazole-betamethasone (LOTRISONE) cream     Rash -improved with topical steroid.  Consider that it could be an allergic reaction to latex.  Encouraged him to try latex free gloves for couple weeks if he notices a different.  Right heel pain -most consistent with plantar fasciitis given his description of his pain.  Though his pain is a little bit more medial than I would typically suspect.  Recommend treatment for plantar fasciitis.  Recommend icing massage and gentle stretches.  Handout provided.  If he is not  improving over the next 3 weeks then follow-up with Dr. Benjamin Stain in about 3 weeks for possible injection.  We also discussed heel cups as an option.  Meds ordered this encounter  Medications  . clotrimazole-betamethasone (LOTRISONE) cream    Sig: Apply 1 application topically 2 (two) times daily as needed.    Dispense:  45 g    Refill:  0     Nani Gasser, MD

## 2019-08-12 ENCOUNTER — Other Ambulatory Visit: Payer: Self-pay | Admitting: Family Medicine

## 2019-08-12 NOTE — Progress Notes (Signed)
Call pt:   He does have a bone spur at achilles insertion. It does not sound like he is having pain over this area. Likely has plantar fasciitis as well. Keep treatment plan the same. Follow up with Dr. Karie Schwalbe as Dr. Linford Arnold discussed. No other acute findings.

## 2019-09-02 ENCOUNTER — Other Ambulatory Visit: Payer: Self-pay

## 2019-09-02 ENCOUNTER — Ambulatory Visit (INDEPENDENT_AMBULATORY_CARE_PROVIDER_SITE_OTHER): Payer: BC Managed Care – PPO | Admitting: Sports Medicine

## 2019-09-02 ENCOUNTER — Encounter: Payer: Self-pay | Admitting: Sports Medicine

## 2019-09-02 DIAGNOSIS — M722 Plantar fascial fibromatosis: Secondary | ICD-10-CM | POA: Diagnosis not present

## 2019-09-02 NOTE — Progress Notes (Signed)
    Procedures performed today:    Procedure: Real-time Ultrasound Guided injection of the right plantar fascial origin Device: Samsung [id number]  Verbal informed consent obtained.  Time-out conducted.  Noted no overlying erythema, induration, or other signs of local infection.  Skin prepped in a sterile fashion.  Local anesthesia: Topical Ethyl chloride.  With sterile technique and under real time ultrasound guidance: 1 cc Kenalog 40, 1 cc lidocaine, 1 cc bupivacaine injected easily Completed without difficulty  Pain immediately resolved suggesting accurate placement of the medication.  Advised to call if fevers/chills, erythema, induration, drainage, or persistent bleeding.  Images permanently stored and available for review in the ultrasound unit.  Impression: Technically successful ultrasound guided injection.  Independent interpretation of notes and tests performed by another provider:   None.  Brief History, Exam, Impression, and Recommendations:    Plantar fasciitis, right This is a very pleasant 64 year old male, for several months has had pain on the plantar aspect of his heel, sometimes worse with the first few steps in the morning, he did see his PCP, he was appropriately treated with conservative treatment including NSAIDs, rehabilitation exercises. Unfortunately he has continued to have discomfort. Today we injected his plantar fascia. He will continue his rehab exercises, he does have some orthotics in his shoes. We could certainly add the air heel brace and nighttime splinting if this fails.    ___________________________________________ Ihor Austin. Benjamin Stain, M.D., ABFM., CAQSM. Primary Care and Sports Medicine [facility]  Adjunct Instructor of Family Medicine  [employer]

## 2019-09-02 NOTE — Assessment & Plan Note (Signed)
This is a very pleasant 64 year old male, for several months has had pain on the plantar aspect of his heel, sometimes worse with the first few steps in the morning, he did see his PCP, he was appropriately treated with conservative treatment including NSAIDs, rehabilitation exercises. Unfortunately he has continued to have discomfort. Today we injected his plantar fascia. He will continue his rehab exercises, he does have some orthotics in his shoes. We could certainly add the air heel brace and nighttime splinting if this fails.

## 2019-11-11 ENCOUNTER — Other Ambulatory Visit: Payer: Self-pay | Admitting: Family Medicine

## 2019-11-18 ENCOUNTER — Other Ambulatory Visit: Payer: Self-pay | Admitting: Family Medicine

## 2019-12-02 ENCOUNTER — Other Ambulatory Visit: Payer: Self-pay | Admitting: Family Medicine

## 2019-12-22 ENCOUNTER — Other Ambulatory Visit: Payer: Self-pay

## 2019-12-22 ENCOUNTER — Ambulatory Visit (INDEPENDENT_AMBULATORY_CARE_PROVIDER_SITE_OTHER): Payer: BC Managed Care – PPO

## 2019-12-22 ENCOUNTER — Encounter: Payer: Self-pay | Admitting: Family Medicine

## 2019-12-22 ENCOUNTER — Ambulatory Visit (INDEPENDENT_AMBULATORY_CARE_PROVIDER_SITE_OTHER): Payer: BC Managed Care – PPO | Admitting: Family Medicine

## 2019-12-22 VITALS — BP 124/71 | HR 73 | Ht 70.0 in | Wt 200.0 lb

## 2019-12-22 DIAGNOSIS — Z23 Encounter for immunization: Secondary | ICD-10-CM | POA: Diagnosis not present

## 2019-12-22 DIAGNOSIS — N1831 Chronic kidney disease, stage 3a: Secondary | ICD-10-CM

## 2019-12-22 DIAGNOSIS — I10 Essential (primary) hypertension: Secondary | ICD-10-CM | POA: Diagnosis not present

## 2019-12-22 DIAGNOSIS — M722 Plantar fascial fibromatosis: Secondary | ICD-10-CM | POA: Diagnosis not present

## 2019-12-22 DIAGNOSIS — M79672 Pain in left foot: Secondary | ICD-10-CM

## 2019-12-22 DIAGNOSIS — E785 Hyperlipidemia, unspecified: Secondary | ICD-10-CM | POA: Diagnosis not present

## 2019-12-22 DIAGNOSIS — Z125 Encounter for screening for malignant neoplasm of prostate: Secondary | ICD-10-CM | POA: Diagnosis not present

## 2019-12-22 DIAGNOSIS — R0989 Other specified symptoms and signs involving the circulatory and respiratory systems: Secondary | ICD-10-CM

## 2019-12-22 MED ORDER — ATORVASTATIN CALCIUM 80 MG PO TABS
80.0000 mg | ORAL_TABLET | Freq: Every day | ORAL | 3 refills | Status: DC
Start: 2019-12-22 — End: 2020-12-14

## 2019-12-22 MED ORDER — LISINOPRIL 10 MG PO TABS
10.0000 mg | ORAL_TABLET | Freq: Every day | ORAL | 1 refills | Status: DC
Start: 2019-12-22 — End: 2020-10-02

## 2019-12-22 MED ORDER — NITROGLYCERIN 0.4 MG SL SUBL
SUBLINGUAL_TABLET | SUBLINGUAL | 3 refills | Status: DC
Start: 2019-12-22 — End: 2020-12-14

## 2019-12-22 MED ORDER — METOPROLOL TARTRATE 25 MG PO TABS
12.5000 mg | ORAL_TABLET | Freq: Two times a day (BID) | ORAL | 1 refills | Status: DC
Start: 2019-12-22 — End: 2020-06-19

## 2019-12-22 NOTE — Assessment & Plan Note (Signed)
Due to recheck lipids.  Tolerating statin well. 

## 2019-12-22 NOTE — Assessment & Plan Note (Signed)
Pressure slightly elevated today.  Plan to recheck normally well controlled.  He said he had a very stressful day at work today and they have been working overtime.

## 2019-12-22 NOTE — Progress Notes (Signed)
Established Patient Office Visit  Subjective:  Patient ID: Lee Taylor, male    DOB: 10-09-1955  Age: 64 y.o. MRN: [id number]  CC:  Chief Complaint  Patient presents with  . Hypertension  . Foot Pain    Left heel pain    HPI [name] presents for   Hypertension- Pt denies chest pain, SOB, dizziness, or heart palpitations.  Taking meds as directed w/o problems.  Denies medication side effects.    Left heel pain that is been going on for several weeks at this point.  As a matter fact the last time I saw him about 4 months ago he had right plantar fasciitis.  He did the home exercises and ended up coming to see our sports medicine doc for injection he said it took quite a while for to kick him but then he finally got relief but unfortunately now he is having pain in the left heel.  He says it feels very similar and is in the same location.  He just does a lot of walking and wears boots at work.  He even bought some inserts for both shoes and he says it does help but does not completely resolve his symptoms.  Hyperlipidemia - tolerating stating well with no myalgias or significant side effects.  Lab Results  Component Value Date   CHOL 108 12/31/2018   HDL 54 12/31/2018   LDLCALC 40 12/31/2018   TRIG 60 12/31/2018   CHOLHDL 2.0 12/31/2018    He also reports he gets a lot of phlegm that sort of get stuck in his throat particularly at night it does not really bother him much during the day.  He says is been going on for a long time its not new probably years he does have a history of reflux and takes what sounds like an over-the-counter generic version of Zantac once daily in the morning.  He denies any specific sinus symptoms.   Past Medical History:  Diagnosis Date  . Acute kidney injury (HCC) 10/23/2014   "related to the MI"  . Coronary artery disease 10/2014   a. cath 10/23/2014 2.5 x 20 Synergy DES to the LAD, EF 40-45 percent   b. relook cath 6/29, patent stents, medical  management  . GERD (gastroesophageal reflux disease)   . Hypercholesterolemia   . Hypertension   . Kidney stones    "passed it"  . NSTEMI (non-ST elevated myocardial infarction) (HCC) 10/20/2014   2.5 x 20 Synergy DES to the LAD, EF 40-45 percent    Past Surgical History:  Procedure Laterality Date  . CARDIAC CATHETERIZATION N/A 10/23/2014   Procedure: Left Heart Cath and Coronary Angiography;  Surgeon: Corky Crafts, MD; LAD 95%, thrombotic, small ramus intermedius, CFX and OM branch is patent, RCA 20%     . CARDIAC CATHETERIZATION N/A 10/23/2014   Procedure: Coronary Stent Intervention;  Surgeon: Corky Crafts, MD; 2.5 x 20 Synergy DES to the LAD    . CARDIAC CATHETERIZATION  11/01/2014  . CARDIAC CATHETERIZATION N/A 11/01/2014   Procedure: Left Heart Cath and Coronary Angiography;  Surgeon: Lennette Bihari, MD;  Location: [facility] INVASIVE CV LAB;  Service: Cardiovascular;  Laterality: N/A;  . CORONARY ANGIOPLASTY WITH STENT PLACEMENT  10/23/2014   2.5 x 20 Synergy DES to the LAD  . ESOPHAGOGASTRODUODENOSCOPY N/A 07/07/2015   Procedure: ESOPHAGOGASTRODUODENOSCOPY (EGD);  Surgeon: Charlott Rakes, MD;  Location: [facility] ENDOSCOPY;  Service: Endoscopy;  Laterality: N/A;  . GIVENS CAPSULE STUDY N/A 07/09/2015  Procedure: GIVENS CAPSULE STUDY;  Surgeon: Charlott Rakes, MD;  Location: [facility] ENDOSCOPY;  Service: Endoscopy;  Laterality: N/A;  . TONSILLECTOMY    . TYMPANOPLASTY Left 1960's   "perforated eardrum"    Family History  Problem Relation Age of Onset  . Stroke Father   . Heart failure Mother   . Heart disease Mother        triple bypass   . Heart attack Sister 77       smoker      Social History   Socioeconomic History  . Marital status: Married    Spouse name: [name]   . Number of children: 2  . Years of education: Not on file  . Highest education level: Not on file  Occupational History  . Occupation: Tourist information centre manager: [employer]  Tobacco Use  .  Smoking status: Never Smoker  . Smokeless tobacco: Never Used  Substance and Sexual Activity  . Alcohol use: Yes    Comment: 11/01/2014 "maybe a beer q 2 wks"  . Drug use: No  . Sexual activity: Not Currently    Partners: Female  Other Topics Concern  . Not on file  Social History Narrative  . Not on file   Social Determinants of Health   Financial Resource Strain:   . Difficulty of Paying Living Expenses: Not on file  Food Insecurity:   . Worried About Programme researcher, broadcasting/film/video in the Last Year: Not on file  . Ran Out of Food in the Last Year: Not on file  Transportation Needs:   . Lack of Transportation (Medical): Not on file  . Lack of Transportation (Non-Medical): Not on file  Physical Activity:   . Days of Exercise per Week: Not on file  . Minutes of Exercise per Session: Not on file  Stress:   . Feeling of Stress : Not on file  Social Connections:   . Frequency of Communication with Friends and Family: Not on file  . Frequency of Social Gatherings with Friends and Family: Not on file  . Attends Religious Services: Not on file  . Active Member of Clubs or Organizations: Not on file  . Attends Banker Meetings: Not on file  . Marital Status: Not on file  Intimate Partner Violence:   . Fear of Current or Ex-Partner: Not on file  . Emotionally Abused: Not on file  . Physically Abused: Not on file  . Sexually Abused: Not on file    Outpatient Medications Prior to Visit  Medication Sig Dispense Refill  . aspirin 81 MG tablet Take 81 mg by mouth every other day.    . Calcium Carb-Cholecalciferol 600-800 MG-UNIT TABS Take 1 tablet by mouth daily.     . clotrimazole-betamethasone (LOTRISONE) cream Apply 1 application topically 2 (two) times daily as needed. 45 g 0  . GARLIC PO Take by mouth.    . Ginkgo Biloba 40 MG TABS Take 60 mg by mouth daily.     . raNITIdine HCl (RANITIDINE 75 PO) Take 75 mg by mouth daily.    [name] atorvastatin (LIPITOR) 80 MG tablet TAKE 1  TABLET BY MOUTH ONCE DAILY AT 6 IN THE EVENING/ needs labs 90 tablet 0  . lisinopril (ZESTRIL) 10 MG tablet Take 1 tablet (10 mg total) by mouth daily. (Patient taking differently: Take 10 mg by mouth daily. Patient reports taking 5 mg in the mornings) 90 tablet 0  . metoprolol tartrate (LOPRESSOR) 25 MG tablet Take 0.5 tablets (  12.5 mg total) by mouth 2 (two) times daily. **PATIENT NEEDS OFFICE VISIT FOR ADDITIONAL REFILLS** 30 tablet 0  . nitroGLYCERIN (NITROSTAT) 0.4 MG SL tablet DISSOLVE ONE TABLET UNDER THE TONGUE EVERY 5 MINUTES AS NEEDED FOR CHEST PAIN.  DO NOT EXCEED A TOTAL OF 3 DOSES IN 15 MINUTES 25 tablet 3   No facility-administered medications prior to visit.    Allergies  Allergen Reactions  . Amoxicillin Hives    ROS Review of Systems    Objective:    Physical Exam Constitutional:      Appearance: He is well-developed.  HENT:     Head: Normocephalic and atraumatic.  Cardiovascular:     Rate and Rhythm: Normal rate and regular rhythm.     Heart sounds: Normal heart sounds.  Pulmonary:     Effort: Pulmonary effort is normal.     Breath sounds: Normal breath sounds.  Musculoskeletal:     Comments: Left foot with tenderness at the base of the heel and a little bit more medially.  Also tender at the proximal arch.  Dorsal pedal pulse and posterior tibial pulse 2+.  No rash or erythema or cracking of the skin.  Normal range of motion of the ankle.  Strength is 5 out of 5.  Skin:    General: Skin is warm and dry.  Neurological:     Mental Status: He is alert and oriented to person, place, and time.  Psychiatric:        Behavior: Behavior normal.     BP 124/71   Pulse 73   Ht 5\' 10"  (1.778 m)   Wt 200 lb (90.7 kg)   SpO2 98%   BMI 28.70 kg/m  Wt Readings from Last 3 Encounters:  12/22/19 200 lb (90.7 kg)  08/09/19 198 lb (89.8 kg)  05/23/19 198 lb (89.8 kg)     There are no preventive care reminders to display for this patient.  There are no preventive  care reminders to display for this patient.  Lab Results  Component Value Date   TSH 1.82 05/23/2016   Lab Results  Component Value Date   WBC 9.2 12/31/2018   HGB 14.2 12/31/2018   HCT 42.8 12/31/2018   MCV 93.9 12/31/2018   PLT 215 12/31/2018   Lab Results  Component Value Date   NA 141 12/31/2018   K 4.4 12/31/2018   CO2 26 12/31/2018   GLUCOSE 84 12/31/2018   BUN 30 (H) 12/31/2018   CREATININE 1.38 (H) 12/31/2018   BILITOT 1.1 12/31/2018   ALKPHOS 71 05/23/2016   AST 30 12/31/2018   ALT 30 12/31/2018   PROT 6.8 12/31/2018   ALBUMIN 4.2 05/23/2016   CALCIUM 9.8 12/31/2018   ANIONGAP 6 07/10/2015   Lab Results  Component Value Date   CHOL 108 12/31/2018   Lab Results  Component Value Date   HDL 54 12/31/2018   Lab Results  Component Value Date   LDLCALC 40 12/31/2018   Lab Results  Component Value Date   TRIG 60 12/31/2018   Lab Results  Component Value Date   CHOLHDL 2.0 12/31/2018   Lab Results  Component Value Date   HGBA1C 5.6 10/24/2014      Assessment & Plan:   Problem List Items Addressed This Visit      Cardiovascular and Mediastinum   Essential hypertension, benign - Primary    Pressure slightly elevated today.  Plan to recheck normally well controlled.  He said he had a very  stressful day at work today and they have been working overtime.      Relevant Medications   atorvastatin (LIPITOR) 80 MG tablet   nitroGLYCERIN (NITROSTAT) 0.4 MG SL tablet   lisinopril (ZESTRIL) 10 MG tablet   metoprolol tartrate (LOPRESSOR) 25 MG tablet   Other Relevant Orders   COMPLETE METABOLIC PANEL WITH GFR   Lipid panel   PSA     Genitourinary   CKD (chronic kidney disease) stage 3, GFR 30-59 ml/min    Due to recheck renal function.  Following every 6 months.  Warned about taking NSAIDs and potential further damage to the kidneys with these types of medications.        Other   Left foot pain   Hyperlipidemia    Due to recheck lipids.   Tolerating statin well.      Relevant Medications   atorvastatin (LIPITOR) 80 MG tablet   nitroGLYCERIN (NITROSTAT) 0.4 MG SL tablet   lisinopril (ZESTRIL) 10 MG tablet   metoprolol tartrate (LOPRESSOR) 25 MG tablet   Other Relevant Orders   COMPLETE METABOLIC PANEL WITH GFR   Lipid panel   PSA    Other Visit Diagnoses    Need for immunization against influenza       Screening PSA (prostate specific antigen)       Relevant Orders   COMPLETE METABOLIC PANEL WITH GFR   Lipid panel   PSA   Plantar fasciitis, left       Relevant Orders   DG Foot Complete Left   Phlegm in throat          Left plantar fasciitis-exam most consistent with plantar fasciitis recommend continue with stretches and the exercises discussed that it is helpful for the tissue recommend icing for comfort.  Okay to get x-ray today.  He can always follow-up with Dr. Benjamin Stainhekkekandam for injection if not improving.  Phlegm in throat-recommend he try increasing his reflux medicine to twice a day and taking that second tab about an hour before bedtime for at least 3 weeks to see if he notices any improvement.  If not then it could also be postnasal drip and we discussed possibly trial nasal saline irrigation in addition to fluticasone nasal spray to see if that helps.  Meds ordered this encounter  Medications  . atorvastatin (LIPITOR) 80 MG tablet    Sig: Take 1 tablet (80 mg total) by mouth at bedtime.    Dispense:  90 tablet    Refill:  3  . nitroGLYCERIN (NITROSTAT) 0.4 MG SL tablet    Sig: DISSOLVE ONE TABLET UNDER THE TONGUE EVERY 5 MINUTES AS NEEDED FOR CHEST PAIN.  DO NOT EXCEED A TOTAL OF 3 DOSES IN 15 MINUTES    Dispense:  25 tablet    Refill:  3    Please consider 90 day supplies to promote better adherence  . lisinopril (ZESTRIL) 10 MG tablet    Sig: Take 1 tablet (10 mg total) by mouth daily.    Dispense:  90 tablet    Refill:  1  . metoprolol tartrate (LOPRESSOR) 25 MG tablet    Sig: Take 0.5 tablets  (12.5 mg total) by mouth 2 (two) times daily.    Dispense:  90 tablet    Refill:  1    Follow-up: Return in about 6 months (around 06/23/2020) for Hypertension, recheck kidneys.   Time spent in encounter 30 minutes including reviewing the chart notes etc.  Nani Gasseratherine Shayne Diguglielmo, MD

## 2019-12-22 NOTE — Assessment & Plan Note (Signed)
Due to recheck renal function.  Following every 6 months.  Warned about taking NSAIDs and potential further damage to the kidneys with these types of medications.

## 2019-12-23 LAB — COMPLETE METABOLIC PANEL WITH GFR
AG Ratio: 2 (calc) (ref 1.0–2.5)
ALT: 32 U/L (ref 9–46)
AST: 26 U/L (ref 10–35)
Albumin: 4.3 g/dL (ref 3.6–5.1)
Alkaline phosphatase (APISO): 85 U/L (ref 35–144)
BUN/Creatinine Ratio: 16 (calc) (ref 6–22)
BUN: 20 mg/dL (ref 7–25)
CO2: 28 mmol/L (ref 20–32)
Calcium: 9.1 mg/dL (ref 8.6–10.3)
Chloride: 103 mmol/L (ref 98–110)
Creat: 1.27 mg/dL — ABNORMAL HIGH (ref 0.70–1.25)
GFR, Est African American: 69 mL/min/{1.73_m2} (ref >=60)
GFR, Est Non African American: 59 mL/min/{1.73_m2} — ABNORMAL LOW (ref >=60)
Globulin: 2.2 g/dL (calc) (ref 1.9–3.7)
Glucose, Bld: 99 mg/dL (ref 65–139)
Potassium: 4.5 mmol/L (ref 3.5–5.3)
Sodium: 138 mmol/L (ref 135–146)
Total Bilirubin: 0.5 mg/dL (ref 0.2–1.2)
Total Protein: 6.5 g/dL (ref 6.1–8.1)

## 2019-12-23 LAB — LIPID PANEL
Cholesterol: 121 mg/dL (ref <200)
HDL: 48 mg/dL (ref >=40)
LDL Cholesterol (Calc): 49 mg/dL (calc)
Non-HDL Cholesterol (Calc): 73 mg/dL (calc) (ref <130)
Total CHOL/HDL Ratio: 2.5 (calc) (ref <5.0)
Triglycerides: 164 mg/dL — ABNORMAL HIGH (ref <150)

## 2019-12-23 LAB — PSA: PSA: 2.2 ng/mL (ref <=4.0)

## 2020-01-03 ENCOUNTER — Other Ambulatory Visit: Payer: Self-pay

## 2020-01-03 ENCOUNTER — Ambulatory Visit (INDEPENDENT_AMBULATORY_CARE_PROVIDER_SITE_OTHER): Payer: BC Managed Care – PPO | Admitting: Sports Medicine

## 2020-01-03 ENCOUNTER — Ambulatory Visit (INDEPENDENT_AMBULATORY_CARE_PROVIDER_SITE_OTHER): Payer: BC Managed Care – PPO

## 2020-01-03 DIAGNOSIS — M722 Plantar fascial fibromatosis: Secondary | ICD-10-CM

## 2020-01-03 NOTE — Progress Notes (Signed)
    Procedures performed today:    Procedure: Real-time Ultrasound Guided injection of the left plantar fascial origin Device: Samsung HS60  Verbal informed consent obtained.  Time-out conducted.  Noted no overlying erythema, induration, or other signs of local infection.  Skin prepped in a sterile fashion.  Local anesthesia: Topical Ethyl chloride.  With sterile technique and under real time ultrasound guidance: 1 cc Kenalog 40, 1 cc lidocaine, 1 cc bupivacaine injected easily Completed without difficulty  Pain immediately resolved suggesting accurate placement of the medication.  Advised to call if fevers/chills, erythema, induration, drainage, or persistent bleeding.  Images permanently stored and available for review in the ultrasound unit.  Impression: Technically successful ultrasound guided injection.  ___________________________________________ Lee Taylor. Lee Taylor, M.D., ABFM., CAQSM. Primary Care and Sports Medicine [facility]   Adjunct Instructor of Family Medicine  [employer]  Independent interpretation of notes and tests performed by another provider:   None.  Brief History, Exam, Impression, and Recommendations:    Plantar fasciitis, bilateral [name] returns, he is a very pleasant 64 year old male, back in April we injected his right plantar fascia, he did well and never had a recurrence of pain, today he has pain on the left side, injected per his request, return to see me as needed, the neck step would be MRI +/- referral to podiatry, PRP is another option.    ___________________________________________ Lee Taylor. Lee Taylor, M.D., ABFM., CAQSM. Primary Care and Sports Medicine  [facility]  Adjunct Instructor of Family Medicine  [employer]

## 2020-01-03 NOTE — Assessment & Plan Note (Signed)
Lee Taylor returns, he is a very pleasant 64 year old male, back in April we injected his right plantar fascia, he did well and never had a recurrence of pain, today he has pain on the left side, injected per his request, return to see me as needed, the neck step would be MRI +/- referral to podiatry, PRP is another option.

## 2020-06-19 ENCOUNTER — Other Ambulatory Visit: Payer: Self-pay | Admitting: Family Medicine

## 2020-06-22 ENCOUNTER — Other Ambulatory Visit: Payer: Self-pay

## 2020-06-22 ENCOUNTER — Ambulatory Visit (INDEPENDENT_AMBULATORY_CARE_PROVIDER_SITE_OTHER): Payer: BC Managed Care – PPO | Admitting: Family Medicine

## 2020-06-22 ENCOUNTER — Encounter: Payer: Self-pay | Admitting: Family Medicine

## 2020-06-22 VITALS — BP 115/73 | HR 70 | Ht 70.0 in | Wt 195.0 lb

## 2020-06-22 DIAGNOSIS — R200 Anesthesia of skin: Secondary | ICD-10-CM

## 2020-06-22 DIAGNOSIS — I10 Essential (primary) hypertension: Secondary | ICD-10-CM | POA: Diagnosis not present

## 2020-06-22 DIAGNOSIS — R202 Paresthesia of skin: Secondary | ICD-10-CM

## 2020-06-22 DIAGNOSIS — N1831 Chronic kidney disease, stage 3a: Secondary | ICD-10-CM | POA: Diagnosis not present

## 2020-06-22 NOTE — Progress Notes (Signed)
Established Patient Office Visit  Subjective:  Patient ID: Lee Taylor, male    DOB: 26-Nov-1955  Age: 65 y.o. MRN: [id number]  CC:  Chief Complaint  Patient presents with  . Hypertension    HPI Lee Taylor presents for   Hypertension- Pt denies chest pain, SOB, dizziness, or heart palpitations.  Taking meds as directed w/o problems.  Denies medication side effects.    He complains that he get some numbness and tingling in his feet particularly while he is at work.  He wears work boots and works a 10-hour day and is mostly on his feet.  He says when he is able to go about 3 days without working he says it actually gets better.  Past Medical History:  Diagnosis Date  . Acute kidney injury (HCC) 10/23/2014   "related to the MI"  . Coronary artery disease 10/2014   a. cath 10/23/2014 2.5 x 20 Synergy DES to the LAD, EF 40-45 percent   b. relook cath 6/29, patent stents, medical management  . GERD (gastroesophageal reflux disease)   . Hypercholesterolemia   . Hypertension   . Kidney stones    "passed it"  . NSTEMI (non-ST elevated myocardial infarction) (HCC) 10/20/2014   2.5 x 20 Synergy DES to the LAD, EF 40-45 percent    Past Surgical History:  Procedure Laterality Date  . CARDIAC CATHETERIZATION N/A 10/23/2014   Procedure: Left Heart Cath and Coronary Angiography;  Surgeon: Corky Crafts, MD; LAD 95%, thrombotic, small ramus intermedius, CFX and OM branch is patent, RCA 20%     . CARDIAC CATHETERIZATION N/A 10/23/2014   Procedure: Coronary Stent Intervention;  Surgeon: Corky Crafts, MD; 2.5 x 20 Synergy DES to the LAD    . CARDIAC CATHETERIZATION  11/01/2014  . CARDIAC CATHETERIZATION N/A 11/01/2014   Procedure: Left Heart Cath and Coronary Angiography;  Surgeon: Lennette Bihari, MD;  Location: [facility] INVASIVE CV LAB;  Service: Cardiovascular;  Laterality: N/A;  . CORONARY ANGIOPLASTY WITH STENT PLACEMENT  10/23/2014   2.5 x 20 Synergy DES to the LAD  .  ESOPHAGOGASTRODUODENOSCOPY N/A 07/07/2015   Procedure: ESOPHAGOGASTRODUODENOSCOPY (EGD);  Surgeon: Charlott Rakes, MD;  Location: [facility] ENDOSCOPY;  Service: Endoscopy;  Laterality: N/A;  . GIVENS CAPSULE STUDY N/A 07/09/2015   Procedure: GIVENS CAPSULE STUDY;  Surgeon: Charlott Rakes, MD;  Location: [facility] ENDOSCOPY;  Service: Endoscopy;  Laterality: N/A;  . TONSILLECTOMY    . TYMPANOPLASTY Left 1960's   "perforated eardrum"    Family History  Problem Relation Age of Onset  . Stroke Father   . Heart failure Mother   . Heart disease Mother        triple bypass   . Heart attack Sister 10       smoker      Social History   Socioeconomic History  . Marital status: Married    Spouse name: [name]   . Number of children: 2  . Years of education: Not on file  . Highest education level: Not on file  Occupational History  . Occupation: Tourist information centre manager: [employer]  Tobacco Use  . Smoking status: Never Smoker  . Smokeless tobacco: Never Used  Substance and Sexual Activity  . Alcohol use: Yes    Comment: 11/01/2014 "maybe a beer q 2 wks"  . Drug use: No  . Sexual activity: Not Currently    Partners: Female  Other Topics Concern  . Not on file  Social History Narrative  .  Not on file   Social Determinants of Health   Financial Resource Strain: Not on file  Food Insecurity: Not on file  Transportation Needs: Not on file  Physical Activity: Not on file  Stress: Not on file  Social Connections: Not on file  Intimate Partner Violence: Not on file    Outpatient Medications Prior to Visit  Medication Sig Dispense Refill  . aspirin 81 MG tablet Take 81 mg by mouth every other day.    [name] atorvastatin (LIPITOR) 80 MG tablet Take 1 tablet (80 mg total) by mouth at bedtime. 90 tablet 3  . Calcium Carb-Cholecalciferol 600-800 MG-UNIT TABS Take 1 tablet by mouth daily.     [name] GARLIC PO Take by mouth.    . Ginkgo Biloba 40 MG TABS Take 60 mg by mouth daily.     [name] lisinopril  (ZESTRIL) 10 MG tablet Take 1 tablet (10 mg total) by mouth daily. 90 tablet 1  . metoprolol tartrate (LOPRESSOR) 25 MG tablet Take 1/2 (one-half) tablet by mouth twice daily 90 tablet 0  . nitroGLYCERIN (NITROSTAT) 0.4 MG SL tablet DISSOLVE ONE TABLET UNDER THE TONGUE EVERY 5 MINUTES AS NEEDED FOR CHEST PAIN.  DO NOT EXCEED A TOTAL OF 3 DOSES IN 15 MINUTES 25 tablet 3  . raNITIdine HCl (RANITIDINE 75 PO) Take 75 mg by mouth daily.    . clotrimazole-betamethasone (LOTRISONE) cream Apply 1 application topically 2 (two) times daily as needed. 45 g 0   No facility-administered medications prior to visit.    Allergies  Allergen Reactions  . Amoxicillin Hives    ROS Review of Systems    Objective:    Physical Exam Constitutional:      Appearance: He is well-developed and well-nourished.  HENT:     Head: Normocephalic and atraumatic.  Cardiovascular:     Rate and Rhythm: Normal rate and regular rhythm.     Heart sounds: Normal heart sounds.  Pulmonary:     Effort: Pulmonary effort is normal.     Breath sounds: Normal breath sounds.  Skin:    General: Skin is warm and dry.  Neurological:     Mental Status: He is alert and oriented to person, place, and time.  Psychiatric:        Mood and Affect: Mood and affect normal.        Behavior: Behavior normal.     BP 115/73   Pulse 70   Ht 5\' 10"  (1.778 m)   Wt 195 lb (88.5 kg)   SpO2 97%   BMI 27.98 kg/m  Wt Readings from Last 3 Encounters:  06/22/20 195 lb (88.5 kg)  12/22/19 200 lb (90.7 kg)  08/09/19 198 lb (89.8 kg)     There are no preventive care reminders to display for this patient.  There are no preventive care reminders to display for this patient.  Lab Results  Component Value Date   TSH 1.82 05/23/2016   Lab Results  Component Value Date   WBC 9.2 12/31/2018   HGB 14.2 12/31/2018   HCT 42.8 12/31/2018   MCV 93.9 12/31/2018   PLT 215 12/31/2018   Lab Results  Component Value Date   NA 138  12/22/2019   K 4.5 12/22/2019   CO2 28 12/22/2019   GLUCOSE 99 12/22/2019   BUN 20 12/22/2019   CREATININE 1.27 (H) 12/22/2019   BILITOT 0.5 12/22/2019   ALKPHOS 71 05/23/2016   AST 26 12/22/2019   ALT 32 12/22/2019   PROT  6.5 12/22/2019   ALBUMIN 4.2 05/23/2016   CALCIUM 9.1 12/22/2019   ANIONGAP 6 07/10/2015   Lab Results  Component Value Date   CHOL 121 12/22/2019   Lab Results  Component Value Date   HDL 48 12/22/2019   Lab Results  Component Value Date   LDLCALC 49 12/22/2019   Lab Results  Component Value Date   TRIG 164 (H) 12/22/2019   Lab Results  Component Value Date   CHOLHDL 2.5 12/22/2019   Lab Results  Component Value Date   HGBA1C 5.6 10/24/2014      Assessment & Plan:   Problem List Items Addressed This Visit      Cardiovascular and Mediastinum   Essential hypertension, benign - Primary   Relevant Orders   BASIC METABOLIC PANEL WITH GFR   B12   CBC   TSH   Urine Microalbumin w/creat. ratio     Genitourinary   CKD (chronic kidney disease) stage 3, GFR 30-59 ml/min (HCC)   Relevant Orders   BASIC METABOLIC PANEL WITH GFR   B12   CBC   TSH   Urine Microalbumin w/creat. ratio    Other Visit Diagnoses    Numbness and tingling of both feet       Relevant Orders   BASIC METABOLIC PANEL WITH GFR   B12   CBC   TSH      Numbness and tingling in feet - we discussed that it could be somewhat mechanical since he notices it more on days where he is working and wearing his work boots that seems to be better after several days of rest.  Though also consider peripheral neuropathy.  So we will do some additional labs including checking for B12, and anemia as well as thyroid disorder.  No orders of the defined types were placed in this encounter.   Follow-up: Return in about 6 months (around 12/20/2020) for Wellness Exam.    Nani Gasser, MD

## 2020-06-25 ENCOUNTER — Other Ambulatory Visit: Payer: Self-pay | Admitting: Family Medicine

## 2020-06-26 LAB — MICROALBUMIN / CREATININE URINE RATIO
Creatinine, Urine: 211 mg/dL (ref 20–320)
Microalb Creat Ratio: 3 mcg/mg creat (ref <30)
Microalb, Ur: 0.7 mg/dL

## 2020-06-26 LAB — BASIC METABOLIC PANEL WITH GFR
BUN/Creatinine Ratio: 13 (calc) (ref 6–22)
BUN: 17 mg/dL (ref 7–25)
CO2: 30 mmol/L (ref 20–32)
Calcium: 9.5 mg/dL (ref 8.6–10.3)
Chloride: 104 mmol/L (ref 98–110)
Creat: 1.28 mg/dL — ABNORMAL HIGH (ref 0.70–1.25)
GFR, Est African American: 68 mL/min/{1.73_m2} (ref >=60)
GFR, Est Non African American: 59 mL/min/{1.73_m2} — ABNORMAL LOW (ref >=60)
Glucose, Bld: 95 mg/dL (ref 65–99)
Potassium: 4.5 mmol/L (ref 3.5–5.3)
Sodium: 140 mmol/L (ref 135–146)

## 2020-06-26 LAB — CBC
HCT: 44 % (ref 38.5–50.0)
Hemoglobin: 14.8 g/dL (ref 13.2–17.1)
MCH: 31.2 pg (ref 27.0–33.0)
MCHC: 33.6 g/dL (ref 32.0–36.0)
MCV: 92.8 fL (ref 80.0–100.0)
MPV: 10.9 fL (ref 7.5–12.5)
Platelets: 240 10*3/uL (ref 140–400)
RBC: 4.74 10*6/uL (ref 4.20–5.80)
RDW: 11.7 % (ref 11.0–15.0)
WBC: 8.1 10*3/uL (ref 3.8–10.8)

## 2020-06-26 LAB — VITAMIN B12: Vitamin B-12: 554 pg/mL (ref 200–1100)

## 2020-06-26 LAB — TSH: TSH: 2.68 mIU/L (ref 0.40–4.50)

## 2020-09-16 ENCOUNTER — Other Ambulatory Visit: Payer: Self-pay | Admitting: Family Medicine

## 2020-09-20 ENCOUNTER — Other Ambulatory Visit: Payer: Self-pay

## 2020-09-20 ENCOUNTER — Encounter: Payer: Self-pay | Admitting: Family Medicine

## 2020-09-20 ENCOUNTER — Ambulatory Visit (INDEPENDENT_AMBULATORY_CARE_PROVIDER_SITE_OTHER): Payer: BC Managed Care – PPO | Admitting: Family Medicine

## 2020-09-20 VITALS — BP 123/72 | HR 61 | Ht 70.0 in | Wt 191.0 lb

## 2020-09-20 DIAGNOSIS — Z0000 Encounter for general adult medical examination without abnormal findings: Secondary | ICD-10-CM

## 2020-09-20 DIAGNOSIS — Z23 Encounter for immunization: Secondary | ICD-10-CM

## 2020-09-20 NOTE — Patient Instructions (Signed)
Preventive Care 65 Years and Older, Male Preventive care refers to lifestyle choices and visits with your health care provider that can promote health and wellness. This includes:  A yearly physical exam. This is also called an annual wellness visit.  Regular dental and eye exams.  Immunizations.  Screening for certain conditions.  Healthy lifestyle choices, such as: ? Eating a healthy diet. ? Getting regular exercise. ? Not using drugs or products that contain nicotine and tobacco. ? Limiting alcohol use. What can I expect for my preventive care visit? Physical exam Your health care provider will check your:  Height and weight. These may be used to calculate your BMI (body mass index). BMI is a measurement that tells if you are at a healthy weight.  Heart rate and blood pressure.  Body temperature.  Skin for abnormal spots. Counseling Your health care provider may ask you questions about your:  Past medical problems.  Family's medical history.  Alcohol, tobacco, and drug use.  Emotional well-being.  Home life and relationship well-being.  Sexual activity.  Diet, exercise, and sleep habits.  History of falls.  Memory and ability to understand (cognition).  Work and work environment.  Access to firearms. What immunizations do I need? Vaccines are usually given at various ages, according to a schedule. Your health care provider will recommend vaccines for you based on your age, medical history, and lifestyle or other factors, such as travel or where you work.   What tests do I need? Blood tests  Lipid and cholesterol levels. These may be checked every 5 years, or more often depending on your overall health.  Hepatitis C test.  Hepatitis B test. Screening  Lung cancer screening. You may have this screening every year starting at age 55 if you have a 30-pack-year history of smoking and currently smoke or have quit within the past 15 years.  Colorectal  cancer screening. ? All adults should have this screening starting at age 50 and continuing until age 75. ? Your health care provider may recommend screening at age 45 if you are at increased risk. ? You will have tests every 1-10 years, depending on your results and the type of screening test.  Prostate cancer screening. Recommendations will vary depending on your family history and other risks.  Genital exam to check for testicular cancer or hernias.  Diabetes screening. ? This is done by checking your blood sugar (glucose) after you have not eaten for a while (fasting). ? You may have this done every 1-3 years.  Abdominal aortic aneurysm (AAA) screening. You may need this if you are a current or former smoker.  STD (sexually transmitted disease) testing, if you are at risk. Follow these instructions at home: Eating and drinking  Eat a diet that includes fresh fruits and vegetables, whole grains, lean protein, and low-fat dairy products. Limit your intake of foods with high amounts of sugar, saturated fats, and salt.  Take vitamin and mineral supplements as recommended by your health care provider.  Do not drink alcohol if your health care provider tells you not to drink.  If you drink alcohol: ? Limit how much you have to 0-2 drinks a day. ? Be aware of how much alcohol is in your drink. In the [place] one drink equals one 12 oz bottle of beer (355 mL), one 5 oz glass of wine (148 mL), or one 1 oz glass of hard liquor (44 mL).   Lifestyle  Take daily care of your teeth   and gums. Brush your teeth every morning and night with fluoride toothpaste. Floss one time each day.  Stay active. Exercise for at least 30 minutes 5 or more days each week.  Do not use any products that contain nicotine or tobacco, such as cigarettes, e-cigarettes, and chewing tobacco. If you need help quitting, ask your health care provider.  Do not use drugs.  If you are sexually active, practice safe sex.  Use a condom or other form of protection to prevent STIs (sexually transmitted infections).  Talk with your health care provider about taking a low-dose aspirin or statin.  Find healthy ways to cope with stress, such as: ? Meditation, yoga, or listening to music. ? Journaling. ? Talking to a trusted person. ? Spending time with friends and family. Safety  Always wear your seat belt while driving or riding in a vehicle.  Do not drive: ? If you have been drinking alcohol. Do not ride with someone who has been drinking. ? When you are tired or distracted. ? While texting.  Wear a helmet and other protective equipment during sports activities.  If you have firearms in your house, make sure you follow all gun safety procedures. What's next?  Visit your health care provider once a year for an annual wellness visit.  Ask your health care provider how often you should have your eyes and teeth checked.  Stay up to date on all vaccines. This information is not intended to replace advice given to you by your health care provider. Make sure you discuss any questions you have with your health care provider. Document Revised: 01/18/2019 Document Reviewed: 04/15/2018 Elsevier Patient Education  2021 Elsevier Inc.  

## 2020-09-20 NOTE — Progress Notes (Signed)
Established Patient Office Visit  Subjective:  Patient ID: Lee Taylor, male    DOB: 1955/07/12  Age: 65 y.o. MRN: [id number]  CC:  Chief Complaint  Patient presents with  . Annual Exam    This will be his last OV before he retires and moves to [facility].     HPI Lee Taylor presents for for CPE.    Past Medical History:  Diagnosis Date  . Acute kidney injury (HCC) 10/23/2014   "related to the MI"  . Coronary artery disease 10/2014   a. cath 10/23/2014 2.5 x 20 Synergy DES to the LAD, EF 40-45 percent   b. relook cath 6/29, patent stents, medical management  . GERD (gastroesophageal reflux disease)   . Hypercholesterolemia   . Hypertension   . Kidney stones    "passed it"  . NSTEMI (non-ST elevated myocardial infarction) (HCC) 10/20/2014   2.5 x 20 Synergy DES to the LAD, EF 40-45 percent    Past Surgical History:  Procedure Laterality Date  . CARDIAC CATHETERIZATION N/A 10/23/2014   Procedure: Left Heart Cath and Coronary Angiography;  Surgeon: Corky Crafts, MD; LAD 95%, thrombotic, small ramus intermedius, CFX and OM branch is patent, RCA 20%     . CARDIAC CATHETERIZATION N/A 10/23/2014   Procedure: Coronary Stent Intervention;  Surgeon: Corky Crafts, MD; 2.5 x 20 Synergy DES to the LAD    . CARDIAC CATHETERIZATION  11/01/2014  . CARDIAC CATHETERIZATION N/A 11/01/2014   Procedure: Left Heart Cath and Coronary Angiography;  Surgeon: Lennette Bihari, MD;  Location: [facility] INVASIVE CV LAB;  Service: Cardiovascular;  Laterality: N/A;  . CORONARY ANGIOPLASTY WITH STENT PLACEMENT  10/23/2014   2.5 x 20 Synergy DES to the LAD  . ESOPHAGOGASTRODUODENOSCOPY N/A 07/07/2015   Procedure: ESOPHAGOGASTRODUODENOSCOPY (EGD);  Surgeon: Charlott Rakes, MD;  Location: [facility] ENDOSCOPY;  Service: Endoscopy;  Laterality: N/A;  . GIVENS CAPSULE STUDY N/A 07/09/2015   Procedure: GIVENS CAPSULE STUDY;  Surgeon: Charlott Rakes, MD;  Location: [facility] ENDOSCOPY;  Service: Endoscopy;  Laterality: N/A;   . TONSILLECTOMY    . TYMPANOPLASTY Left 1960's   "perforated eardrum"    Family History  Problem Relation Age of Onset  . Stroke Father   . Heart failure Mother   . Heart disease Mother        triple bypass   . Heart attack Sister 8       smoker      Social History   Socioeconomic History  . Marital status: Married    Spouse name: [name]   . Number of children: 2  . Years of education: Not on file  . Highest education level: Not on file  Occupational History  . Occupation: Tourist information centre manager: [employer]  Tobacco Use  . Smoking status: Never Smoker  . Smokeless tobacco: Never Used  Substance and Sexual Activity  . Alcohol use: Yes    Comment: 11/01/2014 "maybe a beer q 2 wks"  . Drug use: No  . Sexual activity: Not Currently    Partners: Female  Other Topics Concern  . Not on file  Social History Narrative  . Not on file   Social Determinants of Health   Financial Resource Strain: Not on file  Food Insecurity: Not on file  Transportation Needs: Not on file  Physical Activity: Not on file  Stress: Not on file  Social Connections: Not on file  Intimate Partner Violence: Not on file    Outpatient Medications Prior  to Visit  Medication Sig Dispense Refill  . aspirin 81 MG tablet Take 81 mg by mouth every other day.    [name] atorvastatin (LIPITOR) 80 MG tablet Take 1 tablet (80 mg total) by mouth at bedtime. 90 tablet 3  . Calcium Carb-Cholecalciferol 600-800 MG-UNIT TABS Take 1 tablet by mouth daily.     [name] GARLIC PO Take by mouth.    . Ginkgo Biloba 40 MG TABS Take 60 mg by mouth daily.     [name] lisinopril (ZESTRIL) 10 MG tablet Take 1 tablet (10 mg total) by mouth daily. 90 tablet 1  . metoprolol tartrate (LOPRESSOR) 25 MG tablet Take 1/2 (one-half) tablet by mouth twice daily 90 tablet 0  . nitroGLYCERIN (NITROSTAT) 0.4 MG SL tablet DISSOLVE ONE TABLET UNDER THE TONGUE EVERY 5 MINUTES AS NEEDED FOR CHEST PAIN.  DO NOT EXCEED A TOTAL OF 3 DOSES IN 15  MINUTES 25 tablet 3  . raNITIdine HCl (RANITIDINE 75 PO) Take 75 mg by mouth daily.     No facility-administered medications prior to visit.    Allergies  Allergen Reactions  . Amoxicillin Hives    ROS Review of Systems    Objective:    Physical Exam Constitutional:      Appearance: He is well-developed.  HENT:     Head: Normocephalic and atraumatic.     Right Ear: External ear normal.     Left Ear: External ear normal.     Nose: Nose normal.  Eyes:     Conjunctiva/sclera: Conjunctivae normal.     Pupils: Pupils are equal, round, and reactive to light.  Neck:     Thyroid: No thyromegaly.  Cardiovascular:     Rate and Rhythm: Normal rate and regular rhythm.     Heart sounds: Normal heart sounds.  Pulmonary:     Effort: Pulmonary effort is normal.     Breath sounds: Normal breath sounds.  Abdominal:     General: Bowel sounds are normal. There is no distension.     Palpations: Abdomen is soft. There is no mass.     Tenderness: There is no abdominal tenderness. There is no guarding or rebound.  Musculoskeletal:        General: Normal range of motion.     Cervical back: Normal range of motion and neck supple.  Lymphadenopathy:     Cervical: No cervical adenopathy.  Skin:    General: Skin is warm and dry.  Neurological:     Mental Status: He is alert and oriented to person, place, and time.     Deep Tendon Reflexes: Reflexes are normal and symmetric.  Psychiatric:        Behavior: Behavior normal.        Thought Content: Thought content normal.        Judgment: Judgment normal.     BP 123/72   Pulse 61   Ht 5\' 10"  (1.778 m)   Wt 191 lb (86.6 kg)   SpO2 99%   BMI 27.41 kg/m  Wt Readings from Last 3 Encounters:  09/20/20 191 lb (86.6 kg)  06/22/20 195 lb (88.5 kg)  12/22/19 200 lb (90.7 kg)     Health Maintenance Due  Topic Date Due  . PNA vac Low Risk Adult (1 of 2 - PCV13) 09/05/2020    There are no preventive care reminders to display for this  patient.  Lab Results  Component Value Date   TSH 2.68 06/25/2020   Lab Results  Component Value  Date   WBC 8.1 06/25/2020   HGB 14.8 06/25/2020   HCT 44.0 06/25/2020   MCV 92.8 06/25/2020   PLT 240 06/25/2020   Lab Results  Component Value Date   NA 140 06/25/2020   K 4.5 06/25/2020   CO2 30 06/25/2020   GLUCOSE 95 06/25/2020   BUN 17 06/25/2020   CREATININE 1.28 (H) 06/25/2020   BILITOT 0.5 12/22/2019   ALKPHOS 71 05/23/2016   AST 26 12/22/2019   ALT 32 12/22/2019   PROT 6.5 12/22/2019   ALBUMIN 4.2 05/23/2016   CALCIUM 9.5 06/25/2020   ANIONGAP 6 07/10/2015   Lab Results  Component Value Date   CHOL 121 12/22/2019   Lab Results  Component Value Date   HDL 48 12/22/2019   Lab Results  Component Value Date   LDLCALC 49 12/22/2019   Lab Results  Component Value Date   TRIG 164 (H) 12/22/2019   Lab Results  Component Value Date   CHOLHDL 2.5 12/22/2019   Lab Results  Component Value Date   HGBA1C 5.6 10/24/2014      Assessment & Plan:   Problem List Items Addressed This Visit   None   Visit Diagnoses    Wellness examination    -  Primary   Relevant Orders   Lipid Panel w/reflex Direct LDL   PSA   COMPLETE METABOLIC PANEL WITH GFR   Need for tetanus, diphtheria, and acellular pertussis (Tdap) vaccine in patient of adolescent age or older       Relevant Orders   Tdap vaccine greater than or equal to 7yo IM (Completed)   Need for vaccination with PVAX (pneumococcal vaccination)       Relevant Orders   Pneumococcal conjugate vaccine 20-valent (Prevnar 20) (Completed)      No orders of the defined types were placed in this encounter.   Follow-up: Return in about 6 months (around 03/23/2021).    Nani Gasser, MD

## 2020-09-21 LAB — COMPLETE METABOLIC PANEL WITH GFR
AG Ratio: 1.9 (calc) (ref 1.0–2.5)
ALT: 26 U/L (ref 9–46)
AST: 22 U/L (ref 10–35)
Albumin: 4.3 g/dL (ref 3.6–5.1)
Alkaline phosphatase (APISO): 100 U/L (ref 35–144)
BUN/Creatinine Ratio: 11 (calc) (ref 6–22)
BUN: 14 mg/dL (ref 7–25)
CO2: 24 mmol/L (ref 20–32)
Calcium: 9.5 mg/dL (ref 8.6–10.3)
Chloride: 102 mmol/L (ref 98–110)
Creat: 1.27 mg/dL — ABNORMAL HIGH (ref 0.70–1.25)
GFR, Est African American: 68 mL/min/{1.73_m2} (ref >=60)
GFR, Est Non African American: 59 mL/min/{1.73_m2} — ABNORMAL LOW (ref >=60)
Globulin: 2.3 g/dL (calc) (ref 1.9–3.7)
Glucose, Bld: 85 mg/dL (ref 65–99)
Potassium: 5.1 mmol/L (ref 3.5–5.3)
Sodium: 138 mmol/L (ref 135–146)
Total Bilirubin: 0.7 mg/dL (ref 0.2–1.2)
Total Protein: 6.6 g/dL (ref 6.1–8.1)

## 2020-09-21 LAB — LIPID PANEL W/REFLEX DIRECT LDL
Cholesterol: 97 mg/dL (ref <200)
HDL: 49 mg/dL (ref >=40)
LDL Cholesterol (Calc): 32 mg/dL (calc)
Non-HDL Cholesterol (Calc): 48 mg/dL (calc) (ref <130)
Total CHOL/HDL Ratio: 2 (calc) (ref <5.0)
Triglycerides: 79 mg/dL (ref <150)

## 2020-09-21 LAB — SPECIMEN COMPROMISED

## 2020-09-21 LAB — PSA: PSA: 3.34 ng/mL (ref <=4.00)

## 2020-09-30 ENCOUNTER — Other Ambulatory Visit: Payer: Self-pay | Admitting: Family Medicine

## 2020-10-08 ENCOUNTER — Ambulatory Visit: Payer: BC Managed Care – PPO | Admitting: Family Medicine

## 2020-12-13 ENCOUNTER — Other Ambulatory Visit: Payer: Self-pay | Admitting: Family Medicine

## 2020-12-14 ENCOUNTER — Other Ambulatory Visit: Payer: Self-pay | Admitting: *Deleted

## 2020-12-14 MED ORDER — ATORVASTATIN CALCIUM 80 MG PO TABS: 80.0000 mg | ORAL_TABLET | Freq: Every day | ORAL | 3 refills | Status: DC

## 2020-12-14 MED ORDER — NITROGLYCERIN 0.4 MG SL SUBL: SUBLINGUAL_TABLET | SUBLINGUAL | 3 refills | Status: DC

## 2020-12-20 ENCOUNTER — Ambulatory Visit: Payer: BC Managed Care – PPO | Admitting: Family Medicine

## 2021-02-25 ENCOUNTER — Encounter: Attending: Family Medicine

## 2021-03-18 ENCOUNTER — Other Ambulatory Visit: Payer: Self-pay | Admitting: Family Medicine

## 2021-07-06 ENCOUNTER — Other Ambulatory Visit: Payer: Self-pay | Admitting: Family Medicine

## 2021-07-19 ENCOUNTER — Encounter: Attending: Family Medicine

## 2021-09-26 ENCOUNTER — Ambulatory Visit: Admit: 2021-09-26 | Discharge: 2021-09-26 | Payer: MEDICARE | Attending: Family Medicine | Primary: Family Medicine

## 2021-09-26 ENCOUNTER — Inpatient Hospital Stay: Admit: 2021-09-26 | Discharge: 2021-09-26 | Payer: MEDICARE | Primary: Family Medicine

## 2021-09-26 DIAGNOSIS — R3916 Straining to void: Secondary | ICD-10-CM

## 2021-09-26 DIAGNOSIS — N401 Enlarged prostate with lower urinary tract symptoms: Secondary | ICD-10-CM

## 2021-09-26 DIAGNOSIS — Z7689 Persons encountering health services in other specified circumstances: Secondary | ICD-10-CM

## 2021-09-26 LAB — PSA SCREENING: Screening PSA: 3.65 ng/mL (ref 0.000–4.000)

## 2021-09-26 MED ORDER — FLUTICASONE PROPIONATE 50 MCG/ACT NA SUSP
50 MCG/ACT | Freq: Every day | NASAL | 1 refills | Status: DC
Start: 2021-09-26 — End: 2022-10-30

## 2021-09-26 MED ORDER — LORATADINE 10 MG PO TABS
10 MG | ORAL_TABLET | Freq: Every day | ORAL | 1 refills | Status: DC
Start: 2021-09-26 — End: 2021-12-19

## 2021-09-26 MED ORDER — TAMSULOSIN HCL 0.4 MG PO CAPS
0.4 MG | ORAL_CAPSULE | Freq: Every day | ORAL | 1 refills | Status: DC
Start: 2021-09-26 — End: 2021-12-19

## 2021-09-26 NOTE — Assessment & Plan Note (Signed)
We will continue to monitor PSA.  He is willing to start tamsulosin to see if this helps his lower urinary tract symptoms.

## 2021-09-26 NOTE — Progress Notes (Signed)
CHIEF COMPLAINT:  Chief Complaint   Patient presents with    New Patient     Since January he has been sick multiple times, he is now having issues with hearing.        HISTORY OF PRESENT ILLNESS:  Mr. William Lloyd is a 66 y.o. male  who presents to establish care.  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  His right ear was always the ear he relied on for hearing.  Since January he has been having frequent head colds and a lot of congestion.  His right ear hearing is much worse.  He is not sure what to do.  He has not tried any medicines.  He moved from [place] to [place] about 1 year ago.  He does not recall any significant allergies in [place].  He has a history of a myocardial infarction in 2016.  He followed with cardiology yearly for a while but was then released.  He stays on aspirin and atorvastatin.  He is active walking his 2 dogs, he denies any chest pain or shortness of breath.  He has never had to use his nitroglycerin in the past since his stent.  He has a history of a bleeding ulcer years ago.  It was thought to be from ibuprofen use.  He has had no issues over the past few years.    PHQ:  PHQ-9  09/26/2021   Little interest or pleasure in doing things 0   Feeling down, depressed, or hopeless 0   PHQ-2 Score 0   PHQ-9 Total Score 0       CURRENT MEDICATION LIST:    Current Outpatient Medications   Medication Sig Dispense Refill    metoprolol tartrate (LOPRESSOR) 25 MG tablet Take 1 tablet by mouth daily      lisinopril (PRINIVIL;ZESTRIL) 10 MG tablet Take 1 tablet by mouth daily      atorvastatin (LIPITOR) 80 MG tablet Take 1 tablet by mouth nightly at bedtime.      calcium carb-cholecalciferol 600-20 MG-MCG TABS Take 1 tablet by mouth daily      Ginkgo Biloba 40 MG TABS Take 60 mg by mouth daily      nitroGLYCERIN (NITROSTAT) 0.4 MG SL tablet DISSOLVE ONE TABLET UNDER THE TONGUE EVERY 5 MINUTES AS NEEDED FOR CHEST PAIN.  DO NOT  EXCEED A TOTAL OF 3 DOSES IN 15 MINUTES      Multiple Vitamins-Minerals (MENS ONE DAILY PO) Take by mouth      vitamin D (CHOLECALCIFEROL) 25 MCG (1000 UT) TABS tablet Take 1 tablet by mouth daily      Garlic 1000 MG CAPS Take by mouth      zinc gluconate 50 MG tablet Take 1 tablet by mouth daily      omeprazole (PRILOSEC) 20 MG delayed release capsule Take 1 capsule by mouth daily      fluticasone (FLONASE) 50 MCG/ACT nasal spray 1 spray by Each Nostril route daily 32 g 1    loratadine (CLARITIN) 10 MG tablet Take 1 tablet by mouth daily 90 tablet 1    tamsulosin (FLOMAX) 0.4 MG capsule Take 1 capsule by mouth daily 90 capsule 1     No current facility-administered medications for this visit.        ALLERGIES:    Allergies   Allergen Reactions    Amoxicillin Hives        HISTORY:  Past Medical History:   Diagnosis Date    Hearing loss     History of heart attack     Hypertension     Kidney stone     NSTEMI (non-ST elevated myocardial infarction) (HCC) 10/20/2014    Last Assessment & Plan:  Formatting of this note might be different from the original. Coronary artery disease 10/2014  a. cath 10/23/2014 2.5 x 20 Synergy DES to the LAD, EF 40-45 percent b. relook cath 6/29, patent stents, medical management      Upper GI bleed 07/06/2015    Likely 2/2 NSAIDs, stable      Past Surgical History:   Procedure Laterality Date    EAR SURGERY Left     OTHER SURGICAL HISTORY      heart attack/ stent placement    OTHER SURGICAL HISTORY      bleeding ulcer    VASECTOMY        Social History     Socioeconomic History    Marital status: Married     Spouse name: Not on file    Number of children: Not on file    Years of education: Not on file    Highest education level: Not on file   Occupational History    Not on file   Tobacco Use    Smoking status: Never    Smokeless tobacco: Never   Vaping Use    Vaping Use: Never used   Substance and Sexual Activity    Alcohol use: Never    Drug use: Never    Sexual activity: Not on file   Other  Topics Concern    Not on file   Social History Narrative    Not on file     Social Determinants of Health     Financial Resource Strain: Not on file   Food Insecurity: Not on file   Transportation Needs: Not on file   Physical Activity: Not on file   Stress: Not on file   Social Connections: Not on file   Intimate Partner Violence: Not on file   Housing Stability: Not on file      Family History   Problem Relation Age of Onset    Mental Illness Mother     Dementia Mother     No Known Problems Sister     Other Son         blood clots    No Known Problems Daughter         REVIEW OF SYSTEMS:  Review of systems is as indicated in HPI otherwise negative.    PHYSICAL EXAM:  Physical Exam   Vital Signs -   Visit Vitals  BP 128/78   Pulse 67   Ht 5\' 9"  (1.753 m)   Wt 189 lb 2 oz (85.8 kg)   SpO2 97%   BMI 27.93 kg/m           GENERAL: The patient is in no apparent distress. Alert and oriented. Vital Signs Reviewed   HEENT: Head is normocephalic and atraumatic. Extraocular muscles are intact. Pupils are equal, Conjunctiva normal. Moist Mucous membranes. Posterior pharynx clear of any exudate or lesions. Ears with perforated TM B/L  NECK: Supple. No Lymphadenopathy.  No thyromegaly   LUNGS: Clear to auscultation bilaterally. Breath Sounds equal.   HEART: Regular rate and rhythm without murmur. No edema   ABDOMEN: Soft, nontender, and nondistended. .   MSK No deformity. Gait WNL. No swelling noted.   NEUROLOGIC:  Alert and Oriented. No focal deficits. PSYCHIATRIC: Cooperative, mood appropriate.   SKIN: No rash noted.        LABS  No results found for this visit on 09/26/21.  No results found for any previous visit.       IMPRESSION/PLAN    Encounter Diagnoses   Name Primary?    Establishing care with new doctor, encounter for Yes    Bilateral hearing loss, unspecified hearing loss type     Perforation of both tympanic membranes     Hyperlipidemia, unspecified hyperlipidemia type     Essential hypertension, benign     NSTEMI  (non-ST elevated myocardial infarction) (HCC)     Benign prostatic hyperplasia (BPH) with straining on urination     Allergic rhinitis, unspecified seasonality, unspecified trigger     Screening for prostate cancer     Overweight (BMI 25.0-29.9)     Upper GI bleed     Preventative health care      1. Establishing care with new doctor, encounter for  2. Bilateral hearing loss, unspecified hearing loss type  -     fluticasone (FLONASE) 50 MCG/ACT nasal spray; 1 spray by Each Nostril route daily, Disp-32 g, R-1Normal  -     loratadine (CLARITIN) 10 MG tablet; Take 1 tablet by mouth daily, Disp-90 tablet, R-1Normal  -     External Referral To Otolaryngology (ENT)  -     Comprehensive Metabolic Panel; Future  3. Perforation of both tympanic membranes  Assessment & Plan:   This is a very bothersome issue for him.  Sending to ear nose and throat.  Starting Flonase and Claritin as needed to try to help any eustachian tube dysfunction.  4. Hyperlipidemia, unspecified hyperlipidemia type  Overview:  Last Assessment & Plan:   Formatting of this note might be different from the original.  Due to recheck lipids.  Tolerating statin well.  Assessment & Plan:   Rechecking cholesterol, last LDL was excellent at 32.  Orders:  -     Comprehensive Metabolic Panel; Future  -     Lipid Panel; Future  -     Hemoglobin A1C; Future  5. Essential hypertension, benign  Overview:  Last Assessment & Plan:   Formatting of this note might be different from the original.  Pressure slightly elevated today.  Plan to recheck normally well controlled.  He said he had a very stressful day at work today and they have been working overtime.  Orders:  -     Comprehensive Metabolic Panel; Future  -     Hemoglobin A1C; Future  -     Microalbumin / Creatinine Urine Ratio; Future  6. NSTEMI (non-ST elevated myocardial infarction) [facility])  Overview:  Last Assessment & Plan:   Formatting of this note might be different from the original.  Coronary artery disease  10/2014   a. cath 10/23/2014 2.5 x 20 Synergy DES to the LAD, EF 40-45 percent b. relook cath 6/29, patent stents, medical management     Assessment & Plan:   He seems stable.  Has not seen cardiology in a few years.  Continue to monitor, can consider sending back to cardiology if needed.  Orders:  -     Comprehensive Metabolic Panel; Future  -     Lipid Panel; Future  -     Hemoglobin A1C; Future  7. Benign prostatic hyperplasia (BPH) with straining on urination  Overview:  Formatting of this note might be different from the original.  AUA score of 7 (mild).  Assessment & Plan:   We will continue to monitor PSA.  He is willing to start tamsulosin to see if this helps his lower urinary tract symptoms.  Orders:  -     tamsulosin (FLOMAX) 0.4 MG capsule; Take 1 capsule by mouth daily, Disp-90 capsule, R-1Normal  -     Comprehensive Metabolic Panel; Future  -     PSA Screening; Future  8. Allergic rhinitis, unspecified seasonality, unspecified trigger  -     External Referral To Otolaryngology (ENT)  -     Comprehensive Metabolic Panel; Future  9. Screening for prostate cancer  -     PSA Screening; Future  10. Overweight (BMI 25.0-29.9)  Assessment & Plan:   Checking A1c.  Orders:  -     Hemoglobin A1C; Future  11. Upper GI bleed  Overview:  Likely 2/2 NSAIDs, stable  Assessment & Plan:   Continue to avoid NSAIDs.  12. Preventative health care  Overview:  Labs- due 5/23  PSA- 3.3 5/22, following yearly, no family hx of ca  Cscope- due 2025  Flu shot fall 2023; moderna booster 2021; pna 20 2022; shingrix due; Tdap done 2022             Follow up and Dispositions:  Return in about 6 months (around 03/29/2022), or 6-12 months.       Wyline Copas, DO

## 2021-09-26 NOTE — Assessment & Plan Note (Signed)
Checking A1c.

## 2021-09-26 NOTE — Assessment & Plan Note (Signed)
He seems stable.  Has not seen cardiology in a few years.  Continue to monitor, can consider sending back to cardiology if needed.

## 2021-09-26 NOTE — Assessment & Plan Note (Signed)
Rechecking cholesterol, last LDL was excellent at 32.

## 2021-09-26 NOTE — Assessment & Plan Note (Signed)
This is a very bothersome issue for him.  Sending to ear nose and throat.  Starting Flonase and Claritin as needed to try to help any eustachian tube dysfunction.

## 2021-09-26 NOTE — Assessment & Plan Note (Signed)
Continue to avoid NSAIDs.

## 2021-09-27 ENCOUNTER — Encounter: Attending: Family Medicine | Primary: Family Medicine

## 2021-10-02 NOTE — Telephone Encounter (Signed)
Message relayed

## 2021-10-14 NOTE — Telephone Encounter (Signed)
Spoke with pt; pt states he thought he was supposed to have other lab results but will wait to see if they come soon. Pt states he will give the office a callback with questions or other concerns about labs

## 2021-10-31 MED ORDER — METOPROLOL TARTRATE 25 MG PO TABS
25 MG | ORAL_TABLET | Freq: Every day | ORAL | 12 refills | Status: DC
Start: 2021-10-31 — End: 2021-10-31

## 2021-10-31 NOTE — Telephone Encounter (Signed)
From: Virgel Manifold  To: Dr. Wyline Copas  Sent: 10/31/2021 7:48 AM EDT  Subject: Refill my medications    Good morning Dr. Olena Leatherwood - Please have my metoprolol medication refilled. I tried doing it through My Chart account but it says my medicine is not refillable through My Chart. Please send the refill to the [facility] listed on my records. I appreciate your immediate attention and response.     Thank you for your assistance. Have a great day!     William Lloyd

## 2021-10-31 NOTE — Telephone Encounter (Signed)
Lov 09/26/2021  Nov 03/31/2022

## 2021-10-31 NOTE — Telephone Encounter (Signed)
Lov 09/26/2021  Nov 03/31/2022

## 2021-11-02 MED ORDER — METOPROLOL TARTRATE 25 MG PO TABS
25 MG | ORAL_TABLET | Freq: Every day | ORAL | 12 refills | Status: DC
Start: 2021-11-02 — End: 2021-12-19

## 2021-11-02 NOTE — Telephone Encounter (Signed)
Med sent

## 2021-12-19 ENCOUNTER — Encounter

## 2021-12-19 MED ORDER — TAMSULOSIN HCL 0.4 MG PO CAPS
0.4 MG | ORAL_CAPSULE | Freq: Every day | ORAL | 1 refills | Status: DC
Start: 2021-12-19 — End: 2022-05-22

## 2021-12-19 MED ORDER — METOPROLOL TARTRATE 25 MG PO TABS
25 | ORAL_TABLET | Freq: Every day | ORAL | 12 refills | 30.00000 days | Status: DC
Start: 2021-12-19 — End: 2023-10-12

## 2021-12-19 MED ORDER — LORATADINE 10 MG PO TABS
10 MG | ORAL_TABLET | Freq: Every day | ORAL | 1 refills | Status: DC
Start: 2021-12-19 — End: 2022-11-03

## 2021-12-19 MED ORDER — ATORVASTATIN CALCIUM 80 MG PO TABS
80 MG | ORAL_TABLET | Freq: Every evening | ORAL | 3 refills | Status: AC
Start: 2021-12-19 — End: 2022-12-29

## 2021-12-19 NOTE — Telephone Encounter (Signed)
Lov 09/26/2021  Nov 03/31/2022

## 2021-12-19 NOTE — Telephone Encounter (Signed)
From: William Lloyd  To: Dr. Wyline Copas  Sent: 12/19/2021 8:19 AM EDT  Subject: Atorvastatin Refill    Good morning Dr. Olena Leatherwood - please submit a 90-day refill for my Atorvastatin 80 mg medication. I cannot order the refill through my chart so please submit the refill request to my pharmacy listed on My Chart. I also requested refills on 3 medications that I was able to order through My Chart.   Thank you for your help, Dr. Olena Leatherwood.   Have a great day!   William Lloyd  [phone]

## 2022-01-17 NOTE — Telephone Encounter (Signed)
Pt would like to stay at this office with Dr. Dietrich Pates. Please call to schedule AWV for late november

## 2022-01-23 NOTE — Telephone Encounter (Signed)
Scheduled.

## 2022-03-03 ENCOUNTER — Encounter

## 2022-03-31 ENCOUNTER — Encounter: Attending: Family Medicine | Primary: Family Medicine

## 2022-04-03 ENCOUNTER — Ambulatory Visit: Admit: 2022-04-03 | Discharge: 2022-04-03 | Payer: MEDICARE | Attending: Family Medicine | Primary: Family Medicine

## 2022-04-03 ENCOUNTER — Encounter

## 2022-04-03 DIAGNOSIS — Z0000 Encounter for general adult medical examination without abnormal findings: Secondary | ICD-10-CM

## 2022-04-03 LAB — CBC WITH AUTO DIFFERENTIAL
Absolute Baso #: 0 10*3/uL (ref 0.0–0.2)
Absolute Eos #: 0.1 10*3/uL (ref 0.0–0.5)
Absolute Lymph #: 1.1 10*3/uL (ref 1.0–3.2)
Absolute Mono #: 0.8 10*3/uL (ref 0.3–1.0)
Basophils %: 0.3 % (ref 0.0–2.0)
Eosinophils %: 1 % (ref 0.0–7.0)
Hematocrit: 46 % (ref 38.0–52.0)
Hemoglobin: 15.2 g/dL (ref 13.0–17.3)
Immature Grans (Abs): 0.03 10*3/uL (ref 0.00–0.06)
Immature Granulocytes %: 0.3 % (ref 0.0–0.6)
Lymphocytes: 13 % — ABNORMAL LOW (ref 15.0–45.0)
MCH: 30.8 pg (ref 27.0–34.5)
MCHC: 33 g/dL (ref 32.0–36.0)
MCV: 93.1 fL (ref 84.0–100.0)
MPV: 10.2 fL (ref 7.2–13.2)
Monocytes %: 9.5 % (ref 4.0–12.0)
NRBC Absolute: 0 10*3/uL (ref 0.000–0.012)
NRBC Automated: 0 % (ref 0.0–0.2)
Neutrophils %: 75.9 % — ABNORMAL HIGH (ref 42.0–74.0)
Neutrophils Absolute: 6.6 10*3/uL (ref 1.6–7.3)
Platelets: 202 10*3/uL (ref 140–440)
RBC: 4.94 x10e6/mcL (ref 4.00–5.60)
RDW: 13.4 % (ref 11.0–16.0)
WBC: 8.8 10*3/uL (ref 3.8–10.6)

## 2022-04-03 LAB — COMPREHENSIVE METABOLIC PANEL
ALT: 37 U/L (ref 0–50)
AST: 37 U/L (ref 0–50)
Albumin/Globulin Ratio: 2.1 (ref 1.00–2.70)
Albumin: 4.6 g/dL (ref 3.5–5.2)
Alk Phosphatase: 129 U/L (ref 40–130)
Anion Gap: 12 mmol/L (ref 2–17)
BUN: 14 mg/dL (ref 8–23)
CO2: 27 mmol/L (ref 22–29)
Calcium: 9.9 mg/dL (ref 8.8–10.2)
Chloride: 102 mmol/L (ref 98–107)
Creatinine: 1.3 mg/dL (ref 0.7–1.3)
Est, Glom Filt Rate: 61 mL/min/1.73m (ref >=60)
Globulin: 2.2 g/dL (ref 1.9–4.4)
Glucose: 101 mg/dL — ABNORMAL HIGH (ref 70–99)
OSMOLALITY CALCULATED: 282 mOsm/kg (ref 270–287)
Potassium: 4.9 mmol/L (ref 3.5–5.3)
Sodium: 141 mmol/L (ref 135–145)
Total Bilirubin: 0.65 mg/dL (ref 0.00–1.20)
Total Protein: 6.8 g/dL (ref 6.4–8.3)

## 2022-04-03 LAB — LIPID PANEL
Chol/HDL Ratio: 2 (ref 0.0–4.4)
Cholesterol: 110 mg/dL (ref 100–200)
HDL: 55 mg/dL (ref >=40)
LDL Cholesterol: 36.6 mg/dL (ref 0.0–100.0)
LDL/HDL Ratio: 0.7
Triglycerides: 92 mg/dL (ref 0–149)
VLDL: 18.4 mg/dL (ref 5.0–40.0)

## 2022-04-03 LAB — TSH WITH REFLEX: TSH: 2.23 mcIU/mL (ref 0.358–3.740)

## 2022-04-03 NOTE — Patient Instructions (Signed)
Hearing Loss: Care Instructions  Overview     Hearing loss is a sudden or slow decrease in how well you hear. It can range from slight to profound. Permanent hearing loss can occur with aging. It also can happen when you are exposed long-term to loud noise. Examples include listening to loud music, riding motorcycles, or being around other loud machines.  Hearing loss can affect your work and home life. It can make you feel lonely or depressed. You may feel that you have lost your independence. But hearing aids and other devices can help you hear better and feel connected to others.  Follow-up care is a key part of your treatment and safety. Be sure to make and go to all appointments, and call your doctor if you are having problems. It's also a good idea to know your test results and keep a list of the medicines you take.  How can you care for yourself at home?  Avoid loud noises whenever possible. This helps keep your hearing from getting worse.  Always wear hearing protection around loud noises.  Wear a hearing aid as directed.  A professional can help you pick a hearing aid that will work best for you.  You can also get hearing aids over the counter for mild to moderate hearing loss.  Have hearing tests as your doctor suggests. They can show whether your hearing has changed. Your hearing aid may need to be adjusted.  Use other devices as needed. These may include:  Telephone amplifiers and hearing aids that can connect to a television, stereo, radio, or microphone.  Devices that use lights or vibrations. These alert you to the doorbell, a ringing telephone, or a baby monitor.  Television closed-captioning. This shows the words at the bottom of the screen. Most new TVs can do this.  TTY (text telephone). This lets you type messages back and forth on the telephone instead of talking or listening. These devices are also called TDD. When messages are typed on the keyboard, they are sent over the phone line to a  receiving TTY. The message is shown on a monitor.  Use text messaging, social media, and email if it is hard for you to communicate by telephone.  Try to learn a listening technique called speechreading. It is not lipreading. You pay attention to people's gestures, expressions, posture, and tone of voice. These clues can help you understand what a person is saying. Face the person you are talking to, and have them face you. Make sure the lighting is good. You need to see the other person's face clearly.  Think about counseling if you need help to adjust to your hearing loss.  When should you call for help?  Watch closely for changes in your health, and be sure to contact your doctor if:   You think your hearing is getting worse.    You have new symptoms, such as dizziness or nausea.   Where can you learn more?  Go to https://www.bennett.info/ and enter R798 to learn more about "Hearing Loss: Care Instructions."  Current as of: February 28, 2023Content Version: 13.8   2006-2023 Healthwise, Incorporated.   Care instructions adapted under license by [facility]. If you have questions about a medical condition or this instruction, always ask your healthcare professional. [place] any warranty or liability for your use of this information.           Advance Directives: Care Instructions  Overview  An advance directive is  a legal way to state your wishes at the end of your life. It tells your family and your doctor what to do if you can't say what you want.  There are two main types of advance directives. You can change them any time your wishes change.  Living will.  This form tells your family and your doctor your wishes about life support and other treatment. The form is also called a declaration.  Medical power of attorney.  This form lets you name a person to make treatment decisions for you when you can't speak for yourself. This person is called a health care  agent (health care proxy, health care surrogate). The form is also called a durable power of attorney for health care.  If you do not have an advance directive, decisions about your medical care may be made by a family member, or by a doctor or a judge who doesn't know you.  It may help to think of an advance directive as a gift to the people who care for you. If you have one, they won't have to make tough decisions by themselves.  For more information, including forms for your state, see the [place] website (Rebanking[employer].hu).  Follow-up care is a key part of your treatment and safety. Be sure to make and go to all appointments, and call your doctor if you are having problems. It's also a good idea to know your test results and keep a list of the medicines you take.  What should you include in an advance directive?  Many states have a unique advance directive form. (It may ask you to address specific issues.) Or you might use a universal form that's approved by many states.  If your form doesn't tell you what to address, it may be hard to know what to include in your advance directive. Use the questions below to help you get started.  Who do you want to make decisions about your medical care if you are not able to?  What life-support measures do you want if you have a serious illness that gets worse over time or can't be cured?  What are you most afraid of that might happen? (Maybe you're afraid of having pain, losing your independence, or being kept alive by machines.)  Where would you prefer to die? (Your home? A hospital? A nursing home?)  Do you want to donate your organs when you die?  Do you want certain religious practices performed before you die?  When should you call for help?  Be sure to contact your doctor if you have any questions.  Where can you learn more?  Go to https://www.bennett.info/ and enter R264 to learn more about "Advance Directives: Care  Instructions."  Current as of: March 26, 2023Content Version: 13.8   2006-2023 Healthwise, Incorporated.   Care instructions adapted under license by [facility]. If you have questions about a medical condition or this instruction, always ask your healthcare professional. [place] any warranty or liability for your use of this information.           Starting a Weight Loss Plan: Care Instructions  Overview     If you're thinking about losing weight, it can be hard to know where to start. Your doctor can help you set up a weight loss plan that best meets your needs. You may want to take a class on nutrition or exercise, or you could join a weight loss support group. If you have questions about  how to make changes to your eating or exercise habits, ask your doctor about seeing a registered dietitian or an exercise specialist.  It can be a big challenge to lose weight. But you don't have to make huge changes at once. Make small changes, and stick with them. When those changes become habit, add a few more changes.  If you don't think you're ready to make changes right now, try to pick a date in the future. Make an appointment to see your doctor to discuss whether the time is right for you to start a plan.  Follow-up care is a key part of your treatment and safety. Be sure to make and go to all appointments, and call your doctor if you are having problems. It's also a good idea to know your test results and keep a list of the medicines you take.  How can you care for yourself at home?  Set realistic goals. Many people expect to lose much more weight than is likely. A weight loss of 5% to 10% of your body weight may be enough to improve your health.  Get family and friends involved to provide support. Talk to them about why you are trying to lose weight, and ask them to help. They can help by participating in exercise and having meals with you, even if they may be eating something  different.  Find what works best for you. If you do not have time or do not like to cook, a program that offers meal replacement bars or shakes may be better for you. Or if you like to prepare meals, finding a plan that includes daily menus and recipes may be best.  Ask your doctor about other health professionals who can help you achieve your weight loss goals.  A dietitian can help you make healthy changes in your diet.  An exercise specialist or personal trainer can help you develop a safe and effective exercise program.  A counselor or psychiatrist can help you cope with issues such as depression, anxiety, or family problems that can make it hard to focus on weight loss.  Consider joining a support group for people who are trying to lose weight. Your doctor can suggest groups in your area.  Where can you learn more?  Go to https://www.bennett.info/ and enter U357 to learn more about "Starting a Weight Loss Plan: Care Instructions."  Current as of: February 28, 2023Content Version: 13.8   2006-2023 Healthwise, Incorporated.   Care instructions adapted under license by [facility]. If you have questions about a medical condition or this instruction, always ask your healthcare professional. [place] any warranty or liability for your use of this information.           A Healthy Heart: Care Instructions  Your Care Instructions     Coronary artery disease, also called heart disease, occurs when a substance called plaque builds up in the vessels that supply oxygen-rich blood to your heart muscle. This can narrow the blood vessels and reduce blood flow. A heart attack happens when blood flow is completely blocked. A high-fat diet, smoking, and other factors increase the risk of heart disease.  Your doctor has found that you have a chance of having heart disease. You can do lots of things to keep your heart healthy. It may not be easy, but you can change your diet,  exercise more, and quit smoking. These steps really work to lower your chance of heart disease.  Follow-up care is  a key part of your treatment and safety. Be sure to make and go to all appointments, and call your doctor if you are having problems. It's also a good idea to know your test results and keep a list of the medicines you take.  How can you care for yourself at home?  Diet   Use less salt when you cook and eat. This helps lower your blood pressure. Taste food before salting. Add only a little salt when you think you need it. With time, your taste buds will adjust to less salt.    Eat fewer snack items, fast foods, canned soups, and other high-salt, high-fat, processed foods.    Read food labels and try to avoid saturated and trans fats. They increase your risk of heart disease by raising cholesterol levels.    Limit the amount of solid fat-butter, margarine, and shortening-you eat. Use olive, peanut, or canola oil when you cook. Bake, broil, and steam foods instead of frying them.    Eat a variety of fruit and vegetables every day. Dark green, deep orange, red, or yellow fruits and vegetables are especially good for you. Examples include spinach, carrots, peaches, and berries.    Foods high in fiber can reduce your cholesterol and provide important vitamins and minerals. High-fiber foods include whole-grain cereals and breads, oatmeal, beans, brown rice, citrus fruits, and apples.    Eat lean proteins. Heart-healthy proteins include seafood, lean meats and poultry, eggs, beans, peas, nuts, seeds, and soy products.    Limit drinks and foods with added sugar. These include candy, desserts, and soda pop.   Lifestyle changes   If your doctor recommends it, get more exercise. Walking is a good choice. Bit by bit, increase the amount you walk every day. Try for at least 30 minutes on most days of the week. You also may want to swim, bike, or do other activities.    Do not smoke. If you need help  quitting, talk to your doctor about stop-smoking programs and medicines. These can increase your chances of quitting for good. Quitting smoking may be the most important step you can take to protect your heart. It is never too late to quit.    Limit alcohol to 2 drinks a day for men and 1 drink a day for women. Too much alcohol can cause health problems.    Manage other health problems such as diabetes, high blood pressure, and high cholesterol. If you think you may have a problem with alcohol or drug use, talk to your doctor.   Medicines   Take your medicines exactly as prescribed. Call your doctor if you think you are having a problem with your medicine.    If your doctor recommends aspirin, take the amount directed each day. Make sure you take aspirin and not another kind of pain reliever, such as acetaminophen (Tylenol).   When should you call for help?   Call 911 if you have symptoms of a heart attack. These may include:   Chest pain or pressure, or a strange feeling in the chest.    Sweating.    Shortness of breath.    Pain, pressure, or a strange feeling in the back, neck, jaw, or upper belly or in one or both shoulders or arms.    Lightheadedness or sudden weakness.    A fast or irregular heartbeat.   After you call 911, the operator may tell you to chew 1 adult-strength or 2 to 4 low-dose aspirin.  Wait for an ambulance. Do not try to drive yourself.  Watch closely for changes in your health, and be sure to contact your doctor if you have any problems.  Where can you learn more?  Go to https://www.bennett.info/ and enter F075 to learn more about "A Healthy Heart: Care Instructions."  Current as of: June 25, 2023Content Version: 13.8   2006-2023 Healthwise, Incorporated.   Care instructions adapted under license by [facility]. If you have questions about a medical condition or this instruction, always ask your healthcare professional. [place]  any warranty or liability for your use of this information.      Personalized Preventive Plan for William Lloyd - 04/03/2022  Medicare offers a range of preventive health benefits. Some of the tests and screenings are paid in full while other may be subject to a deductible, co-insurance, and/or copay.    Some of these benefits include a comprehensive review of your medical history including lifestyle, illnesses that may run in your family, and various assessments and screenings as appropriate.    After reviewing your medical record and screening and assessments performed today your provider may have ordered immunizations, labs, imaging, and/or referrals for you.  A list of these orders (if applicable) as well as your Preventive Care list are included within your After Visit Summary for your review.    Other Preventive Recommendations:    A preventive eye exam performed by an eye specialist is recommended every 1-2 years to screen for glaucoma; cataracts, macular degeneration, and other eye disorders.  A preventive dental visit is recommended every 6 months.  Try to get at least 150 minutes of exercise per week or 10,000 steps per day on a pedometer .  Order or download the FREE "Exercise & Physical Activity: Your Everyday Guide" from [employer]. Call 217-508-5106 or search [employer] online.  You need 1200-1500 mg of calcium and 1000-2000 IU of vitamin D per day. It is possible to meet your calcium requirement with diet alone, but a vitamin D supplement is usually necessary to meet this goal.  When exposed to the sun, use a sunscreen that protects against both UVA and UVB radiation with an SPF of 30 or greater. Reapply every 2 to 3 hours or after sweating, drying off with a towel, or swimming.  Always wear a seat belt when traveling in a car. Always wear a helmet when riding a bicycle or motorcycle.

## 2022-04-03 NOTE — Progress Notes (Signed)
Medicare Annual Wellness Visit    William Lloyd is here for Medicare AWV (No issues at this time )    Assessment & Plan   Medicare annual wellness visit, subsequent  Comments:  discussed updating vaccines   Need for influenza vaccination  -     Influenza, FLUZONE HIGH-DOSE, (age 66 y+), IM, Preservative Free, 0.7 mL  Hyperlipidemia, unspecified hyperlipidemia type  Comments:  continue with statin  well tolerated   Orders:  -     Lipid Panel; Future  Essential hypertension, benign  Comments:  stable  BP has been at goal  138/74  Orders:  -     CBC with Auto Differential; Future  -     Comprehensive Metabolic Panel; Future  S/P drug eluting coronary stent placement  Comments:  continue wi\th statin and BB  denies any CP and no SOB   Orders:  -     [facility] - Pinto, Geetha,MD, Cardiology, [place]  Hx of peptic ulcer  Comments:  no recurrence  avoid NSAID   Change in weight  -     TSH with Reflex; Future    Recommendations for Preventive Services Due: see orders and patient instructions/AVS.  Recommended screening schedule for the next 5-10 years is provided to the patient in written form: see Patient Instructions/AVS.     Return in about 6 months (around 10/02/2022).     Subjective   The following acute and/or chronic problems were also addressed today:   a 66 y.o. male  who presents to establish care.  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  His right ear was always the ear he relied on for hearing, he is now have ear tubes and has been feeling better   He has a history of a myocardial infarction in 2016.  He followed with cardiology yearly for a while but was then released.  He stays on aspirin and atorvastatin.  He is active walking his 2 dogs, he denies any chest pain or shortness of breath.  He has never had to use his nitroglycerin in the past since his stent.  He has a history of a bleeding ulcer years ago.  It was thought to be from ibuprofen use.   He has had no issues over the past few years.    Patient's complete Health Risk Assessment and screening values have been reviewed and are found in Flowsheets. The following problems were reviewed today and where indicated follow up appointments were made and/or referrals ordered.    Positive Risk Factor Screenings with Interventions:                 Weight and Activity:  Physical Activity: Sufficiently Active (04/03/2022)    Exercise Vital Sign     Days of Exercise per Week: 3 days     Minutes of Exercise per Session: 60 min     On average, how many days per week do you engage in moderate to strenuous exercise (like a brisk walk)?: 3 days  Have you lost any weight without trying in the past 3 months?: No  Body mass index is 30.9 kg/m. (!) Abnormal  Obesity Interventions:  Patient advised to follow-up in this office for further evaluation and treatment           Hearing Screen:  Do you or your family notice any trouble with your hearing that hasn't been managed with hearing aids?: (!) Yes  Interventions:  Follow up with ENT as scheduled        Advanced Directives:  Do you have a Living Will?: (!) No    Intervention:                Objective   Vitals:    04/03/22 0844 04/03/22 0927   BP: (!) 140/82 138/74   Pulse: 79    SpO2: 98%    Weight: 94.9 kg (209 lb 4 oz)    Height: 1.753 m (5\' 9" )       Body mass index is 30.9 kg/m.        General Appearance: alert and oriented to person, place and time, well developed and well- nourished, in no acute distress  Skin: warm and dry, no rash or erythema  Head: normocephalic and atraumatic  Eyes: pupils equal, round, and reactive to light, extraocular eye movements intact, conjunctivae normal  ENT: tympanic membrane, external ear and ear canal normal bilaterally, nose without deformity, nasal mucosa and turbinates normal without polyps  Neck: supple and non-tender without mass, no thyromegaly or thyroid nodules, no cervical lymphadenopathy  Pulmonary/Chest: clear to  auscultation bilaterally- no wheezes, rales or rhonchi, normal air movement, no respiratory distress  Cardiovascular: normal rate, regular rhythm, normal S1 and S2, no murmurs, rubs, clicks, or gallops, distal pulses intact, no carotid bruits  Abdomen: soft, non-tender, non-distended, normal bowel sounds, no masses or organomegaly  Extremities: no cyanosis, clubbing or edema  Musculoskeletal: normal range of motion, no joint swelling, deformity or tenderness  Neurologic: reflexes normal and symmetric, no cranial nerve deficit, gait, coordination and speech normal       Allergies   Allergen Reactions    Amoxicillin Hives     Prior to Visit Medications    Medication Sig Taking? Authorizing Provider   loratadine (CLARITIN) 10 MG tablet Take 1 tablet by mouth daily Yes , Danielle C, DO   tamsulosin (FLOMAX) 0.4 MG capsule Take 1 capsule by mouth daily Yes Olena Leatherwood, Danielle C, DO   metoprolol tartrate (LOPRESSOR) 25 MG tablet Take 1 tablet by mouth daily Yes Olena Leatherwood, Danielle C, DO   atorvastatin (LIPITOR) 80 MG tablet Take 1 tablet by mouth nightly at bedtime. Yes Olena Leatherwood, Danielle C, DO   lisinopril (PRINIVIL;ZESTRIL) 10 MG tablet Take 1 tablet by mouth daily Yes [provider]   calcium carb-cholecalciferol 600-20 MG-MCG TABS Take 1 tablet by mouth daily Yes [provider]   Ginkgo Biloba 40 MG TABS Take 60 mg by mouth daily Yes [provider]   nitroGLYCERIN (NITROSTAT) 0.4 MG SL tablet DISSOLVE ONE TABLET UNDER THE TONGUE EVERY 5 MINUTES AS NEEDED FOR CHEST PAIN.  DO NOT EXCEED A TOTAL OF 3 DOSES IN 15 MINUTES Yes [provider]   Multiple Vitamins-Minerals (MENS ONE DAILY PO) Take by mouth Yes [provider]   vitamin D (CHOLECALCIFEROL) 25 MCG (1000 UT) TABS tablet Take 1 tablet by mouth daily Yes [provider]   Garlic 1000 MG CAPS Take by mouth Yes [provider]   zinc gluconate 50 MG tablet Take 1 tablet by mouth daily Yes [provider]   omeprazole (PRILOSEC) 20 MG delayed release capsule Take 1 capsule by mouth daily Yes [provider]   fluticasone (FLONASE) 50 MCG/ACT nasal spray 1 spray by Each Nostril route daily Yes Olena Leatherwood, DO       CareTeam (Including outside providers/suppliers regularly involved in providing care):   Patient Care Team:  Benjiman Core, Janae Sauce, MD as PCP - General (Family Medicine)  Wyline Copas, DO as PCP - Empaneled Provider     Reviewed and updated this visit:  Tobacco  Allergies  Meds  Med Hx  Surg Hx  Soc Hx  Fam Hx

## 2022-04-18 NOTE — Telephone Encounter (Signed)
LMOVM to confirm appt 12-22 @ 10:00

## 2022-04-25 ENCOUNTER — Encounter: Payer: MEDICARE | Attending: Internal Medicine | Primary: Family Medicine

## 2022-05-21 NOTE — Telephone Encounter (Signed)
Good morning!    The patient called in regarding questions for tomorrow's appointment with Dr. Vennie Homans at 2pm.    The patient can be reached at [phone]. Thanks!

## 2022-05-22 ENCOUNTER — Ambulatory Visit: Admit: 2022-05-22 | Discharge: 2022-05-22 | Payer: MEDICARE | Attending: Internal Medicine | Primary: Family Medicine

## 2022-05-22 DIAGNOSIS — I249 Acute ischemic heart disease, unspecified: Secondary | ICD-10-CM

## 2022-05-22 NOTE — Patient Instructions (Signed)
[facility]RON BP machine, check BP two to three times a week   Echo   Stress test (take beta blocker)   Follow up in 3 months   Please go to the following websites for guidance on heart healthy nutrition and lifestyle advice.  www.nutritionfacts.org  www.dashdiet.org  www.theplantfedgut.com   www.cardiosmart.org   Outlive by Corena Pilgrim MD

## 2022-05-22 NOTE — Telephone Encounter (Signed)
Called and left a voicemail.

## 2022-05-22 NOTE — Progress Notes (Signed)
Review of Systems   Constitutional:  Negative for chills, fatigue, fever and unexpected weight change.   HENT:  Positive for hearing loss. Negative for nosebleeds, sore throat, tinnitus and voice change.    Eyes:  Negative for visual disturbance.   Respiratory:  Negative for apnea, cough, chest tightness, shortness of breath and wheezing.    Cardiovascular:  Negative for chest pain, palpitations and leg swelling.   Gastrointestinal:  Negative for anal bleeding, blood in stool, constipation and diarrhea.   Endocrine: Negative for polydipsia.   Genitourinary:  Negative for difficulty urinating, dysuria, hematuria and urgency.   Musculoskeletal:  Negative for myalgias.   Skin:  Negative for rash and wound.   Neurological:  Negative for dizziness, syncope, weakness, light-headedness and numbness.   Hematological:  Does not bruise/bleed easily.   Psychiatric/Behavioral:  Negative for sleep disturbance.

## 2022-05-22 NOTE — Progress Notes (Signed)
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  He recently moved from [place] to be closer to his fianc's family.  He was seen by primary care physician referred to cardiology.  He has not had any chest pains on exertion or shortness of breath PND orthopnea lower extremity edema.  No lightheadedness dizziness or syncope.  Blood pressure is elevated today although he states that his blood pressures have been well-controlled.  Is exercising by walking 2 miles daily.  -    PAST MEDICAL, SOCIAL AND FAMILY HISTORY          Past Medical History:    Past Medical History:   Diagnosis Date    Hearing loss     History of heart attack     Hypertension     Kidney stone     NSTEMI (non-ST elevated myocardial infarction) (HCC) 10/20/2014    Last Assessment & Plan:  Formatting of this note might be different from the original. Coronary artery disease 10/2014  a. cath 10/23/2014 2.5 x 20 Synergy DES to the LAD, EF 40-45 percent b. relook cath 6/29, patent stents, medical management      Upper GI bleed 07/06/2015    Likely 2/2 NSAIDs, stable        Past Surgical History:    Past Surgical History:   Procedure Laterality Date    EAR SURGERY Left     OTHER SURGICAL HISTORY      heart attack/ stent placement    OTHER SURGICAL HISTORY      bleeding ulcer    VASECTOMY          Social History:    Social History     Tobacco Use    Smoking status: Never    Smokeless tobacco: Never   Vaping Use     Vaping Use: Never used   Substance Use Topics    Alcohol use: Never    Drug use: Never   Retired, used to work  which Astronomer   Does not exercise much at this time      Family History:   Family History   Problem Relation Age of Onset    Mental Illness Mother     Dementia Mother     No Known Problems Sister     Other Son         blood clots    No Known Problems Daughter           MEDICATIONS AND ALLERGIES         Current Outpatient Medications   Medication Sig Dispense Refill    ticagrelor (BRILINTA) 90 MG TABS tablet Take 1 tablet by mouth in the morning and at bedtime      isosorbide mononitrate (IMDUR) 30 MG extended release tablet Take 1 tablet by mouth daily      loratadine (CLARITIN) 10 MG tablet Take 1 tablet by mouth daily 90 tablet 1    metoprolol tartrate (LOPRESSOR)  25 MG tablet Take 1 tablet by mouth daily 60 tablet 12    atorvastatin (LIPITOR) 80 MG tablet Take 1 tablet by mouth nightly at bedtime. 90 tablet 3    lisinopril (PRINIVIL;ZESTRIL) 10 MG tablet Take 1 tablet by mouth daily      calcium carb-cholecalciferol 600-20 MG-MCG TABS Take 1 tablet by mouth daily      Ginkgo Biloba 40 MG TABS Take 60 mg by mouth daily      nitroGLYCERIN (NITROSTAT) 0.4 MG SL tablet DISSOLVE ONE TABLET UNDER THE TONGUE EVERY 5 MINUTES AS NEEDED FOR CHEST PAIN.  DO NOT EXCEED A TOTAL OF 3 DOSES IN 15 MINUTES      Multiple Vitamins-Minerals (MENS ONE DAILY PO) Take by mouth      vitamin D (CHOLECALCIFEROL) 25 MCG (1000 UT) TABS tablet Take 1 tablet by mouth daily      Garlic 1610 MG CAPS Take by mouth      zinc gluconate 50 MG tablet Take 1 tablet by mouth daily      omeprazole (PRILOSEC) 20 MG delayed release capsule Take 1 capsule by mouth daily      fluticasone (FLONASE) 50 MCG/ACT nasal spray 1 spray by Each Nostril route daily 32 g 1    tamsulosin (FLOMAX) 0.4 MG capsule Take 1 capsule by mouth daily (Patient not taking: Reported on 05/22/2022) 90 capsule 1     No current facility-administered  medications for this visit.            Allergies:    Allergies   Allergen Reactions    Amoxicillin Hives     Other reaction(s): Not Indicated          REVIEW OF SYSTEMS       Review of Systems   Constitutional:  Negative for chills, fatigue, fever and unexpected weight change.   HENT:  Positive for hearing loss. Negative for nosebleeds, sore throat, tinnitus and voice change.    Eyes:  Negative for visual disturbance.   Respiratory:  Negative for apnea, cough, chest tightness, shortness of breath and wheezing.    Cardiovascular:  Negative for chest pain, palpitations and leg swelling.   Gastrointestinal:  Negative for anal bleeding, blood in stool, constipation and diarrhea.   Endocrine: Negative for polydipsia.   Genitourinary:  Negative for difficulty urinating, dysuria, hematuria and urgency.   Musculoskeletal:  Negative for myalgias.   Skin:  Negative for rash and wound.   Neurological:  Negative for dizziness, syncope, weakness, light-headedness and numbness.   Hematological:  Does not bruise/bleed easily.   Psychiatric/Behavioral:  Negative for sleep disturbance.           PHYSICAL EXAM          BP (!) 160/84 (Site: Right Upper Arm, Position: Sitting, Cuff Size: Small Adult)   Pulse 77   Resp 17   Ht 1.753 m (5\' 9" )   Wt 93 kg (205 lb)   SpO2 97%   BMI 30.27 kg/m      Physical Exam  Constitutional:       General: He is not in acute distress.     Appearance: Normal appearance. He is not ill-appearing or diaphoretic.   HENT:      Head: Normocephalic and atraumatic.      Nose: No congestion.      Mouth/Throat:      Mouth: Mucous membranes are moist.   Neck:      Vascular: No carotid bruit.   Cardiovascular:  Rate and Rhythm: Normal rate and regular rhythm.      Heart sounds: No murmur heard.     No friction rub. No gallop.   Pulmonary:      Effort: Pulmonary effort is normal. No respiratory distress.      Breath sounds: Normal breath sounds. No stridor. No wheezing, rhonchi or rales.   Chest:       Chest wall: No tenderness.   Abdominal:      General: There is no distension.      Tenderness: There is no abdominal tenderness.   Musculoskeletal:         General: No swelling.      Right lower leg: No edema.      Left lower leg: No edema.   Skin:     General: Skin is warm and dry.   Neurological:      General: No focal deficit present.      Mental Status: He is alert. Mental status is at baseline.   Psychiatric:         Mood and Affect: Mood normal.              CARDIAC DIAGNOSTICS          EKG done today showed sinus rhythm with non-specific STT changes       ASSESSMENT AND RECOMMENDATIONS      IMPRESSION:   CAD s/p MI/ACS in 2016 with DES to mild LAD   Mild ischemic CM EF 40-45%, no evidence CHF  Hypertensive heart diease   Hypercholesterolemia   Family history of premature CAD     RECOMMENDATIONS:   Continue ASA, statin, BB   Check BP at home and bring log   Check Echo   Check exercise nuclear stress test   Nutrition and lifestyle with exercise discussed, refernces given   Consider check Lipoprotein (a) on next lipid panel   He will call us if BP continues to be elevated   Follow up in 3 months       Thank you for allowing me to participate in the care of your patient.    Lynann Bologna, MD   [facility] / [facility]

## 2022-05-23 NOTE — Telephone Encounter (Signed)
Patient was seen by Dr. Vennie Homans on 05/22/2022.

## 2022-06-11 NOTE — Telephone Encounter (Signed)
LVM to confirm apt and go over directions for stress test

## 2022-06-12 ENCOUNTER — Ambulatory Visit: Admit: 2022-06-12 | Discharge: 2022-07-14 | Payer: MEDICARE | Primary: Family Medicine

## 2022-06-12 DIAGNOSIS — I249 Acute ischemic heart disease, unspecified: Secondary | ICD-10-CM

## 2022-06-12 MED ORDER — TECHNETIUM TC 99M SESTAMIBI IV KIT
Freq: Once | INTRAVENOUS | Status: AC | PRN
Start: 2022-06-12 — End: 2022-06-12
  Administered 2022-06-12: 17:00:00 30 via INTRAVENOUS

## 2022-06-12 MED ORDER — METOPROLOL TARTRATE 5 MG/5ML IV SOLN
5 | Freq: Once | INTRAVENOUS | Status: AC | PRN
Start: 2022-06-12 — End: 2022-06-12
  Administered 2022-06-12: 17:00:00 5 mg via INTRAVENOUS

## 2022-06-12 MED ORDER — TECHNETIUM TC 99M SESTAMIBI IV KIT
Freq: Once | INTRAVENOUS | Status: AC | PRN
Start: 2022-06-12 — End: 2022-06-12
  Administered 2022-06-12: 14:00:00 12.6 via INTRAVENOUS

## 2022-06-13 NOTE — Other (Signed)
Your stress test does not show any significant abnormalities. We will discuss the results in detail at follow up.

## 2022-06-14 NOTE — Progress Notes (Unsigned)
Brief rapid A-fib during nuclear stress test.  He was given IV Lopressor.  Dr. Vennie Homans talked with patient.  Patient is going to use home recording device to screen for additional spells.

## 2022-07-03 LAB — NM STRESS TEST WITH MYOCARDIAL PERFUSION
Baseline Diastolic BP: 94 mmHg
Baseline HR: 98 {beats}/min
Baseline Systolic BP: 146 mmHg
Exercise Duration Seconds: 3
Exercise Duration Time: 6
Nuc Stress Diastolic Volume Index: 73 mL/m2
Nuc Stress EF: 60 %
Nuc Stress Systolic Volume Index: 29 mL/m2
Stress Diastolic BP: 100 mmHg
Stress Estimated Workload: 7 METS
Stress Peak HR: 240 {beats}/min
Stress Percent HR Achieved: 156 %
Stress Rate Pressure Product: 42240 BPM*mmHg
Stress Systolic BP: 176 mmHg
Stress Target HR: 154 {beats}/min
TID: 0.96

## 2022-07-07 NOTE — Telephone Encounter (Signed)
Patient called in regarding a code from the Dr. For a free membership for [employer].    The patient can be reached at [phone]. Thanks!

## 2022-07-08 NOTE — Telephone Encounter (Signed)
Left message for patient that we are unaware of any code to use for [employer].

## 2022-07-16 NOTE — Telephone Encounter (Signed)
Patient called in regarding questions on his appointment for Thursday. He would like to speak with a nurse or Dr. Vennie Homans.    The patient can be reached at [phone].

## 2022-07-17 ENCOUNTER — Inpatient Hospital Stay: Admit: 2022-07-17 | Payer: MEDICARE | Attending: Internal Medicine | Primary: Family Medicine

## 2022-07-17 DIAGNOSIS — I249 Acute ischemic heart disease, unspecified: Secondary | ICD-10-CM

## 2022-07-17 DIAGNOSIS — I5189 Other ill-defined heart diseases: Secondary | ICD-10-CM

## 2022-07-17 NOTE — Telephone Encounter (Signed)
Called and left a voicemail returning his call. I have been in clinic the last 2 days and have been unable to call him. If patient calls back please see what his questions are so I can further assist.

## 2022-07-17 NOTE — Telephone Encounter (Signed)
Patient called back in regarding questions for his appointment on Thursday, as he was not contacted. Patient left a second voicemail at 3:34pm on 07/16/22, after I left the office.    The patient can be reached at [phone].

## 2022-07-17 NOTE — Other (Signed)
Your Echocardiogram shows no significant abnormalities. There may be minor findings that are not clinically relevant and we can discuss this at follow up.     Kind regards,     Dr. Aarib Pulido

## 2022-07-18 LAB — ECHO (TTE) COMPLETE (PRN CONTRAST/BUBBLE/STRAIN/3D)
AV Area by Peak Velocity: 2.9 cm2
AV Area by VTI: 2.9 cm2
AV Cusp Mmode: 2 cm
AV Mean Gradient: 4 mmHg
AV Mean Velocity: 0.9 m/s
AV Peak Gradient: 7 mmHg
AV Peak Velocity: 1.3 m/s
AV VTI: 25.4 cm
AV Velocity Ratio: 0.92
AVA/BSA Peak Velocity: 1.4 cm2/m2
AVA/BSA VTI: 1.4 cm2/m2
Ao Root Index: 1.82 cm/m2
Aortic Root: 3.8 cm
Aortic Sinus Valsalva Index: 1.72 cm/m2
Aortic Sinus Valsalva: 3.6 cm
Ascending Aorta Index: 1.58 cm/m2
Ascending Aorta: 3.3 cm
Body Surface Area: 2.13 m2
E/E' Lateral: 9.33
E/E' Ratio (Averaged): 8.87
EF BP: 58 % (ref 55–100)
Est. RA Pressure: 3 mmHg
Global Longitudinal Strain: -12.4 %
IVSd: 1.1 cm — AB (ref 0.6–1.0)
LA Area 2C: 17.7 cm2
LA Area 4C: 18.5 cm2
LA Diameter: 3.1 cm
LA Major Axis: 5.5 cm
LA Minor Axis: 5.4 cm
LA Size Index: 1.48 cm/m2
LA Volume BP: 49 mL (ref 18–58)
LA Volume Index BP: 23 ml/m2 (ref 16–34)
LA Volume Index MOD A2C: 22 ml/m2 (ref 16–34)
LA Volume Index MOD A4C: 24 ml/m2 (ref 16–34)
LA Volume MOD A2C: 46 mL (ref 18–58)
LA Volume MOD A4C: 51 mL (ref 18–58)
LA/AO Root Ratio: 0.82
LV E' Lateral Velocity: 9 cm/s
LV E' Septal Velocity: 10 cm/s
LV EDV A2C: 94 mL
LV EDV A4C: 107 mL
LV EDV Index A2C: 45 mL/m2
LV EDV Index A4C: 51 mL/m2
LV ESV A2C: 40 mL
LV ESV A4C: 45 mL
LV ESV Index A2C: 19 mL/m2
LV ESV Index A4C: 22 mL/m2
LV Ejection Fraction A2C: 57 %
LV Ejection Fraction A4C: 58 %
LV Mass 2D Index: 64.4 g/m2 (ref 49–115)
LV Mass 2D: 134.7 g (ref 88–224)
LV RWT Ratio: 0.58
LVIDd Index: 1.82 cm/m2
LVIDd: 3.8 cm — AB (ref 4.2–5.9)
LVOT Area: 3.1 cm2
LVOT Diameter: 2 cm
LVOT Mean Gradient: 3 mmHg
LVOT Peak Gradient: 6 mmHg
LVOT Peak Velocity: 1.2 m/s
LVOT SV: 73.5 ml
LVOT Stroke Volume Index: 35.2 mL/m2
LVOT VTI: 23.4 cm
LVOT:AV VTI Index: 0.92
LVPWd: 1.1 cm — AB (ref 0.6–1.0)
MV A Velocity: 0.95 m/s
MV E Velocity: 0.84 m/s
MV E Wave Deceleration Time: 209 ms
MV E/A: 0.88
RA Area 4C: 17.3 cm2
RV Free Wall Peak S': 15 cm/s
RVIDd: 3.3 cm
Sinotubular Junction: 2.6 cm
TAPSE: 2.4 cm (ref 1.7–?)

## 2022-08-13 ENCOUNTER — Ambulatory Visit: Admit: 2022-08-13 | Discharge: 2022-08-13 | Payer: MEDICARE | Primary: Family Medicine

## 2022-08-13 DIAGNOSIS — I10 Essential (primary) hypertension: Secondary | ICD-10-CM

## 2022-08-13 MED ORDER — CETIRIZINE HCL 10 MG PO TABS
10 | ORAL_TABLET | Freq: Every day | ORAL | 0 refills | Status: AC
Start: 2022-08-13 — End: ?

## 2022-08-13 MED ORDER — METHYLPREDNISOLONE 4 MG PO TBPK
4 | PACK | ORAL | 0 refills | Status: AC
Start: 2022-08-13 — End: 2022-08-19

## 2022-08-13 MED ORDER — LISINOPRIL 10 MG PO TABS
10 | ORAL_TABLET | Freq: Every day | ORAL | 3 refills | Status: AC
Start: 2022-08-13 — End: ?

## 2022-08-13 NOTE — Assessment & Plan Note (Signed)
Unclear control, He has been taking claritin for a while.  I will have him try zyrtec and also a steroid pack.  Discussed increased fluids and continue flonase.  If no improvement call back.

## 2022-08-13 NOTE — Assessment & Plan Note (Signed)
Will have him try omeprazole 20mg  twice a day since he is mostly having symptoms at night and it might be r/t his GERD.

## 2022-08-13 NOTE — Assessment & Plan Note (Signed)
At goal, continue current medications

## 2022-08-13 NOTE — Progress Notes (Signed)
CHIEF COMPLAINT:  Chief Complaint   Patient presents with    Cough     Started after cutting grass on Friday    Wheezing     During  the night when laying down, No Shortness of breath         HISTORY OF PRESENT ILLNESS:  William Lloyd is a 67 y.o. male  who presents today reporting he recently cut the grass and shortly afterwards, he began coughing and having mucous production.  He took benadryl and mucinex which he reports did not really help.      He is also not sure if he is having GERD issues that may be causing the feeling of mucous logged in his esophagus that he has issues clearing sometimes.  He reports when he lays down at night he feels wheezing.      PHQ:      08/13/2022    11:30 AM   PHQ-9    Little interest or pleasure in doing things 0   Feeling down, depressed, or hopeless 0   PHQ-2 Score 0   PHQ-9 Total Score 0       CURRENT MEDICATION LIST:    Prior to Admission medications    Medication Sig Start Date End Date Taking? Authorizing Provider   lisinopril (PRINIVIL;ZESTRIL) 10 MG tablet Take 1 tablet by mouth daily 08/13/22  Yes Mandela Bello, APRN - CNP   methylPREDNISolone (MEDROL DOSEPACK) 4 MG tablet Take by mouth. 08/13/22 08/19/22 Yes Dorathy Stallone, APRN - CNP   cetirizine (ZYRTEC) 10 MG tablet Take 1 tablet by mouth daily 08/13/22  Yes Anselmo Reihl, APRN - CNP   aspirin 81 MG EC tablet Take 1 tablet by mouth daily   Yes [provider]   loratadine (CLARITIN) 10 MG tablet Take 1 tablet by mouth daily 12/19/21  Yes Olena Leatherwood, Danielle C, DO   metoprolol tartrate (LOPRESSOR) 25 MG tablet Take 1 tablet by mouth daily  Patient taking differently: Take 0.5 tablets by mouth 2 times daily 12/19/21  Yes Yuen, Danielle C, DO   atorvastatin (LIPITOR) 80 MG tablet Take 1 tablet by mouth nightly at bedtime. 12/19/21  Yes Yuen, Danielle C, DO   calcium carb-cholecalciferol 600-20 MG-MCG TABS Take 1 tablet by mouth daily   Yes [provider]   Ginkgo Biloba 40 MG TABS Take 60 mg by mouth daily    Yes [provider]   nitroGLYCERIN (NITROSTAT) 0.4 MG SL tablet DISSOLVE ONE TABLET UNDER THE TONGUE EVERY 5 MINUTES AS NEEDED FOR CHEST PAIN.  DO NOT EXCEED A TOTAL OF 3 DOSES IN 15 MINUTES 03/18/21  Yes [provider]   Multiple Vitamins-Minerals (MENS ONE DAILY PO) Take by mouth   Yes [provider]   vitamin D (CHOLECALCIFEROL) 25 MCG (1000 UT) TABS tablet Take 1 tablet by mouth daily   Yes [provider]   Garlic 1000 MG CAPS Take by mouth   Yes [provider]   zinc gluconate 50 MG tablet Take 1 tablet by mouth daily   Yes [provider]   omeprazole (PRILOSEC) 20 MG delayed release capsule Take 1 capsule by mouth daily   Yes [provider]   fluticasone (FLONASE) 50 MCG/ACT nasal spray 1 spray by Each Nostril route daily 09/26/21  Yes Wyline Copas, DO           I have reviewed and reconciled the above medication list during today's office visit.     ALLERGIES:  Allergies   Allergen Reactions    Amoxicillin Hives     Other reaction(s): Not Indicated        HISTORY:  Past Medical History:   Diagnosis Date    Hearing loss     History of heart attack     Hypertension     Kidney stone     NSTEMI (non-ST elevated myocardial infarction) (HCC) 10/20/2014    Last Assessment & Plan:  Formatting of this note might be different from the original. Coronary artery disease 10/2014  a. cath 10/23/2014 2.5 x 20 Synergy DES to the LAD, EF 40-45 percent b. relook cath 6/29, patent stents, medical management      Upper GI bleed 07/06/2015    Likely 2/2 NSAIDs, stable      Past Surgical History:   Procedure Laterality Date    EAR SURGERY Left     OTHER SURGICAL HISTORY      heart attack/ stent placement    OTHER SURGICAL HISTORY      bleeding ulcer    VASECTOMY        Family History   Problem Relation Age of Onset    Mental Illness Mother     Dementia Mother     Heart Attack Mother     Heart Attack Sister     Other Son         blood clots    No Known Problems  Daughter       Social History     Socioeconomic History    Marital status: Radiation protection practitioner     Spouse name: Not on file    Number of children: Not on file    Years of education: Not on file    Highest education level: Not on file   Occupational History    Not on file   Tobacco Use    Smoking status: Never    Smokeless tobacco: Never   Vaping Use    Vaping Use: Never used   Substance and Sexual Activity    Alcohol use: Never    Drug use: Never    Sexual activity: Yes     Partners: Female   Other Topics Concern    Not on file   Social History Narrative    Not on file     Social Determinants of Health     Financial Resource Strain: Not on file   Food Insecurity: Not on file   Transportation Needs: Not on file   Physical Activity: Sufficiently Active (04/03/2022)    Exercise Vital Sign     Days of Exercise per Week: 3 days     Minutes of Exercise per Session: 60 min   Stress: Not on file   Social Connections: Not on file   Intimate Partner Violence: Not on file   Housing Stability: Not on file        REVIEW OF SYSTEMS:  Review of Systems     PHYSICAL EXAM:    Vital Signs -   Vitals:    08/13/22 1125   BP: 126/80   Pulse: 95   SpO2: 99%   Weight: 93.2 kg (205 lb 6.4 oz)   Height: 1.753 m ( )        Physical Exam  Constitutional:       General: He is not in acute distress.     Appearance: Normal appearance. He is not ill-appearing.   HENT:      Head: Normocephalic.  Right Ear: Tympanic membrane, ear canal and external ear normal.      Left Ear: Tympanic membrane, ear canal and external ear normal.      Ears:      Comments: Bilateral ear tubes with no drainage     Nose: Mucosal edema present.      Mouth/Throat:      Mouth: Mucous membranes are moist.      Pharynx: Oropharynx is clear.   Cardiovascular:      Rate and Rhythm: Normal rate and regular rhythm.      Heart sounds: Normal heart sounds. No murmur heard.     No friction rub. No gallop.   Pulmonary:      Effort: Pulmonary effort is normal.      Breath sounds:  Normal breath sounds. No wheezing, rhonchi or rales.   Skin:     General: Skin is warm and dry.   Neurological:      General: No focal deficit present.      Mental Status: He is alert and oriented to person, place, and time.   Psychiatric:         Mood and Affect: Mood normal.         Behavior: Behavior normal.         Thought Content: Thought content normal.         Judgment: Judgment normal.                  LABS  No results found for this visit on 08/13/22.  Hospital Outpatient Visit on 07/17/2022   Component Date Value Ref Range Status    LV EDV A2C 07/17/2022 94  mL Final    LV EDV A4C 07/17/2022 107  mL Final    LV ESV A2C 07/17/2022 40  mL Final    LV ESV A4C 07/17/2022 45  mL Final    IVSd 07/17/2022 1.1 (A)  0.6 - 1.0 cm Final    LVIDd 07/17/2022 3.8 (A)  4.2 - 5.9 cm Final    LVOT Diameter 07/17/2022 2.0  cm Final    LVOT Mean Gradient 07/17/2022 3  mmHg Final    LVOT VTI 07/17/2022 23.4  cm Final    LVOT Peak Velocity 07/17/2022 1.2  m/s Final    LVOT Peak Gradient 07/17/2022 6  mmHg Final    LVPWd 07/17/2022 1.1 (A)  0.6 - 1.0 cm Final    LV E' Lateral Velocity 07/17/2022 9  cm/s Final    LV E' Septal Velocity 07/17/2022 10  cm/s Final    Global Longitudinal Strain 07/17/2022 -12.4  % Final    LV Ejection Fraction A2C 07/17/2022 57  % Final    LV Ejection Fraction A4C 07/17/2022 58  % Final    EF BP 07/17/2022 58  55 - 100 % Final    LVOT Area 07/17/2022 3.1  cm2 Final    LVOT SV 07/17/2022 73.5  ml Final    LA Minor Axis 07/17/2022 5.4  cm Final    LA Major Axis 07/17/2022 5.5  cm Final    LA Area 2C 07/17/2022 17.7  cm2 Final    LA Area 4C 07/17/2022 18.5  cm2 Final    LA Volume MOD A2C 07/17/2022 46  18 - 58 mL Final    LA Volume MOD A4C 07/17/2022 51  18 - 58 mL Final    LA Volume BP 07/17/2022 49  18 - 58 mL Final    LA Diameter 07/17/2022  3.1  cm Final    RA Area 4C 07/17/2022 17.3  cm2 Final    Est. RA Pressure 07/17/2022 3  mmHg Final    AV Cusp Mmode 07/17/2022 2.0  cm Final    AV Mean Gradient  07/17/2022 4  mmHg Final    AV VTI 07/17/2022 25.4  cm Final    AV Mean Velocity 07/17/2022 0.9  m/s Final    AV Peak Velocity 07/17/2022 1.3  m/s Final    AV Peak Gradient 07/17/2022 7  mmHg Final    AV Area by VTI 07/17/2022 2.9  cm2 Final    AV Area by Peak Velocity 07/17/2022 2.9  cm2 Final    Aortic Root 07/17/2022 3.8  cm Final    Ascending Aorta 07/17/2022 3.3  cm Final    Sinotubular Junction 07/17/2022 2.6  cm Final    Aortic Sinus Valsalva 07/17/2022 3.6  cm Final    MV E Wave Deceleration Time 07/17/2022 209.0  ms Final    MV A Velocity 07/17/2022 0.95  m/s Final    MV E Velocity 07/17/2022 0.84  m/s Final    RVIDd 07/17/2022 3.3  cm Final    RV Free Wall Peak S' 07/17/2022 15  cm/s Final    TAPSE 07/17/2022 2.4  1.7 cm Final    Body Surface Area 07/17/2022 2.13  m2 Final    LV ESV Index A4C 07/17/2022 22  mL/m2 Final    LV EDV Index A4C 07/17/2022 51  mL/m2 Final    LV ESV Index A2C 07/17/2022 19  mL/m2 Final    LV EDV Index A2C 07/17/2022 45  mL/m2 Final    LVIDd Index 07/17/2022 1.82  cm/m2 Final    LV RWT Ratio 07/17/2022 0.58   Final    LV Mass 2D 07/17/2022 134.7  88 - 224 g Final    LV Mass 2D Index 07/17/2022 64.4  49 - 115 g/m2 Final    MV E/A 07/17/2022 0.88   Final    E/E' Ratio (Averaged) 07/17/2022 8.87   Final    E/E' Lateral 07/17/2022 9.33   Final    LA Volume Index BP 07/17/2022 23  16 - 34 ml/m2 Final    LVOT Stroke Volume Index 07/17/2022 35.2  mL/m2 Final    LA Volume Index MOD A2C 07/17/2022 22  16 - 34 ml/m2 Final    LA Volume Index MOD A4C 07/17/2022 24  16 - 34 ml/m2 Final    LA Size Index 07/17/2022 1.48  cm/m2 Final    LA/AO Root Ratio 07/17/2022 0.82   Final    Ao Root Index 07/17/2022 1.82  cm/m2 Final    Aortic Sinus Valsalva Index 07/17/2022 1.72  cm/m2 Final    Ascending Aorta Index 07/17/2022 1.58  cm/m2 Final    AV Velocity Ratio 07/17/2022 0.92   Final    LVOT:AV VTI Index 07/17/2022 0.92   Final    AVA/BSA VTI 07/17/2022 1.4  cm2/m2 Final    AVA/BSA Peak Velocity  07/17/2022 1.4  cm2/m2 Final       I have reviewed the above labs with the patient during today's office visit.    IMPRESSION/PLAN    1. Essential hypertension, benign  Overview:  Last Assessment & Plan:   Formatting of this note might be different from the original.  Pressure slightly elevated today.  Plan to recheck normally well controlled.  He said he had a very stressful day at work today and they have  been working overtime.  Assessment & Plan:   At goal, continue current medications  Orders:  -     lisinopril (PRINIVIL;ZESTRIL) 10 MG tablet; Take 1 tablet by mouth daily, Disp-90 tablet, R-3Normal  2. Allergic rhinitis, unspecified seasonality, unspecified trigger  Assessment & Plan:   Unclear control, He has been taking claritin for a while.  I will have him try zyrtec and also a steroid pack.  Discussed increased fluids and continue flonase.  If no improvement call back.     3. Gastroesophageal reflux disease without esophagitis  Assessment & Plan:    Will have him try omeprazole 20mg  twice a day since he is mostly having symptoms at night and it might be r/t his GERD.           Follow up and Dispositions:  No follow-ups on file.

## 2022-08-18 NOTE — Telephone Encounter (Signed)
ON 08-18-22 @ 344 PM LMOM TO INFORM PT THAT 08-29-22 APPT WITH [facility] IS CX AND TO CALL THE OFFICE BACK TO R/S.

## 2022-08-20 ENCOUNTER — Ambulatory Visit: Admit: 2022-08-20 | Discharge: 2022-08-20 | Payer: MEDICARE | Attending: Physician Assistant | Primary: Family Medicine

## 2022-08-20 DIAGNOSIS — I2510 Atherosclerotic heart disease of native coronary artery without angina pectoris: Secondary | ICD-10-CM

## 2022-08-20 NOTE — Progress Notes (Signed)
Date:  August 20, 2022  Patient name: William Lloyd  Date of Birth: [birth year]      REASON FOR CLINIC VISIT: Follow-up      PRIMARY CARDIOLOGIST: Dr. Claris Gladden    HISTORY OF PRESENT ILLNESS          Axl Rodino was seen in cardiology clinic today.     Patient is a 67 y.o. male with history of coronary artery disease status post MI in 2016, NSTEMI with DES to the mid LAD, mild ischemic cardiomyopathy EF of 40 to 45% post MI.  He recently moved from [facility] and establish care with Dr. Claris Gladden.  He had an updated stress test and echo.  Echo showed his EF has normalized.  Stress test showed no ischemia, but he went into rapid A-fib at peak exercise requiring IV Lopressor.  He says he did feel palpitations and was symptomatic with it.  He has never felt anything like this before.  He attributes it to the belt being too tight around his chest and says he could not catch his breath.      PAST HISTORY          Past Medical History:   has a past medical history of Hearing loss, History of heart attack, Hypertension, Kidney stone, NSTEMI (non-ST elevated myocardial infarction) (HCC), and Upper GI bleed.    Past Surgical History:   has a past surgical history that includes Vasectomy; Ear surgery (Left); other surgical history; and other surgical history.     Social History:   reports that he has never smoked. He has never used smokeless tobacco. He reports that he does not drink alcohol and does not use drugs.     Family History: family history includes Dementia in his mother; Heart Attack in his mother and sister; Mental Illness in his mother; No Known Problems in his daughter; Other in his son.    Allergies:  Amoxicillin    Home Medications:    Prior to Admission medications    Medication Sig Start Date End Date Taking? Authorizing Provider   lisinopril (PRINIVIL;ZESTRIL) 10 MG tablet Take 1 tablet by mouth daily 08/13/22  Yes Ballares, Debra, APRN - CNP   cetirizine (ZYRTEC) 10 MG tablet Take 1 tablet by mouth daily  08/13/22  Yes Ballares, Debra, APRN - CNP   aspirin 81 MG EC tablet Take 1 tablet by mouth daily   Yes [provider]   loratadine (CLARITIN) 10 MG tablet Take 1 tablet by mouth daily 12/19/21  Yes Olena Leatherwood, Danielle C, DO   metoprolol tartrate (LOPRESSOR) 25 MG tablet Take 1 tablet by mouth daily  Patient taking differently: Take 0.5 tablets by mouth 2 times daily 12/19/21  Yes Yuen, Danielle C, DO   atorvastatin (LIPITOR) 80 MG tablet Take 1 tablet by mouth nightly at bedtime. 12/19/21  Yes Yuen, Danielle C, DO   calcium carb-cholecalciferol 600-20 MG-MCG TABS Take 1 tablet by mouth daily   Yes [provider]   Ginkgo Biloba 40 MG TABS Take 60 mg by mouth daily   Yes [provider]   nitroGLYCERIN (NITROSTAT) 0.4 MG SL tablet DISSOLVE ONE TABLET UNDER THE TONGUE EVERY 5 MINUTES AS NEEDED FOR CHEST PAIN.  DO NOT EXCEED A TOTAL OF 3 DOSES IN 15 MINUTES 03/18/21  Yes [provider]   Multiple Vitamins-Minerals (MENS ONE DAILY PO) Take by mouth   Yes [provider]   vitamin D (CHOLECALCIFEROL) 25 MCG (1000 UT) TABS tablet Take 1  tablet by mouth daily   Yes [provider]   Garlic 1000 MG CAPS Take by mouth   Yes [provider]   zinc gluconate 50 MG tablet Take 1 tablet by mouth daily   Yes [provider]   omeprazole (PRILOSEC) 20 MG delayed release capsule Take 1 capsule by mouth daily   Yes [provider]   fluticasone (FLONASE) 50 MCG/ACT nasal spray 1 spray by Each Nostril route daily 09/26/21  Yes Ivar Bury C, DO         REVIEW OF SYSTEMS          Review of Systems   Constitutional:  Negative for activity change, fatigue, fever and unexpected weight change.   HENT:  Negative for nosebleeds.    Eyes:  Negative for visual disturbance.   Respiratory:  Positive for cough and wheezing. Negative for chest tightness.    Cardiovascular:  Negative for chest pain, palpitations and leg swelling.   Gastrointestinal:  Negative for  abdominal pain, constipation, diarrhea and nausea.   Endocrine: Negative for polydipsia and polyuria.   Genitourinary:  Negative for flank pain.   Musculoskeletal:  Negative for joint swelling and myalgias.   Skin:  Negative for rash.   Neurological:  Negative for dizziness, syncope, weakness, light-headedness, numbness and headaches.   Psychiatric/Behavioral:  The patient is not nervous/anxious.        PHYSICAL EXAM            BP 132/74   Pulse 85   Resp 16   Wt 93 kg (205 lb)   SpO2 93%   BMI 30.27 kg/m      CONSTITUTIONAL:  awake, alert, and oriented, no apparent distress  LUNGS:  No increased work of breathing, good air exchange, clear to auscultation bilaterally, no crackles or wheezing  CARDIOVASCULAR:  Normal apical impulse, regular rate and rhythm, normal S1 and S2, no S3 or S4, and no murmur noted, no edema  ABDOMEN:  Soft, non-tender, non-distended   EXTREMITIES:  there is no redness, warmth, or swelling of the joints; full range of motion noted      DIAGNOSTIC STUDIES          Echocardiogram:  07/17/22    ECHO (TTE) COMPLETE (PRN CONTRAST/BUBBLE/STRAIN/3D) 07/17/2022  9:34 PM (Final)    Interpretation Summary    Left Ventricle: Normal left ventricular systolic function with a visually estimated EF of 55 - 60%. EF by 2D Simpsons Biplane is 58%. Left ventricle size is normal. Mildly increased wall thickness. Unable to assess wall motion. Global longitudinal strain is reduced with a value of -12.4%. Normal diastolic function.    Signed by: Megan Mans, MD on 07/17/2022  9:34 PM      Stress test:  06/12/22    NM STRESS TEST WITH MYOCARDIAL PERFUSION 06/13/2022  5:14 PM (Final)    Interpretation Summary    Stress Combined Conclusion: The study is negative for exercise induced myocardial ischemia.    Fixed apical inferior defect is artifact.    Post-stress LVEF 60%    7 METs    Atrial fibrillation with RVR at peak stress and into recovery which resolved with 5 mg iv metoprolol.    Pt advised to monitor  heart rhythm via home Kardia monitor.  Addendum by: Megan Mans, MD on 07/03/2022  4:57 AM    Signed by: Megan Mans, MD on 06/13/2022  5:14 PM         LABS  Lab Results   Component Value Date    CHOL 110 04/03/2022     Lab Results   Component Value Date    TRIG 92 04/03/2022     Lab Results   Component Value Date    HDL 55 04/03/2022     Lab Results   Component Value Date    LDLCHOLESTEROL 36.6 04/03/2022     Lab Results   Component Value Date    VLDL 18.4 04/03/2022     Lab Results   Component Value Date    CHOLHDLRATIO 2.0 04/03/2022          ASSESSMENT / RECOMMENDATIONS        1. Coronary artery disease involving native coronary artery of native heart without angina pectoris  Stable with no evidence of angina. Continue aspirin and statin. He will call if he has any episodes of palpitations.       Darrel Hoover, [facility] / [facility]

## 2022-08-29 ENCOUNTER — Encounter: Attending: Internal Medicine | Primary: Family Medicine

## 2022-09-30 MED ORDER — TAMSULOSIN HCL 0.4 MG PO CAPS
0.4 MG | ORAL_CAPSULE | Freq: Every day | ORAL | 0 refills | Status: AC
Start: 2022-09-30 — End: 2022-12-29

## 2022-09-30 NOTE — Telephone Encounter (Signed)
Patient calling back with pharmacy location to provide to [facility]  [phone]. William Lloyd.

## 2022-09-30 NOTE — Telephone Encounter (Signed)
From: William Lloyd  To: Dr. Deberah Castle  Sent: 09/30/2022 9:05 AM EDT  Subject: Tamsulosin 0.4 MG CAP AUR    Good morning Dr. Corlis Leak,  I would like to request a refill on the above mentioned prescribed medication. I did not see it in My Chart. I have been taking this since May 2023 prescribed by Dr. Ivar Bury. I take one capsule once a day and I received 90 day prescription.   Thank you for your assistance.   William Lloyd  [phone]

## 2022-09-30 NOTE — Telephone Encounter (Signed)
Up to date on visits.    Dr Samie / Debbie,NP to advise

## 2022-10-07 ENCOUNTER — Encounter: Attending: Family Medicine | Primary: Family Medicine

## 2022-11-03 ENCOUNTER — Ambulatory Visit: Admit: 2022-11-03 | Discharge: 2022-11-03 | Payer: MEDICARE | Attending: Family Medicine | Primary: Family Medicine

## 2022-11-03 ENCOUNTER — Encounter

## 2022-11-03 VITALS — BP 128/70 | HR 71 | Ht 69.0 in | Wt 196.4 lb

## 2022-11-03 DIAGNOSIS — Z0000 Encounter for general adult medical examination without abnormal findings: Principal | ICD-10-CM

## 2022-11-03 LAB — PSA SCREENING: PSA, Screening: 3.91 ng/mL (ref 0.000–4.000)

## 2022-11-03 LAB — CBC WITH AUTO DIFFERENTIAL
Basophils %: 0.8 % (ref 0.0–2.0)
Basophils Absolute: 0.1 10*3/uL (ref 0.0–0.2)
Eosinophils %: 1.3 % (ref 0.0–7.0)
Eosinophils Absolute: 0.1 10*3/uL (ref 0.0–0.5)
Hematocrit: 42.1 % (ref 38.0–52.0)
Hemoglobin: 14.4 g/dL (ref 13.0–17.3)
Immature Grans (Abs): 0.03 10*3/uL (ref 0.00–0.06)
Immature Granulocytes %: 0.5 % (ref 0.0–0.6)
Lymphocytes Absolute: 1.2 10*3/uL (ref 1.0–3.2)
Lymphocytes: 19.1 % (ref 15.0–45.0)
MCH: 31.1 pg (ref 27.0–34.5)
MCHC: 34.2 g/dL (ref 32.0–36.0)
MCV: 90.9 fL (ref 84.0–100.0)
MPV: 10.2 fL (ref 7.2–13.2)
Monocytes %: 9.9 % (ref 4.0–12.0)
Monocytes Absolute: 0.6 10*3/uL (ref 0.3–1.0)
NRBC Absolute: 0 10*3/uL (ref 0.000–0.012)
NRBC Automated: 0 % (ref 0.0–0.2)
Neutrophils %: 68.4 % (ref 42.0–74.0)
Neutrophils Absolute: 4.1 10*3/uL (ref 1.6–7.3)
Platelets: 201 10*3/uL (ref 140–440)
RBC: 4.63 x10e6/mcL (ref 4.00–5.60)
RDW: 13.9 % (ref 11.0–16.0)
WBC: 6.1 10*3/uL (ref 3.8–10.6)

## 2022-11-03 LAB — LIPID PANEL
Chol/HDL Ratio: 2.2 (ref 0.0–4.4)
Cholesterol, Total: 113 mg/dL (ref 100–200)
HDL: 52 mg/dL (ref >=40)
LDL Cholesterol: 45.8 mg/dL (ref 0.0–100.0)
LDL/HDL Ratio: 0.9
Triglycerides: 76 mg/dL (ref 0–149)
VLDL: 15.2 mg/dL (ref 5.0–40.0)

## 2022-11-03 LAB — COMPREHENSIVE METABOLIC PANEL
ALT: 34 U/L (ref 0–50)
AST: 31 U/L (ref 0–50)
Albumin/Globulin Ratio: 1.9 (ref 1.00–2.70)
Albumin: 4.4 g/dL (ref 3.5–5.2)
Alk Phosphatase: 100 U/L (ref 40–130)
Anion Gap: 11 mmol/L (ref 2–17)
BUN: 18 mg/dL (ref 8–23)
CO2: 23 mmol/L (ref 22–29)
Calcium: 9.8 mg/dL (ref 8.5–10.7)
Chloride: 103 mmol/L (ref 98–107)
Creatinine: 1.3 mg/dL (ref 0.7–1.3)
Est, Glom Filt Rate: 60 mL/min/1.73m (ref >=60)
Globulin: 2.3 g/dL (ref 1.9–4.4)
Glucose: 108 mg/dL — ABNORMAL HIGH (ref 70–99)
Osmolaliy Calculated: 276 mOsm/kg (ref 270–287)
Potassium: 4.7 mmol/L (ref 3.5–5.3)
Sodium: 137 mmol/L (ref 135–145)
Total Bilirubin: 0.65 mg/dL (ref 0.00–1.20)
Total Protein: 6.7 g/dL (ref 5.7–8.3)

## 2022-11-03 LAB — HEMOGLOBIN A1C
Estimated Avg Glucose: 117
Estimated Avg Glucose: 126
Hemoglobin A1C: 5.7 % (ref 4.0–6.0)

## 2022-11-03 LAB — TSH WITH REFLEX: TSH: 1.78 mcIU/mL (ref 0.358–3.740)

## 2022-11-03 MED ORDER — LORATADINE 10 MG PO TABS
10 MG | ORAL_TABLET | Freq: Every day | ORAL | 1 refills | Status: AC
Start: 2022-11-03 — End: ?

## 2022-11-03 NOTE — Progress Notes (Signed)
Medicare Annual Wellness Visit    William Lloyd is here for Medicare AWV (No refills at this time- VRT-6/27 KBS)    Assessment & Plan   Medicare annual wellness visit, subsequent  Comments:  will update blood work  Bilateral hearing loss, unspecified hearing loss type  -     loratadine (CLARITIN) 10 MG tablet; Take 1 tablet by mouth daily, Disp-90 tablet, R-1Normal  Essential hypertension, benign  Comments:  stable  bp is 128/70  continue with lisnopril  and BB  Orders:  -     CBC with Auto Differential; Future  -     Comprehensive Metabolic Panel; Future  NSTEMI (non-ST elevated myocardial infarction) (HCC)  Comments:  recent cardiac eval was stable  discussed with pt  Gastroesophageal reflux disease without esophagitis  Comments:  stable  Stage 3a chronic kidney disease (HCC)  Comments:  avoid nephrotoxins  Hyperlipidemia, unspecified hyperlipidemia type  Comments:  on lipitor  well tolerated  no myopathy  Orders:  -     Lipid Panel; Future  Overweight (BMI 25.0-29.9)  Screening for prostate cancer  -     PSA Screening; Future  Elevated blood sugar  -     Hemoglobin A1C; Future  Change in weight  -     TSH with Reflex; Future    Recommendations for Preventive Services Due: see orders and patient instructions/AVS.  Recommended screening schedule for the next 5-10 years is provided to the patient in written form: see Patient Instructions/AVS.     Return in about 6 months (around 05/06/2023).     Subjective   The following acute and/or chronic problems were also addressed today:  67  -year-old male who is here today for his annual visit  He has been consistent with taking his medication request refill for the allergy medicine that he takes as needed  He wanted to make sure that he is up-to-date with his screening measures  Denies of any other new complaints  Patient's complete Health Risk Assessment and screening values have been reviewed and are found in Flowsheets. The following problems were reviewed today and  where indicated follow up appointments were made and/or referrals ordered.    Positive Risk Factor Screenings with Interventions:                  Hearing Screen:  Do you or your family notice any trouble with your hearing that hasn't been managed with hearing aids?: (!) Yes    Interventions:  Referred to ENT         Advanced Directives:  Do you have a Living Will?: (!) No    Intervention:            Objective   Vitals:    11/03/22 0816   BP: 128/70   Pulse: 71   SpO2: 97%   Weight: 89.1 kg (196 lb 6.4 oz)   Height: 1.753 m (5\' 9" )      Body mass index is 29 kg/m.        General Appearance: alert and oriented to person, place and time, well developed and well- nourished, in no acute distress  Skin: warm and dry, no rash or erythema  Head: normocephalic and atraumatic  Eyes: pupils equal, round, and reactive to light, extraocular eye movements intact, conjunctivae normal  ENT: tympanic membrane, external ear and ear canal normal bilaterally, nose without deformity, nasal mucosa and turbinates normal without polyps  Neck: supple and non-tender without mass, no thyromegaly or thyroid  nodules, no cervical lymphadenopathy  Pulmonary/Chest: clear to auscultation bilaterally- no wheezes, rales or rhonchi, normal air movement, no respiratory distress  Cardiovascular: normal rate, regular rhythm, normal S1 and S2, no murmurs, rubs, clicks, or gallops, distal pulses intact, no carotid bruits  Abdomen: soft, non-tender, non-distended, normal bowel sounds, no masses or organomegaly  Extremities: no cyanosis, clubbing or edema  Musculoskeletal: normal range of motion, no joint swelling, deformity or tenderness  Neurologic: reflexes normal and symmetric, no cranial nerve deficit, gait, coordination and speech normal       Allergies   Allergen Reactions    Amoxicillin Hives     Other reaction(s): Not Indicated     Prior to Visit Medications    Medication Sig Taking? Authorizing Provider   loratadine (CLARITIN) 10 MG tablet Take 1  tablet by mouth daily Yes Benjiman Core, Makenley Shimp, MD   dutasteride-tamsulosin 0.5-0.4 MG CAPS Take 1 capsule by mouth daily Yes [provider]   tamsulosin (FLOMAX) 0.4 MG capsule Take 1 capsule by mouth daily Yes Ballares, Debra, APRN - CNP   lisinopril (PRINIVIL;ZESTRIL) 10 MG tablet Take 1 tablet by mouth daily Yes Ballares, Debra, APRN - CNP   cetirizine (ZYRTEC) 10 MG tablet Take 1 tablet by mouth daily Yes Ballares, Debra, APRN - CNP   aspirin 81 MG EC tablet Take 1 tablet by mouth daily Yes [provider]   metoprolol tartrate (LOPRESSOR) 25 MG tablet Take 1 tablet by mouth daily  Patient taking differently: Take 0.5 tablets by mouth 2 times daily Yes Olena Leatherwood, Danielle C, DO   atorvastatin (LIPITOR) 80 MG tablet Take 1 tablet by mouth nightly at bedtime. Yes Ivar Bury C, DO   calcium carb-cholecalciferol 600-20 MG-MCG TABS Take 1 tablet by mouth daily Yes [provider]   Ginkgo Biloba 40 MG TABS Take 60 mg by mouth daily Yes [provider]   nitroGLYCERIN (NITROSTAT) 0.4 MG SL tablet DISSOLVE ONE TABLET UNDER THE TONGUE EVERY 5 MINUTES AS NEEDED FOR CHEST PAIN.  DO NOT EXCEED A TOTAL OF 3 DOSES IN 15 MINUTES Yes [provider]   Multiple Vitamins-Minerals (MENS ONE DAILY PO) Take by mouth Yes [provider]   vitamin D (CHOLECALCIFEROL) 25 MCG (1000 UT) TABS tablet Take 1 tablet by mouth daily Yes [provider]   Garlic 1000 MG CAPS Take by mouth Yes [provider]   zinc gluconate 50 MG tablet Take 1 tablet by mouth daily Yes [provider]   omeprazole (PRILOSEC) 20 MG delayed release capsule Take 1 capsule by mouth daily Yes [provider]       CareTeam (Including outside providers/suppliers regularly involved in providing care):   Patient Care Team:  Deberah Castle, MD as PCP - General (Family Medicine)  Benjiman Core, Janae Sauce, MD as PCP - Empaneled Provider  Megan Mans, MD as Cardiologist  (Cardiology)     Reviewed and updated this visit:  Tobacco  Allergies  Meds  Med Hx  Surg Hx  Soc Hx  Fam Hx

## 2022-11-03 NOTE — Patient Instructions (Signed)
Hearing Loss: Care Instructions  Overview     Hearing loss is a sudden or slow decrease in how well you hear. It can range from slight to profound. Permanent hearing loss can occur with aging. It also can happen when you are exposed long-term to loud noise. Examples include listening to loud music, riding motorcycles, or being around other loud machines.  Hearing loss can affect your work and home life. It can make you feel lonely or depressed. You may feel that you have lost your independence. But hearing aids and other devices can help you hear better and feel connected to others.  Follow-up care is a key part of your treatment and safety. Be sure to make and go to all appointments, and call your doctor if you are having problems. It's also a good idea to know your test results and keep a list of the medicines you take.  How can you care for yourself at home?  Avoid loud noises whenever possible. This helps keep your hearing from getting worse.  Always wear hearing protection around loud noises.  Wear a hearing aid as directed.  A professional can help you pick a hearing aid that will work best for you.  You can also get hearing aids over the counter for mild to moderate hearing loss.  Have hearing tests as your doctor suggests. They can show whether your hearing has changed. Your hearing aid may need to be adjusted.  Use other devices as needed. These may include:  Telephone amplifiers and hearing aids that can connect to a television, stereo, radio, or microphone.  Devices that use lights or vibrations. These alert you to the doorbell, a ringing telephone, or a baby monitor.  Television closed-captioning. This shows the words at the bottom of the screen. Most new TVs can do this.  TTY (text telephone). This lets you type messages back and forth on the telephone instead of talking or listening. These devices are also called TDD. When messages are typed on the keyboard, they are sent over the phone line to a  receiving TTY. The message is shown on a monitor.  Use text messaging, social media, and email if it is hard for you to communicate by telephone.  Try to learn a listening technique called speechreading. It is not lipreading. You pay attention to people's gestures, expressions, posture, and tone of voice. These clues can help you understand what a person is saying. Face the person you are talking to, and have them face you. Make sure the lighting is good. You need to see the other person's face clearly.  Think about counseling if you need help to adjust to your hearing loss.  When should you call for help?  Watch closely for changes in your health, and be sure to contact your doctor if:   You think your hearing is getting worse.    You have new symptoms, such as dizziness or nausea.   Where can you learn more?  Go to [employer] and enter R798 to learn more about "Hearing Loss: Care Instructions."  Current as of: January 29, 2022  Content Version: 14.1   2006-2024 [employer].   Care instructions adapted under license by [facility]. If you have questions about a medical condition or this instruction, always ask your healthcare professional. [employer] Inc[employer] disclaims any warranty or liability for your use of this information.           Advance Directives: Care Instructions  Overview  An advance  directive is a legal way to state your wishes at the end of your life. It tells your family and your doctor what to do if you can't say what you want.  There are two main types of advance directives. You can change them any time your wishes change.  Living will.  This form tells your family and your doctor your wishes about life support and other treatment. The form is also called a declaration.  Medical power of attorney.  This form lets you name a person to make treatment decisions for you when you can't speak for yourself. This person is called a health care agent  (health care proxy, health care surrogate). The form is also called a durable power of attorney for health care.  If you do not have an advance directive, decisions about your medical care may be made by a family member, or by a doctor or a judge who doesn't know you.  It may help to think of an advance directive as a gift to the people who care for you. If you have one, they won't have to make tough decisions by themselves.  For more information, including forms for your state, see the [employer] website ([employer]).  Follow-up care is a key part of your treatment and safety. Be sure to make and go to all appointments, and call your doctor if you are having problems. It's also a good idea to know your test results and keep a list of the medicines you take.  What should you include in an advance directive?  Many states have a unique advance directive form. (It may ask you to address specific issues.) Or you might use a universal form that's approved by many states.  If your form doesn't tell you what to address, it may be hard to know what to include in your advance directive. Use the questions below to help you get started.  Who do you want to make decisions about your medical care if you are not able to?  What life-support measures do you want if you have a serious illness that gets worse over time or can't be cured?  What are you most afraid of that might happen? (Maybe you're afraid of having pain, losing your independence, or being kept alive by machines.)  Where would you prefer to die? (Your home? A hospital? A nursing home?)  Do you want to donate your organs when you die?  Do you want certain religious practices performed before you die?  When should you call for help?  Be sure to contact your doctor if you have any questions.  Where can you learn more?  Go to [employer] and enter R264 to learn more about "Advance Directives: Care  Instructions."  Current as of: March 20, 2022  Content Version: 14.1   2006-2024 [employer], [employer].   Care instructions adapted under license by [facility]. If you have questions about a medical condition or this instruction, always ask your healthcare professional. [employer], Inc[employer] disclaims any warranty or liability for your use of this information.           A Healthy Heart: Care Instructions  Overview     Coronary artery disease, also called heart disease, occurs when a substance called plaque builds up in the vessels that supply oxygen-rich blood to your heart muscle. This can narrow the blood vessels and reduce blood flow. A heart attack happens when blood flow is completely blocked. A high-fat diet, smoking, and other factors increase  the risk of heart disease.  Your doctor has found that you have a chance of having heart disease. A heart-healthy lifestyle can help keep your heart healthy and prevent heart disease. This lifestyle includes eating healthy, being active, staying at a weight that's healthy for you, and not smoking or using tobacco. It also includes taking medicines as directed, managing other health conditions, and trying to get a healthy amount of sleep.  Follow-up care is a key part of your treatment and safety. Be sure to make and go to all appointments, and call your doctor if you are having problems. It's also a good idea to know your test results and keep a list of the medicines you take.  How can you care for yourself at home?  Diet   Use less salt when you cook and eat. This helps lower your blood pressure. Taste food before salting. Add only a little salt when you think you need it. With time, your taste buds will adjust to less salt.    Eat fewer snack items, fast foods, canned soups, and other high-salt, high-fat, processed foods.    Read food labels and try to avoid saturated and trans fats. They increase your risk of heart disease by raising cholesterol  levels.    Limit the amount of solid fat--butter, margarine, and shortening--you eat. Use olive, peanut, or canola oil when you cook. Bake, broil, and steam foods instead of frying them.    Eat a variety of fruit and vegetables every day. Dark green, deep orange, red, or yellow fruits and vegetables are especially good for you. Examples include spinach, carrots, peaches, and berries.    Foods high in fiber can reduce your cholesterol and provide important vitamins and minerals. High-fiber foods include whole-grain cereals and breads, oatmeal, beans, brown rice, citrus fruits, and apples.    Eat lean proteins. Heart-healthy proteins include seafood, lean meats and poultry, eggs, beans, peas, nuts, seeds, and soy products.    Limit drinks and foods with added sugar. These include candy, desserts, and soda pop.   Heart-healthy lifestyle   If your doctor recommends it, get more exercise. For many people, walking is a good choice. Or you may want to swim, bike, or do other activities. Bit by bit, increase the time you're active every day. Try for at least 30 minutes on most days of the week.    Try to quit or cut back on using tobacco and other nicotine products. This includes smoking and vaping. If you need help quitting, talk to your doctor about stop-smoking programs and medicines. These can increase your chances of quitting for good. Quitting is one of the most important things you can do to protect your heart. It is never too late to quit. Try to avoid secondhand smoke too.    Stay at a weight that's healthy for you. Talk to your doctor if you need help losing weight.    Try to get 7 to 9 hours of sleep each night.    Limit alcohol to 2 drinks a day for men and 1 drink a day for women. Too much alcohol can cause health problems.    Manage other health problems such as diabetes, high blood pressure, and high cholesterol. If you think you may have a problem with alcohol or drug use, talk to your doctor.    Medicines   Take your medicines exactly as prescribed. Call your doctor if you think you are having a problem with your medicine.  If your doctor recommends aspirin, take the amount directed each day. Make sure you take aspirin and not another kind of pain reliever, such as acetaminophen (Tylenol).   When should you call for help?   Call 911 if you have symptoms of a heart attack. These may include:   Chest pain or pressure, or a strange feeling in the chest.    Sweating.    Shortness of breath.    Pain, pressure, or a strange feeling in the back, neck, jaw, or upper belly or in one or both shoulders or arms.    Lightheadedness or sudden weakness.    A fast or irregular heartbeat.   After you call 911, the operator may tell you to chew 1 adult-strength or 2 to 4 low-dose aspirin. Wait for an ambulance. Do not try to drive yourself.  Watch closely for changes in your health, and be sure to contact your doctor if you have any problems.  Where can you learn more?  Go to [employer] and enter F075 to learn more about "A Healthy Heart: Care Instructions."  Current as of: October 26, 2021  Content Version: 14.1   2006-2024 [employer], Inc[employer].   Care instructions adapted under license by [facility]. If you have questions about a medical condition or this instruction, always ask your healthcare professional. Healthwise, Incorporated disclaims any warranty or liability for your use of this information.      Personalized Preventive Plan for William Lloyd - 11/03/2022  Medicare offers a range of preventive health benefits. Some of the tests and screenings are paid in full while other may be subject to a deductible, co-insurance, and/or copay.    Some of these benefits include a comprehensive review of your medical history including lifestyle, illnesses that may run in your family, and various assessments and screenings as appropriate.    After reviewing your medical record and  screening and assessments performed today your provider may have ordered immunizations, labs, imaging, and/or referrals for you.  A list of these orders (if applicable) as well as your Preventive Care list are included within your After Visit Summary for your review.    Other Preventive Recommendations:    A preventive eye exam performed by an eye specialist is recommended every 1-2 years to screen for glaucoma; cataracts, macular degeneration, and other eye disorders.  A preventive dental visit is recommended every 6 months.  Try to get at least 150 minutes of exercise per week or 10,000 steps per day on a pedometer .  Order or download the FREE "Exercise & Physical Activity: Your Everyday Guide" from [employer]. Call 805-514-6380 or search [employer] online.  You need 1200-1500 mg of calcium and 1000-2000 IU of vitamin D per day. It is possible to meet your calcium requirement with diet alone, but a vitamin D supplement is usually necessary to meet this goal.  When exposed to the sun, use a sunscreen that protects against both UVA and UVB radiation with an SPF of 30 or greater. Reapply every 2 to 3 hours or after sweating, drying off with a towel, or swimming.  Always wear a seat belt when traveling in a car. Always wear a helmet when riding a bicycle or motorcycle.

## 2022-11-03 NOTE — Progress Notes (Signed)
No complaints- here for AWV

## 2022-12-29 ENCOUNTER — Encounter

## 2022-12-29 MED ORDER — ATORVASTATIN CALCIUM 80 MG PO TABS
80 | ORAL_TABLET | Freq: Every evening | ORAL | 3 refills | 30.00000 days | Status: DC
Start: 2022-12-29 — End: 2024-02-05

## 2022-12-29 MED ORDER — TAMSULOSIN HCL 0.4 MG PO CAPS
0.4 | ORAL_CAPSULE | Freq: Every day | ORAL | 3 refills | Status: AC
Start: 2022-12-29 — End: ?

## 2022-12-29 NOTE — Telephone Encounter (Signed)
 LOV:11/03/22  NOV:05/11/23  Please advise

## 2023-05-11 ENCOUNTER — Encounter: Admit: 2023-05-11 | Payer: MEDICARE | Admitting: Family Medicine | Primary: Family Medicine

## 2023-05-11 VITALS — BP 142/92 | HR 84 | Ht 69.0 in | Wt 206.4 lb

## 2023-05-11 DIAGNOSIS — E785 Hyperlipidemia, unspecified: Principal | ICD-10-CM

## 2023-05-11 MED ORDER — NITROGLYCERIN 0.4 MG SL SUBL
0.4 | ORAL_TABLET | Freq: Every day | SUBLINGUAL | 1 refills | Status: AC | PRN
Start: 2023-05-11 — End: 2023-12-07

## 2023-05-11 NOTE — Progress Notes (Signed)
 ASSESSMENT/PLAN:     1. Hyperlipidemia, unspecified hyperlipidemia type  Comments:  continue with lipitor  Orders:  -     Lipid Panel; Future  -     Comprehensive Metabolic Panel; Future  2. Needs flu shot  -     Influenza, FLUZONE HIGH DOSE Trivalent, (age 68 y+), IM, Preservative Free, 0.5mL  3. Benign prostatic hyperplasia (BPH) with straining on urination  Comments:  repeat labs  Orders:  -     PSA Screening; Future  4. Bilateral hearing loss, unspecified hearing loss type  Comments:  following up with ENt  5. NSTEMI (non-ST elevated myocardial infarction) (HCC)  Comments:  no recurrence  follow up with cards  Orders:  -     nitroGLYCERIN  (NITROSTAT ) 0.4 MG SL tablet; Place 1 tablet under the tongue daily as needed for Chest pain up to max of 3 total doses. If no relief after 1 dose, call 911., Disp-30 tablet, R-1Normal  -     CBC with Auto Differential; Future  6. Pre-diabetes  Comments:  continue with diet and lifestyle changes  Orders:  -     Hemoglobin A1C; Future    Orders Placed This Encounter   Procedures    Influenza, FLUZONE HIGH DOSE Trivalent, (age 47 y+), IM, Preservative Free, 0.5mL    CBC with Auto Differential     Standing Status:   Future     Standing Expiration Date:   11/07/2024    Lipid Panel     Standing Status:   Future     Standing Expiration Date:   11/07/2024     Order Specific Question:   Is Patient Fasting?/# of Hours     Answer:   8    Hemoglobin A1C     Standing Status:   Future     Standing Expiration Date:   11/07/2024    Comprehensive Metabolic Panel     Standing Status:   Future     Standing Expiration Date:   11/07/2024    PSA Screening     Standing Status:   Future     Standing Expiration Date:   05/10/2024       No results found.  There are no Patient Instructions on file for this visit.   Return in about 6 months (around 11/08/2023).          SUBJECTIVE/OBJECTIVE:  William Lloyd (DOB:  1956-03-06) is a 69 y.o. male, here for evaluation of the following chief complaint(s):  Follow-up  (No concerns or questions)      68 year old male who is here today for his routine follow-up visit  He requests refill on his medication  He wanted to update his flu vaccine  He denies having any chest pain or shortness of breath he is not sure when he is scheduled to follow-up with his cardiologist  He has been consistent with taking his medication the blood pressure is slightly elevated but he denies having any headache, vision changes, weakness or numbness or any other new complaints      Review of Systems   Constitutional:  Negative for appetite change, chills, fatigue and fever.   HENT:  Negative for congestion, hearing loss and sore throat.    Eyes:  Negative for photophobia.   Respiratory:  Negative for cough, shortness of breath and wheezing.    Cardiovascular:  Negative for chest pain, palpitations and leg swelling.   Gastrointestinal:  Negative for abdominal pain.   Endocrine: Negative for polydipsia, polyphagia and  polyuria.   Skin:  Negative for rash.   Neurological:  Negative for syncope, weakness and light-headedness.   Psychiatric/Behavioral:  The patient is not nervous/anxious.          Allergies   Allergen Reactions    Amoxicillin Hives     Other reaction(s): Not Indicated      Past Medical History:   Diagnosis Date    Hearing loss     History of heart attack     Hypertension     Kidney stone     NSTEMI (non-ST elevated myocardial infarction) (HCC) 10/20/2014    Last Assessment & Plan:  Formatting of this note might be different from the original. Coronary artery disease 10/2014  a. cath 10/23/2014 2.5 x 20 Synergy DES to the LAD, EF 40-45 percent b. relook cath 6/29, patent stents, medical management      Upper GI bleed 07/06/2015    Likely 2/2 NSAIDs, stable     Outpatient Medications Marked as Taking for the 05/11/23 encounter (Office Visit) with Abdel Samie, Sostenes Kauffmann, MD   Medication Sig Dispense Refill    nitroGLYCERIN  (NITROSTAT ) 0.4 MG SL tablet Place 1 tablet under the tongue daily as needed for  Chest pain up to max of 3 total doses. If no relief after 1 dose, call 911. 30 tablet 1    tamsulosin  (FLOMAX ) 0.4 MG capsule Take 1 capsule by mouth daily 90 capsule 3    atorvastatin  (LIPITOR) 80 MG tablet Take 1 tablet by mouth nightly at bedtime. 90 tablet 3    loratadine  (CLARITIN ) 10 MG tablet Take 1 tablet by mouth daily 90 tablet 1    dutasteride -tamsulosin  0.5-0.4 MG CAPS Take 1 capsule by mouth daily      lisinopril  (PRINIVIL ;ZESTRIL ) 10 MG tablet Take 1 tablet by mouth daily 90 tablet 3    cetirizine  (ZYRTEC ) 10 MG tablet Take 1 tablet by mouth daily 30 tablet 0    aspirin 81 MG EC tablet Take 1 tablet by mouth daily      metoprolol  tartrate (LOPRESSOR ) 25 MG tablet Take 1 tablet by mouth daily (Patient taking differently: Take 0.5 tablets by mouth 2 times daily) 60 tablet 12    calcium  carb-cholecalciferol 600-20 MG-MCG TABS Take 1 tablet by mouth daily      Ginkgo Biloba 40 MG TABS Take 60 mg by mouth daily      Multiple Vitamins-Minerals (MENS ONE DAILY PO) Take by mouth      vitamin D (CHOLECALCIFEROL) 25 MCG (1000 UT) TABS tablet Take 1 tablet by mouth daily      Garlic 1000 MG CAPS Take by mouth      zinc gluconate 50 MG tablet Take 1 tablet by mouth daily      omeprazole (PRILOSEC) 20 MG delayed release capsule Take 1 capsule by mouth daily       Past Surgical History:   Procedure Laterality Date    EAR SURGERY Left     OTHER SURGICAL HISTORY      heart attack/ stent placement    OTHER SURGICAL HISTORY      bleeding ulcer    VASECTOMY       Family History   Problem Relation Age of Onset    Mental Illness Mother     Dementia Mother     Heart Attack Mother     Heart Attack Sister     Other Son         blood clots    No Known  Problems Daughter      Social History     Tobacco Use   Smoking Status Never   Smokeless Tobacco Never     Social History     Socioeconomic History    Marital status: Radiation Protection Practitioner     Spouse name: None    Number of children: None    Years of education: None    Highest education  level: None   Tobacco Use    Smoking status: Never    Smokeless tobacco: Never   Vaping Use    Vaping status: Never Used   Substance and Sexual Activity    Alcohol use: Never    Drug use: Never    Sexual activity: Defer     Partners: Female     Social Determinants of Health     Physical Activity: Insufficiently Active (10/24/2022)    Exercise Vital Sign     Days of Exercise per Week: 3 days     Minutes of Exercise per Session: 20 min    Received from [employer]    Social Network    Received from [employer], [facility]    HITS     OB History   No obstetric history on file.       BP (!) 142/92   Pulse 84   Ht 1.753 m (5' 9)   Wt 93.6 kg (206 lb 6.4 oz)   SpO2 96%   BMI 30.48 kg/m    Physical Exam  Constitutional:       Appearance: Normal appearance.   HENT:      Head: Normocephalic and atraumatic.      Right Ear: External ear normal.      Left Ear: External ear normal.      Nose: Nose normal.      Mouth/Throat:      Mouth: Mucous membranes are moist.   Eyes:      Extraocular Movements: Extraocular movements intact.   Cardiovascular:      Rate and Rhythm: Normal rate and regular rhythm.      Pulses: Normal pulses.      Heart sounds: Normal heart sounds.   Abdominal:      General: Abdomen is flat. Bowel sounds are normal.      Palpations: Abdomen is soft.   Musculoskeletal:         General: Normal range of motion.      Cervical back: Normal range of motion and neck supple.   Skin:     General: Skin is warm.      Findings: No rash.   Neurological:      General: No focal deficit present.      Mental Status: He is alert and oriented to person, place, and time. Mental status is at baseline.   Psychiatric:         Mood and Affect: Mood normal.                An electronic signature was used to authenticate this note.    --Autry Droege Abdel Samie, MD

## 2023-08-20 ENCOUNTER — Ambulatory Visit: Admit: 2023-08-20 | Discharge: 2023-08-20 | Payer: MEDICARE | Attending: Physician Assistant | Primary: Family Medicine

## 2023-08-20 VITALS — BP 132/86 | HR 61 | Resp 16 | Ht 69.0 in | Wt 203.0 lb

## 2023-08-20 DIAGNOSIS — I2510 Atherosclerotic heart disease of native coronary artery without angina pectoris: Secondary | ICD-10-CM

## 2023-08-20 NOTE — Patient Instructions (Signed)
 Continue current meds.    Return to see [place] in 1 year or sooner if needed.

## 2023-08-20 NOTE — Progress Notes (Signed)
 Date:  August 20, 2023  Patient name: William Lloyd  Date of Birth: [birth year]      REASON FOR CLINIC VISIT: Follow-up      PRIMARY CARDIOLOGIST: Dr. Harris Liming    HISTORY OF PRESENT ILLNESS          Azazel Franze was seen in cardiology clinic today.     Patient is a 68 y.o. male with history of coronary artery disease status post MI in 2016, NSTEMI with DES to the mid LAD, mild ischemic cardiomyopathy with EF of 40 to 45% post MI.  EF has since improved.  He moved to the area and established care with us  last year.  Updated NST and echo were ordered.  He went into rapid A-fib at peak exercise during his stress test but had normal images.  He was symptomatic with it and has not had any other episodes of palpitations, so decision was made not to anticoagulate him based on this isolated episode.      PAST HISTORY          Past Medical History:   has a past medical history of Hearing loss, History of heart attack, Hypertension, Kidney stone, NSTEMI (non-ST elevated myocardial infarction) (HCC), and Upper GI bleed.    Past Surgical History:   has a past surgical history that includes Vasectomy; Ear surgery (Left); other surgical history; and other surgical history.     Social History:   reports that he has never smoked. He has never used smokeless tobacco. He reports that he does not drink alcohol and does not use drugs.     Family History: family history includes Dementia in his mother; Heart Attack in his mother and sister; Hypertension in his mother and sister; Mental Illness in his mother; No Known Problems in his daughter.    Allergies:  Amoxicillin    Home Medications:    Prior to Admission medications    Medication Sig Start Date End Date Taking? Authorizing Provider   atorvastatin  (LIPITOR) 80 MG tablet Take 1 tablet by mouth nightly at bedtime. 12/29/22  Yes Ballares, Abran Abrahams, APRN - CNP   loratadine  (CLARITIN ) 10 MG tablet Take 1 tablet by mouth daily 11/03/22  Yes Abdel Samie, Souzan, MD   dutasteride-tamsulosin   0.5-0.4 MG CAPS Take 1 capsule by mouth daily 09/24/21  Yes [provider]   lisinopril  (PRINIVIL ;ZESTRIL ) 10 MG tablet Take 1 tablet by mouth daily 08/13/22  Yes Ballares, Debra, APRN - CNP   cetirizine  (ZYRTEC ) 10 MG tablet Take 1 tablet by mouth daily 08/13/22  Yes Ballares, Debra, APRN - CNP   aspirin 81 MG EC tablet Take 1 tablet by mouth daily   Yes [provider]   metoprolol  tartrate (LOPRESSOR ) 25 MG tablet Take 1 tablet by mouth daily  Patient taking differently: Take 0.5 tablets by mouth 2 times daily 12/19/21  Yes Yuen, Danielle C, DO   calcium  carb-cholecalciferol 600-20 MG-MCG TABS Take 1 tablet by mouth daily   Yes [provider]   Ginkgo Biloba 40 MG TABS Take 60 mg by mouth daily   Yes [provider]   Multiple Vitamins-Minerals (MENS ONE DAILY PO) Take by mouth   Yes [provider]   vitamin D (CHOLECALCIFEROL) 25 MCG (1000 UT) TABS tablet Take 1 tablet by mouth daily   Yes [provider]   Garlic 1000 MG CAPS Take by mouth   Yes [provider]   zinc gluconate 50 MG tablet Take 1 tablet by mouth  daily   Yes [provider]   omeprazole (PRILOSEC) 20 MG delayed release capsule Take 1 capsule by mouth daily   Yes [provider]   nitroGLYCERIN  (NITROSTAT ) 0.4 MG SL tablet Place 1 tablet under the tongue daily as needed for Chest pain up to max of 3 total doses. If no relief after 1 dose, call 911. 05/11/23 07/10/23  Abdel Samie, Souzan, MD   tamsulosin  (FLOMAX ) 0.4 MG capsule Take 1 capsule by mouth daily  Patient not taking: Reported on 08/20/2023 12/29/22   Katherleen Pancoast, APRN - CNP         REVIEW OF SYSTEMS          Review of Systems   Constitutional:  Negative for activity change, fatigue, fever and unexpected weight change.   HENT:  Positive for hearing loss. Negative for nosebleeds.    Eyes:  Negative for visual disturbance.   Respiratory:  Negative for cough, chest tightness and wheezing.    Cardiovascular:   Negative for chest pain, palpitations and leg swelling.   Gastrointestinal:  Negative for abdominal pain, constipation, diarrhea and nausea.   Endocrine: Negative for polydipsia and polyuria.   Genitourinary:  Negative for flank pain.   Musculoskeletal:  Negative for joint swelling and myalgias.   Skin:  Negative for rash.   Neurological:  Negative for dizziness, syncope, weakness, light-headedness, numbness and headaches.   Psychiatric/Behavioral:  The patient is not nervous/anxious.        PHYSICAL EXAM            BP 132/86   Pulse 61   Resp 16   Ht 1.753 m (5\' 9" )   Wt 92.1 kg (203 lb)   SpO2 98%   BMI 29.98 kg/m      CONSTITUTIONAL:  awake, alert, and oriented, no apparent distress  LUNGS:  No increased work of breathing, good air exchange, clear to auscultation bilaterally, no crackles or wheezing  CARDIOVASCULAR:  Normal apical impulse, regular rate and rhythm, normal S1 and S2, no S3 or S4, and no murmur noted, no edema  ABDOMEN:  Soft, non-tender, non-distended   EXTREMITIES:  there is no redness, warmth, or swelling of the joints; full range of motion noted      DIAGNOSTIC STUDIES        EKG 08/20/23:  Sinus Bradycardia w/incomplete RBBB      Echocardiogram:  07/17/22    ECHO (TTE) COMPLETE (PRN CONTRAST/BUBBLE/STRAIN/3D) 07/17/2022  9:34 PM (Final)    Interpretation Summary    Left Ventricle: Normal left ventricular systolic function with a visually estimated EF of 55 - 60%. EF by 2D Simpsons Biplane is 58%. Left ventricle size is normal. Mildly increased wall thickness. Unable to assess wall motion. Global longitudinal strain is reduced with a value of -12.4%. Normal diastolic function.    Signed by: Earl Glassing, MD on 07/17/2022  9:34 PM      Stress test:  06/12/22    NM STRESS TEST WITH MYOCARDIAL PERFUSION 06/13/2022  5:14 PM (Final)    Interpretation Summary    Stress Combined Conclusion: The study is negative for exercise induced myocardial ischemia.    Fixed apical inferior defect is  artifact.    Post-stress LVEF 60%    7 METs    Atrial fibrillation with RVR at peak stress and into recovery which resolved with 5 mg iv metoprolol .    Pt advised to monitor heart rhythm via home Kardia monitor.  Addendum by: Earl Glassing, MD on 07/03/2022  4:57 AM    Signed by: Earl Glassing, MD on 06/13/2022  5:14 PM      ASSESSMENT / RECOMMENDATIONS        1. Coronary artery disease involving native coronary artery of native heart without angina pectoris  Stable with no evidence of angina. Continue aspirin and statin.  Lipids are at goal.  He will notify us  if he develops any palpitations.       Tomy France, [facility] / [facility]

## 2023-10-09 ENCOUNTER — Encounter

## 2023-10-09 NOTE — Telephone Encounter (Signed)
 Patient would like referral to GI for his colonoscopy. Loaded

## 2023-10-12 MED ORDER — METOPROLOL TARTRATE 25 MG PO TABS
25 | ORAL_TABLET | Freq: Two times a day (BID) | ORAL | 2 refills | 30.00000 days | Status: AC
Start: 2023-10-12 — End: 2024-10-06

## 2023-10-12 MED ORDER — DUTASTERIDE-TAMSULOSIN HCL 0.5-0.4 MG PO CAPS
0.5-0.4 | ORAL_CAPSULE | Freq: Every day | ORAL | 1 refills | Status: AC
Start: 2023-10-12 — End: ?

## 2023-10-12 NOTE — Addendum Note (Signed)
 Addended by: Evon Hoguet on: 10/12/2023 09:27 AM     Modules accepted: Orders

## 2023-10-12 NOTE — Telephone Encounter (Signed)
 Looks like he was on tamulosin 0.4mg  that we were prescribing but someone cancelled the order, during his visit at Cardiology. Maybe done by accident, he would like to continue this medication and needs a new script.

## 2023-10-12 NOTE — Telephone Encounter (Signed)
 RX loaded. Please advise.

## 2023-10-12 NOTE — Addendum Note (Signed)
 Addended by: Evon Hoguet on: 10/12/2023 02:18 PM     Modules accepted: Orders

## 2023-10-12 NOTE — Addendum Note (Signed)
 Addended by: Renaee Caro on: 10/12/2023 01:49 PM     Modules accepted: Orders

## 2023-10-20 NOTE — Telephone Encounter (Signed)
 lm for patient to reschedule appointment with Dr. Chriss Coup on 7/21. if patient calls back please reschedule.

## 2023-11-23 ENCOUNTER — Encounter: Attending: Family Medicine | Primary: Family Medicine

## 2023-11-25 ENCOUNTER — Ambulatory Visit: Admit: 2023-11-25 | Discharge: 2023-11-25 | Payer: Medicare (Managed Care) | Primary: Family Medicine

## 2023-11-25 DIAGNOSIS — S8002XA Contusion of left knee, initial encounter: Principal | ICD-10-CM

## 2023-11-25 MED ORDER — DICLOFENAC SODIUM 1 % EX GEL
1 | Freq: Four times a day (QID) | CUTANEOUS | 0 refills | Status: AC
Start: 2023-11-25 — End: ?

## 2023-11-25 NOTE — Progress Notes (Signed)
 CHIEF COMPLAINT:  Chief Complaint   Patient presents with    Knee Injury     Playing pickle ball yesterday he went to run for the ball lost balance and it the court. Left knee is bruise, sore and swollen. He can bend a little and he can extended it but sore        HISTORY OF PRESENT ILLNESS:  Patient was playing pickle ball yesterday.  He ran forward to the neck to catch a ball and landed directly onto the front of his left knee.  He was able to go home and iced and elevated but it is still pretty swollen and sore today.  He states he has had some arthritis symptoms in his right knee for a few weeks but it is not as severe as the left knee.  No prior history of knee injuries or surgeries.  He also injured his left index finger and hit the soft tissue of his right upper shoulder against a pole.  He states neither of those hurt as much as his knee does, and does not seem concerned for serious injury of these.      CURRENT MEDICATION LIST:    Current Outpatient Medications   Medication Sig Dispense Refill    dutasteride -tamsulosin  0.5-0.4 MG CAPS Take 1 capsule by mouth daily 30 capsule 1    metoprolol  tartrate (LOPRESSOR ) 25 MG tablet Take 0.5 tablets by mouth 2 times daily 90 tablet 2    atorvastatin  (LIPITOR) 80 MG tablet Take 1 tablet by mouth nightly at bedtime. 90 tablet 3    loratadine  (CLARITIN ) 10 MG tablet Take 1 tablet by mouth daily 90 tablet 1    lisinopril  (PRINIVIL ;ZESTRIL ) 10 MG tablet Take 1 tablet by mouth daily 90 tablet 3    cetirizine  (ZYRTEC ) 10 MG tablet Take 1 tablet by mouth daily 30 tablet 0    aspirin 81 MG EC tablet Take 1 tablet by mouth daily      calcium  carb-cholecalciferol 600-20 MG-MCG TABS Take 1 tablet by mouth daily      Ginkgo Biloba 40 MG TABS Take 60 mg by mouth daily      Multiple Vitamins-Minerals (MENS ONE DAILY PO) Take by mouth      vitamin D (CHOLECALCIFEROL) 25 MCG (1000 UT) TABS tablet Take 1 tablet by mouth daily      Garlic 1000 MG CAPS Take by mouth      zinc gluconate  50 MG tablet Take 1 tablet by mouth daily      omeprazole (PRILOSEC) 20 MG delayed release capsule Take 1 capsule by mouth daily      nitroGLYCERIN  (NITROSTAT ) 0.4 MG SL tablet Place 1 tablet under the tongue daily as needed for Chest pain up to max of 3 total doses. If no relief after 1 dose, call 911. 30 tablet 1     No current facility-administered medications for this visit.        ALLERGIES:    Allergies   Allergen Reactions    Amoxicillin Hives     Other reaction(s): Not Indicated        HISTORY:  Past Medical History:   Diagnosis Date    Hearing loss     History of heart attack     Hypertension     Kidney stone     NSTEMI (non-ST elevated myocardial infarction) (HCC) 10/20/2014    Last Assessment & Plan:  Formatting of this note might be different from the original. Coronary artery disease  10/2014  a. cath 10/23/2014 2.5 x 20 Synergy DES to the LAD, EF 40-45 percent b. relook cath 6/29, patent stents, medical management      Upper GI bleed 07/06/2015    Likely 2/2 NSAIDs, stable      Past Surgical History:   Procedure Laterality Date    EAR SURGERY Left     OTHER SURGICAL HISTORY      heart attack/ stent placement    OTHER SURGICAL HISTORY      bleeding ulcer    VASECTOMY        Social History     Socioeconomic History    Marital status: Radiation protection practitioner     Spouse name: Not on file    Number of children: Not on file    Years of education: Not on file    Highest education level: Not on file   Occupational History    Not on file   Tobacco Use    Smoking status: Never    Smokeless tobacco: Never   Vaping Use    Vaping status: Never Used   Substance and Sexual Activity    Alcohol use: Never    Drug use: Never    Sexual activity: Yes     Partners: Female   Other Topics Concern    Not on file   Social History Narrative    Not on file     Social Drivers of Health     Financial Resource Strain: Not on file   Food Insecurity: Not on file   Transportation Needs: Not on file   Physical Activity: Insufficiently Active  (10/24/2022)    Exercise Vital Sign     Days of Exercise per Week: 3 days     Minutes of Exercise per Session: 20 min   Stress: Not on file   Social Connections: Unknown (09/13/2021)    Received from [facility], [facility]    Social Network     Social Network: Not on file   Intimate Partner Violence: Unknown (08/05/2021)    Received from [facility], [facility]    HITS     Physically Hurt: Not on file     Insult or Talk Down To: Not on file     Threaten Physical Harm: Not on file     Scream or Curse: Not on file   Housing Stability: Not on file      Family History   Problem Relation Age of Onset    Mental Illness Mother     Dementia Mother     Heart Attack Mother     Hypertension Mother     Heart Attack Sister     Hypertension Sister     No Known Problems Daughter         REVIEW OF SYSTEMS    Review of Systems   Constitutional:  Negative for chills and fever.   Respiratory:  Negative for shortness of breath.    Cardiovascular:  Negative for chest pain.   Gastrointestinal:  Negative for abdominal pain.   Neurological:  Negative for dizziness and weakness.        PHYSICAL EXAM:    Physical Exam  Constitutional:       Appearance: Normal appearance.   HENT:      Head: Normocephalic.   Cardiovascular:      Rate and Rhythm: Normal rate.   Pulmonary:      Effort: Pulmonary effort is normal.   Musculoskeletal:  Cervical back: Neck supple.      Comments: L knee--grossly edematous, anterior superficial abrasion approximately 3 x 4 cm.  Tenderness to palpation anterior aspect including anterior medial joint line.  Full range of motion but painful.  No effusion in joint or patellar tap.  No valgus or varus instability.  Negative drawer and McMurray's.    Left index finger--slightly dark discoloration.  Has full range of motion of all joints with minimal pain    Left neck and shoulder--abrasion to proximal trapezius.  No neck stiffness   Skin:     General: Skin is warm and dry.   Neurological:      General: No focal  deficit present.      Mental Status: He is alert and oriented to person, place, and time.   Psychiatric:         Mood and Affect: Mood normal.     Left knee x-ray preliminary reading--mild degenerative changes/joint space narrowing, no acute process.      Vital Signs -   Visit Vitals  BP (!) 146/75   Pulse 63   Temp 98.5 F (36.9 C)   Resp 17   Ht 1.753 m (5' 9)   Wt 86.4 kg (190 lb 8 oz)   SpO2 98%   BMI 28.13 kg/m                LABS and XRAY  Results for orders placed or performed in visit on 11/25/23   XR KNEE LEFT (3 VIEWS)    Narrative    Left knee AP, oblique, and lateral: 11/25/23    INDICATION: S80.02XA,Contusion of left knee, initial encounter,ICD-10-CM. fall    COMPARISON: None.    FINDINGS: No fracture, dislocation, effusion, acute joint space abnormality, or   soft tissue abnormality. Mild-moderate medial compartmental joint space loss.      Impression    No acute fracture or dislocation. Medial compartmental joint space loss.              ASSESMENT AND PLAN:    Assessment & Plan  Contusion of left knee, initial encounter       Orders:    XR KNEE LEFT (3 VIEWS); Future    [facility] - [name] Prentice Chill MD, [facility]    diclofenac  sodium (VOLTAREN ) 1 % GEL; Apply 4 g topically 4 times daily    Fall from ground level            No fracture, will likely resolve with conservative treatment (RICE) but gave referral to Ortho for further follow-up if needed.  Wife asked for prescription topical, will send Voltaren  gel but be cautious using around open skin (abrasion).  Patient wife voiced understanding.      EDUCATION:   There are no Patient Instructions on file for this visit.        Follow up and Dispositions:  No follow-ups on file.       Darice Shiv Shuey, PA    I have reviewed prior visit notes and lab results pertinent to this visit. Point of care results and imaging were independently interpreted by myself and discussed with the patient. Educated the patient on disease process, expected course  of illness, and management. Educated the patient on how to administer prescribed medications and possible side effects.  Patient instructed to follow-up immediately for any new or worsening symptoms.  Patient verbalized understanding and agreement with plan of care.    Please note that this note was generated using voice  Science writer.  Although every effort was made to ensure the accuracy of this automated transcription, some errors in transcription may have occurred.

## 2023-11-25 NOTE — Telephone Encounter (Signed)
 Spoke with William Lloyd and they are already going to Urgent Care.

## 2023-11-25 NOTE — Telephone Encounter (Signed)
 Friend called  stating patient hurt knee playing pickle ball and requesting to be seen today,please call and assist

## 2023-12-04 ENCOUNTER — Ambulatory Visit
Admit: 2023-12-04 | Discharge: 2023-12-04 | Payer: Medicare (Managed Care) | Attending: Student in an Organized Health Care Education/Training Program | Primary: Family Medicine

## 2023-12-04 DIAGNOSIS — M7042 Prepatellar bursitis, left knee: Principal | ICD-10-CM

## 2023-12-04 MED ORDER — METHYLPREDNISOLONE 4 MG PO TBPK
4 | PACK | ORAL | 0 refills | 6.00000 days | Status: AC
Start: 2023-12-04 — End: ?

## 2023-12-04 NOTE — Progress Notes (Signed)
 William Lloyd. [name], MD  Primary Care Sports Medicine Physician  Sports Medicine  Phone [phone]  Fax [phone]    [address]   [address]  [place], [place]  [address]  [place], [place]  [address]    12/04/2023       Name: William Lloyd  Age:  68 y.o.  Sex:  male  DOB:  [birth year]     MRN:  [id number]      Chief Complaint:     Knee Injury (Left)       History of Present Illness:     William Lloyd  is a pleasant 68 y.o. male who presents today for evaluation of his  Left knee pain. Patient states that their pain began after playing pickle ball on 11/24/2023. Patient localizes their pain over the anterior, medial, lateral aspect of his Left knee without radiation. He describes his pain as constant, aching, throbbing, and burning in nature. His pain is worse with activity, bending/squatting, kneeling, range of motion, rising from a seated position, and navigating stairs. He reports trying rest, ice, and OTC medications with some relief. Patient denies any recent falls or injuries. Patient would like to discuss further treatment options of his Left knee pain today.    Past Medical History:         Diagnosis Date    Hearing loss     History of heart attack     Hypertension     Kidney stone     NSTEMI (non-ST elevated myocardial infarction) (HCC) 10/20/2014    Last Assessment & Plan:  Formatting of this note might be different from the original. Coronary artery disease 10/2014  a. cath 10/23/2014 2.5 x 20 Synergy DES to the LAD, EF 40-45 percent b. relook cath 6/29, patent stents, medical management      Upper GI bleed 07/06/2015    Likely 2/2 NSAIDs, stable      Past Surgical History:         Procedure Laterality Date    EAR SURGERY Left     OTHER SURGICAL HISTORY      heart attack/ stent placement    OTHER SURGICAL HISTORY      bleeding ulcer    VASECTOMY        Family History:     Family History   Problem Relation Age of Onset    Mental Illness Mother     Dementia Mother     Heart Attack Mother      Hypertension Mother     Heart Attack Sister     Hypertension Sister     No Known Problems Daughter       Social History:     Social History     Occupational History    Not on file   Tobacco Use    Smoking status: Never    Smokeless tobacco: Never   Vaping Use    Vaping status: Never Used   Substance and Sexual Activity    Alcohol use: Never    Drug use: Never    Sexual activity: Yes     Partners: Female      Allergies:     Allergies   Allergen Reactions    Amoxicillin Hives     Other reaction(s): Not Indicated      Medications:     Outpatient Medications Marked as Taking for the 12/04/23 encounter (Office Visit) with William William Chill, MD   Medication Sig Dispense Refill  methylPREDNISolone  (MEDROL  DOSEPACK) 4 MG tablet Take by mouth as directed 1 kit 0      Physical Examination:   General: Well-appearing male in no apparent distress accompanied by his spouse.    LEFT KNEE PHYSICAL EXAMINATION:     Observation  Wound/Incision:    None  Abrasion:     Positive.  Healed abrasion over the anterior aspect of his left knee over the patella.  Inspection:     No Erythema and Swelling  Swelling:     Moderate  Effusion:     None  Prepatellar Bursitis   Positive  Alignment:     Normal  Quadricept Atrophy    Negative    Patella Assessment:  Patella Apprehension:   Negative  Patella Tracking:    Normal  Patella Position:    Normal  Q-Angle:     ---    Palpation:  Global Knee Tenderness  Mildly Positive  Patella Compression Test  Positive  Tibial Tubercle    Negative  Medial Plica    Negative  Medial Retinaculum   Mildly Positive  Lateral Retinaculum   Mildly Positive  IT Band    Negative  Pes Anserinus    Negative  MCL Tenderness   Mildly Positive  LCL Tenderness   Mildly Positive  Medial Joint Line Tenderness  Positive  Lateral Joint Line Tenderness  Positive  Quadricep    Mildly Positive  Patella Tendon   Negative, Negative Defect  Baker Cyst    negative    Range of  motion  Extension    0  Flexion     70  Crepitus    Patellofemoral    Strength:  Extension    5 / 5  Flexion     5 / 5    Special tests:  Lachmann's    Stable  Anterior Drawer   Stable  Posterior Drawer   Stable  Pivot Shift    Negative  Dial Test    Negative  Varus at 0 Deg   Stable  Varus at 30 Deg   Stable  Valgus at 0 Deg   Stable  Valgus at 30 Deg   Stable  Medial McMurray's   Negative  Lateral McMurray's   Negative    Neurovascular exam:  2+ DP and PT Pulses with Brisk capillary refill  Sensation intact light touch distally  Imaging:   I independently reviewed and interpreted left knee x-rays obtained on November 25, 2023 which are notable for no fracture or dislocation but did reveal mild joint space narrowing in the medial compartment and patellofemoral joint as well as patellofemoral joint osteophytosis.  Superficial soft tissue swelling just superficial to patella.    Assessment & Plan:   William Lloyd was seen today for knee injury.    Diagnoses and all orders for this visit:    Prepatellar bursitis of left knee    Acute pain of left knee    Other orders  -     methylPREDNISolone  (MEDROL  DOSEPACK) 4 MG tablet; Take by mouth as directed          68 year old male here for evaluation of left anterior knee pain and swelling following a hard fall while playing pickle ball a week and a half ago.  His exam is notable for moderate soft tissue swelling in his anterior left knee that extends down into his lower leg.  He does have tenderness to palpation along his medial and lateral joint line as well as medial and  lateral patellar retinaculum.  No overlying erythema.  I performed a bedside ultrasound which did reveal fluid in the soft tissue most consistent with prepatellar bursitis that is traumatic in nature after his hard fall.  No sign of infection with no overlying erythema.  Recommended conservative management with compression, anti-inflammatories, icing, elevating.  Discussed that his soft tissue swelling may take  weeks to months to improve.  We did address possibly aspirating fluid but we held off due to increased risk for infection with aspiration.  Encourage patient to reach out to our team if he develops any type of significant redness concerning for infection.  Also encourage patient to follow-up with her team if he has any type of locking or catching symptoms and we will consider advanced imaging.  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  Provided patient with a Medrol  Dosepak and discussed side effect profile of this medication.  Patient will follow-up with me as needed.  Patient verbalized understanding and is in agreement with management plan.  Procedure(s):       None       Disclaimer: This note was dictated using speech recognition. Minor errors in transcription may be present. Please contact creator for further clarification.

## 2023-12-05 ENCOUNTER — Inpatient Hospital Stay: Admit: 2023-12-05 | Discharge: 2023-12-05 | Payer: Medicare (Managed Care) | Primary: Family Medicine

## 2023-12-05 ENCOUNTER — Encounter

## 2023-12-05 DIAGNOSIS — E785 Hyperlipidemia, unspecified: Principal | ICD-10-CM

## 2023-12-05 LAB — CBC WITH AUTO DIFFERENTIAL
Basophils %: 0.5 % (ref 0.0–2.0)
Basophils Absolute: 0 x10e3/mcL (ref 0.0–0.2)
Eosinophils %: 1.5 % (ref 0.0–7.0)
Eosinophils Absolute: 0.1 x10e3/mcL (ref 0.0–0.5)
Hematocrit: 43.3 % (ref 38.0–52.0)
Hemoglobin: 13.9 g/dL (ref 13.0–17.3)
Immature Grans (Abs): 0.03 x10e3/mcL (ref 0.00–0.06)
Immature Granulocytes %: 0.4 % (ref 0.0–0.6)
Lymphocytes Absolute: 1.3 x10e3/mcL (ref 1.0–3.2)
Lymphocytes: 16.2 % (ref 15.0–45.0)
MCH: 31.6 pg (ref 27.0–34.5)
MCHC: 32.1 g/dL (ref 30.0–36.0)
MCV: 98.4 fL (ref 84.0–100.0)
MPV: 9.6 fL (ref 7.0–12.2)
Monocytes %: 10.4 % (ref 4.0–12.0)
Monocytes Absolute: 0.9 x10e3/mcL (ref 0.3–1.0)
Neutrophils %: 71 % (ref 42.0–74.0)
Neutrophils Absolute: 5.8 x10e3/mcL (ref 1.6–7.3)
Platelets: 228 x10e3/mcL (ref 140–440)
RBC: 4.4 x10e6/mcL (ref 4.00–5.60)
RDW: 13.3 % (ref 10.0–17.0)
WBC: 8.2 x10e3/mcL (ref 3.8–10.6)

## 2023-12-05 LAB — COMPREHENSIVE METABOLIC PANEL
ALT: 28 U/L (ref 0–42)
AST: 27 U/L (ref 0–46)
Albumin/Globulin Ratio: 1.75 (ref 1.00–2.70)
Albumin: 4.2 g/dL (ref 3.5–5.2)
Alk Phosphatase: 108 U/L (ref 40–130)
Anion Gap: 10 mmol/L (ref 2–17)
BUN: 29 mg/dL — ABNORMAL HIGH (ref 8–23)
CALCIUM,CORRECTED,CCA: 9.4 mg/dL (ref 8.5–10.7)
CO2: 26 mmol/L (ref 22–29)
Calcium: 9.5 mg/dL (ref 8.5–10.7)
Chloride: 106 mmol/L (ref 98–107)
Creatinine: 1.3 mg/dL (ref 0.7–1.3)
Est, Glom Filt Rate: 60 mL/min/1.73mÂ² (ref >=60)
Globulin: 2.4 g/dL (ref 1.9–4.4)
Glucose: 103 mg/dL — ABNORMAL HIGH (ref 70–99)
Osmolaliy Calculated: 289 mosm/kg — ABNORMAL HIGH (ref 270–287)
Potassium: 5.4 mmol/L — ABNORMAL HIGH (ref 3.5–5.3)
Sodium: 142 mmol/L (ref 135–145)
Total Bilirubin: 0.74 mg/dL (ref 0.00–1.20)
Total Protein: 6.6 g/dL (ref 5.7–8.3)

## 2023-12-05 LAB — LIPID PANEL
Chol/HDL Ratio: 2.2 (ref 0.0–4.4)
Cholesterol, Total: 99 mg/dL — ABNORMAL LOW (ref 100–200)
HDL: 44 mg/dL (ref >=40)
LDL Cholesterol: 38.9 mg/dL (ref 0.0–100.0)
LDL/HDL Ratio: 0.9
Triglycerides: 78 mg/dL (ref 0–149)
VLDL: 15.5 mg/dL (ref 5.0–40.0)

## 2023-12-05 LAB — HEMOGLOBIN A1C
Estimated Avg Glucose: 123
Estimated Avg Glucose: 133
Hemoglobin A1C: 5.9 % (ref 4.0–6.0)

## 2023-12-07 ENCOUNTER — Ambulatory Visit
Admit: 2023-12-07 | Discharge: 2023-12-07 | Payer: Medicare (Managed Care) | Attending: Family Medicine | Primary: Family Medicine

## 2023-12-07 DIAGNOSIS — Z0000 Encounter for general adult medical examination without abnormal findings: Principal | ICD-10-CM

## 2023-12-07 LAB — PSA SCREENING: PSA, Screening: 3.93 ng/mL (ref 0.000–4.000)

## 2023-12-07 NOTE — Progress Notes (Signed)
 Medicare Annual Wellness Visit    William Lloyd is here for Medicare AWV (Orthopedics-Dr.Smith/Cardiology- Dr.Pinto/) and Other (Hurt leg playing pickleball. Fluid on knee. Has seen urgent care and ortho )    Assessment & Plan   Medicare annual wellness visit, subsequent  Comments:  discussed recent stable labs  colonoscopy was updated in june and will repeat in 5 years  Left leg swelling  Comments:  will order US    follow up with ortho as recommended  and continue with [facility]  Orders:  -     Vascular duplex lower extremity venous left; Future  Hyperkalemia  Comments:  will repeat labs and adjust plan as indicated  Orders:  -     Potassium; Future  Benign prostatic hyperplasia (BPH) with straining on urination  Comments:  stable PSA  Hyperlipidemia, unspecified hyperlipidemia type  Comments:  on lipitor  well tolerated   will continue  S/P drug eluting coronary stent placement  Comments:  denies any symptoms  lipid panel at goal  will continue ASA and statin       Return in about 6 months (around 06/08/2024).     Subjective   The following acute and/or chronic problems were also addressed today:  History of Present Illness  The patient presents for evaluation of leg pain and his annual visit     He reports an improvement in his condition, with the exception of a leg injury sustained while playing pickleball. On 11/24/2023, he lost his balance and fell onto a pole while playing, resulting in significant swelling and bruising. He sought emergency care and was subsequently referred to Dr. Prentice Sharps, an orthopedic specialist. An ultrasound and x-ray were performed, revealing no ligament damage but significant fluid accumulation. Due to the risk of infection, the decision was made not to drain the fluid. He was prescribed anti-inflammatory medication, which he started taking yesterday morning. He experiences pain in his ankle due to the pressure from the swelling and describes the area as sensitive and painful upon  standing. He was advised to apply ice and elevate the leg. He was informed that if the discomfort persists, an MRI will be considered. He has been unable to exercise since the incident.     He reports no shortness of breath or chest pain. He also mentions difficulty bending his leg, particularly when getting into a car.    He had a colonoscopy in 10/2023 and was told to follow up in 5 years.    His blood pressure has always been on the high side.       Patient's complete Health Risk Assessment and screening values have been reviewed and are found in Flowsheets. The following problems were reviewed today and where indicated follow up appointments were made and/or referrals ordered.    Positive Risk Factor Screenings with Interventions:             General HRA Questions:  Select all that apply: (!) New or Increased Pain  Interventions - Pain:  Patient declined any further interventions or treatment      Inactivity:  On average, how many days per week do you engage in moderate to strenuous exercise (like a brisk walk)?: 2 days (!) Abnormal  On average, how many minutes do you engage in exercise at this level?: 90 min  Interventions:  Patient advised to follow up in the office for further evaluation and treatment       Hearing Screen:  Do you or your family notice any  trouble with your hearing that hasn't been managed with hearing aids?: (!) Yes    Interventions:  Patient declines any further evaluation or treatment    Vision Screen:  Do you have difficulty driving, watching TV, or doing any of your daily activities because of your eyesight?: No  Have you had an eye exam within the past year?: (!) No  Interventions:   Patient encouraged to make appointment with their eye specialist       Advanced Directives:  Do you have a Living Will?: (!) No    Intervention:              Objective   Vitals:    12/07/23 1131   BP: (!) 142/72   Pulse: 72   SpO2: 98%   Weight: 86.7 kg (191 lb 1.6 oz)   Height: 1.753 m (5' 9)      Body  mass index is 28.22 kg/m.        General Appearance: alert and oriented to person, place and time, well developed and well- nourished, in no acute distress  Skin: warm and dry, no rash or erythema  Head: normocephalic and atraumatic  Eyes: pupils equal, round, and reactive to light, extraocular eye movements intact, conjunctivae normal  ENT: tympanic membrane, external ear and ear canal normal bilaterally, nose without deformity, nasal mucosa and turbinates normal without polyps  Neck: supple and non-tender without mass, no thyromegaly or thyroid nodules, no cervical lymphadenopathy  Pulmonary/Chest: clear to auscultation bilaterally- no wheezes, rales or rhonchi, normal air movement, no respiratory distress  Cardiovascular: normal rate, regular rhythm, normal S1 and S2, no murmurs, rubs, clicks, or gallops, distal pulses intact, no carotid bruits  Abdomen: soft, non-tender, non-distended, normal bowel sounds, no masses or organomegaly  Extremities: no cyanosis, clubbing , brusing and swelling noted in the left leg and around the left knee   Musculoskeletal: normal range of motion, no joint swelling, deformity or tenderness  Neurologic: reflexes normal and symmetric, no cranial nerve deficit, gait, coordination and speech normal            Allergies   Allergen Reactions    Amoxicillin Hives     Other reaction(s): Not Indicated     Prior to Visit Medications    Medication Sig Taking? Authorizing Provider   methylPREDNISolone  (MEDROL  DOSEPACK) 4 MG tablet Take by mouth as directed Yes [name] Prentice Chill, MD   diclofenac  sodium (VOLTAREN ) 1 % GEL Apply 4 g topically 4 times daily Yes Puritis, Darice, PA   dutasteride -tamsulosin  0.5-0.4 MG CAPS Take 1 capsule by mouth daily Yes Abdel Samie, Taedyn Glasscock, MD   metoprolol  tartrate (LOPRESSOR ) 25 MG tablet Take 0.5 tablets by mouth 2 times daily Yes Abdel Samie, Kateline Kinkade, MD   nitroGLYCERIN  (NITROSTAT ) 0.4 MG SL tablet Place 1 tablet under the tongue daily as needed for Chest pain  up to max of 3 total doses. If no relief after 1 dose, call 911. Yes Abdel Samie, Tyreece Gelles, MD   atorvastatin  (LIPITOR) 80 MG tablet Take 1 tablet by mouth nightly at bedtime. Yes Ballares, Debra, APRN - CNP   lisinopril  (PRINIVIL ;ZESTRIL ) 10 MG tablet Take 1 tablet by mouth daily Yes Ballares, Debra, APRN - CNP   cetirizine  (ZYRTEC ) 10 MG tablet Take 1 tablet by mouth daily Yes Ballares, Debra, APRN - CNP   aspirin 81 MG EC tablet Take 1 tablet by mouth daily Yes [provider]   calcium  carb-cholecalciferol 600-20 MG-MCG TABS Take 1 tablet by mouth daily Yes  [provider]   Ginkgo Biloba 40 MG TABS Take 60 mg by mouth daily Yes [provider]   Multiple Vitamins-Minerals (MENS ONE DAILY PO) Take by mouth Yes [provider]   vitamin D (CHOLECALCIFEROL) 25 MCG (1000 UT) TABS tablet Take 1 tablet by mouth daily Yes [provider]   Garlic 1000 MG CAPS Take by mouth Yes [provider]   zinc gluconate 50 MG tablet Take 1 tablet by mouth daily Yes [provider]   omeprazole (PRILOSEC) 20 MG delayed release capsule Take 1 capsule by mouth daily Yes [provider]   loratadine  (CLARITIN ) 10 MG tablet Take 1 tablet by mouth daily  Patient not taking: Reported on 12/07/2023  Abdel Samie, Shabria Egley, MD       CareTeam (Including outside providers/suppliers regularly involved in providing care):   Patient Care Team:  Abdel Samie, Baelyn Doring, MD as PCP - General (Family Medicine)  Heber Babcock, Tyechia Allmendinger, MD as PCP - Empaneled Provider  Mallie Repress, MD as Cardiologist (Cardiology)     Recommendations for Preventive Services Due: see orders and patient instructions/AVS.  Recommended screening schedule for the next 5-10 years is provided to the patient in written form: see Patient Instructions/AVS.     Reviewed and updated this visit:  Tobacco  Allergies  Meds  Med Hx  Surg Hx  Fam Hx  Sexual Hx

## 2023-12-07 NOTE — Patient Instructions (Signed)
 Learning About Being Active as an Older Adult  Why is being active important as you get older?     Being active is one of the best things you can do for your health. And it's never too late to start. Being active--or getting active, if you aren't already--has definite benefits. It can:  Give you more energy,  Keep your mind sharp.  Improve balance to reduce your risk of falls.  Help you manage chronic illness with fewer medicines.  No matter how old you are, how fit you are, or what health problems you have, there is a form of activity that will work for you. And the more physical activity you can do, the better your overall health will be.  What kinds of activity can help you stay healthy?  Being more active will make your daily activities easier. Physical activity includes planned exercise and things you do in daily life. There are four types of activity:  Aerobic.  Doing aerobic activity makes your heart and lungs strong.  Includes walking, dancing, and gardening.  Aim for at least 2 hours spread throughout the week.  It improves your energy and can help you sleep better.  Muscle-strengthening.  This type of activity can help maintain muscle and strengthen bones.  Includes climbing stairs, using resistance bands, and lifting or carrying heavy loads.  Aim for at least twice a week.  It can help protect the knees and other joints.  Stretching.  Stretching gives you better range of motion in joints and muscles.  Includes upper arm stretches, calf stretches, and gentle yoga.  Aim for at least twice a week, preferably after your muscles are warmed up from other activities.  It can help you function better in daily life.  Balancing.  This helps you stay coordinated and have good posture.  Includes heel-to-toe walking, tai chi, and certain types of yoga.  Aim for at least 3 days a week.  It can reduce your risk of falling.  Even if you have a hard time meeting the recommendations, it's better to be more active  than less active. All activity done in each category counts toward your weekly total. You'd be surprised how daily things like carrying groceries, keeping up with grandchildren, and taking the stairs can add up.  What keeps you from being active?  If you've had a hard time being more active, you're not alone. Maybe you remember being able to do more. Or maybe you've never thought of yourself as being active. It's frustrating when you can't do the things you want. Being more active can help. What's holding you back?  Getting started.  Have a goal, but break it into easy tasks. Small steps build into big accomplishments.  Staying motivated.  If you feel like skipping your activity, remember your goal. Maybe you want to move better and stay independent. Every activity gets you one step closer.  Not feeling your best.  Start with 5 minutes of an activity you enjoy. Prove to yourself you can do it. As you get comfortable, increase your time.  You may not be where you want to be. But you're in the process of getting there. Everyone starts somewhere.  How can you find safe ways to stay active?  Talk with your doctor about any physical challenges you're facing. Make a plan with your doctor if you have a health problem or aren't sure how to get started with activity.  If you're already active, ask your doctor if  there is anything you should change to stay safe as your body and health change.  If you tend to feel dizzy after you take medicine, avoid activity at that time. Try being active before you take your medicine. This will reduce your risk of falls.  If you plan to be active at home, make sure to clear your space before you get started. Remove things like TV cords, coffee tables, and throw rugs. It's safest to have plenty of space to move freely.  The key to getting more active is to take it slow and steady. Try to improve only a little bit at a time. Pick just one area to improve on at first. And if an activity hurts,  stop and talk to your doctor.  Where can you learn more?  Go to [employer] and enter P600 to learn more about Learning About Being Active as an Older Adult.  Current as of: December 03, 2022  Content Version: 14.5   [address], [place].   Care instructions adapted under license by [facility]. If you have questions about a medical condition or this instruction, always ask your healthcare professional. Romayne Alderman, [facility], disclaims any warranty or liability for your use of this information.         Hearing Loss: Care Instructions  Overview     Hearing loss is a sudden or slow decrease in how well you hear. It can range from slight to profound. Permanent hearing loss can occur with aging. It also can happen when you are exposed long-term to loud noise. Examples include listening to loud music, riding motorcycles, or being around other loud machines.  Hearing loss can affect your work and home life. It can make you feel lonely or depressed. You may feel that you have lost your independence. But hearing aids and other devices can help you hear better and feel connected to others.  Follow-up care is a key part of your treatment and safety. Be sure to make and go to all appointments, and call your doctor if you are having problems. It's also a good idea to know your test results and keep a list of the medicines you take.  How can you care for yourself at home?  Avoid loud noises whenever possible. This helps keep your hearing from getting worse.  Always wear hearing protection around loud noises.  Wear a hearing aid as directed.  A professional can help you pick a hearing aid that will work best for you.  You can also get hearing aids over the counter for mild to moderate hearing loss.  Have hearing tests as your doctor suggests. They can show whether your hearing has changed. Your hearing aid may need to be adjusted.  Use other devices as needed. These may include:  Telephone  amplifiers and hearing aids that can connect to a television, stereo, radio, or microphone.  Devices that use lights or vibrations. These alert you to the doorbell, a ringing telephone, or a baby monitor.  Television closed-captioning. This shows the words at the bottom of the screen. Most new TVs can do this.  TTY (text telephone). This lets you type messages back and forth on the telephone instead of talking or listening. These devices are also called TDD. When messages are typed on the keyboard, they are sent over the phone line to a receiving TTY. The message is shown on a monitor.  Use text messaging, social media, and email if it is hard for you to  communicate by telephone.  Try to learn a listening technique called speechreading. It is not lipreading. You pay attention to people's gestures, expressions, posture, and tone of voice. These clues can help you understand what a person is saying. Face the person you are talking to, and have them face you. Make sure the lighting is good. You need to see the other person's face clearly.  Think about counseling if you need help to adjust to your hearing loss.  When should you call for help?  Watch closely for changes in your health, and be sure to contact your doctor if:    You think your hearing is getting worse.     You have new symptoms, such as dizziness or nausea.   Where can you learn more?  Go to [employer] and enter R798 to learn more about Hearing Loss: Care Instructions.  Current as of: March 01, 2023  Content Version: 14.5   [address] [place].   Care instructions adapted under license by [facility]. If you have questions about a medical condition or this instruction, always ask your healthcare professional. Romayne Alderman, [facility], disclaims any warranty or liability for your use of this information.         Learning About Vision Tests  What are vision tests?     The four most common vision tests are visual  acuity tests, refraction, visual field tests, and color vision tests.  Visual acuity (sharpness) tests  These tests are used:  To see if you need glasses or contact lenses.  To monitor an eye problem.  To check an eye injury.  Visual acuity tests are done as part of routine exams. You may also have this test when you get your driver's license or apply for some types of jobs.  Visual field tests  These tests are used:  To check for vision loss in any area of your range of vision.  To screen for certain eye diseases.  To look for nerve damage after a stroke, head injury, or other problem that could reduce blood flow to the brain.  Refraction and color tests  A refraction test is done to find the right prescription for glasses and contact lenses.  A color vision test is done to check for color blindness.  Color vision is often tested as part of a routine exam. You may also have this test when you apply for a job where recognizing different colors is important, such as truck driving, Optician, dispensing, or the [employer].  How are vision tests done?  Visual acuity test   You cover one eye at a time.  You read aloud from a wall chart across the room.  You read aloud from a small card that you hold in your hand.  Refraction   You look into a special device.  The device puts lenses of different strengths in front of each eye to see how strong your glasses or contact lenses need to be.  Visual field tests   Your doctor may have you look through special machines.  Or your doctor may simply have you stare straight ahead while they move a finger into and out of your field of vision.  Color vision test   You look at pieces of printed test patterns in various colors. You say what number or symbol you see.  Your doctor may have you trace the number or symbol using a pointer.  How do these tests feel?  There is very little chance of  having a problem from this test. If dilating drops are used for a vision test, they may make the eyes sting  and cause a medicine taste in the mouth.  Follow-up care is a key part of your treatment and safety. Be sure to make and go to all appointments, and call your doctor if you are having problems. It's also a good idea to know your test results and keep a list of the medicines you take.  Where can you learn more?  Go to [employer] and enter G551 to learn more about Learning About Vision Tests.  Current as of: December 03, 2022  Content Version: 14.5   [address] [place].   Care instructions adapted under license by [facility]. If you have questions about a medical condition or this instruction, always ask your healthcare professional. Romayne Alderman, [facility], disclaims any warranty or liability for your use of this information.         Advance Directives: Care Instructions  Overview  An advance directive is a legal way to state your wishes at the end of your life. It tells your loved ones and doctor what to do if you can't say what you want.  There are two main types of advance directives. You can change them any time your wishes change.  Living will. This form tells your loved ones and doctor your wishes about life support and other treatment. The form is also called a declaration.  Medical power of attorney. This form lets you name a person to make treatment decisions for you when you can't speak for yourself. This person is called a health care agent (health care proxy, health care surrogate). The form is also called a durable power of attorney for health care.  If you do not have an advance directive, decisions about your medical care may be made by a family member or doctor who doesn't know you or by a judge.  It may help to think of an advance directive as a gift to the people who care for you. If you have one, they won't have to make tough decisions by themselves.  For more information, including forms for your state, see the [employer] website  ([employer]).  Follow-up care is a key part of your treatment and safety. Be sure to make and go to all appointments, and call your doctor if you are having problems. It's also a good idea to know your test results and keep a list of the medicines you take.  What should you include in an advance directive?  Many states have a unique advance directive form. (It may ask you to address specific issues.) Or you might use a universal form that's approved by many states.  If your form doesn't tell you what to address, it may be hard to know what to include in your advance directive. Use the questions below to help you get started.  Who do you want to make decisions about your medical care if you are not able to?  What life-support measures do you want if you have a serious illness that gets worse over time or can't be cured?  What are you most afraid of that might happen? (Maybe you're afraid of having pain, losing your independence, or being kept alive by machines.)  Where would you prefer to die? (Your home? A hospital? A nursing home?)  Do you want to donate your organs when you die?  Do you want certain religious practices performed before you die?  When should you call for help?  Be sure to contact your doctor if you have any questions.  Where can you learn more?  Go to [employer] and enter R264 to learn more about Advance Directives: Care Instructions.  Current as of: September 02, 2022  Content Version: 14.5   [address] [place].   Care instructions adapted under license by [facility]. If you have questions about a medical condition or this instruction, always ask your healthcare professional. Romayne Alderman, [facility], disclaims any warranty or liability for your use of this information.         A Healthy Heart: Care Instructions  Overview     Coronary artery disease, also called heart disease, occurs when a substance called plaque builds  up in the vessels that supply oxygen-rich blood to your heart muscle. This can narrow the blood vessels and reduce blood flow. A heart attack happens when blood flow is completely blocked. A high-fat diet, smoking, and other factors increase the risk of heart disease.  Your doctor has found that you have a chance of having heart disease. A heart-healthy lifestyle can help keep your heart healthy and prevent heart disease. This lifestyle includes eating healthy, being active, staying at a weight that's healthy for you, and not smoking or using tobacco. It also includes taking medicines as directed, managing other health conditions, and trying to get a healthy amount of sleep.  Follow-up care is a key part of your treatment and safety. Be sure to make and go to all appointments, and call your doctor if you are having problems. It's also a good idea to know your test results and keep a list of the medicines you take.  How can you care for yourself at home?  Diet    Use less salt when you cook and eat. This helps lower your blood pressure. Taste food before salting. Add only a little salt when you think you need it. With time, your taste buds will adjust to less salt.     Eat fewer snack items, fast foods, canned soups, and other high-salt, high-fat, processed foods.     Read food labels and try to avoid saturated and trans fats. They increase your risk of heart disease by raising cholesterol levels.     Limit the amount of solid fat--butter, margarine, and shortening--you eat. Use olive, peanut, or canola oil when you cook. Bake, broil, and steam foods instead of frying them.     Eat a variety of fruit and vegetables every day. Dark green, deep orange, red, or yellow fruits and vegetables are especially good for you. Examples include spinach, carrots, peaches, and berries.     Foods high in fiber can reduce your cholesterol and provide important vitamins and minerals. High-fiber foods include whole-grain cereals and  breads, oatmeal, beans, brown rice, citrus fruits, and apples.     Eat lean proteins. Heart-healthy proteins include seafood, lean meats and poultry, eggs, beans, peas, nuts, seeds, and soy products.     Limit drinks and foods with added sugar. These include candy, desserts, and soda pop.   Heart-healthy lifestyle    If your doctor recommends it, get more exercise. For many people, walking is a good choice. Or you may want to swim, bike, or do other activities. Bit by bit, increase the time you're active every day. Try for at least 30 minutes on most days of the week.     Try to quit or cut back on using tobacco  and other nicotine products. This includes smoking and vaping. If you need help quitting, talk to your doctor about stop-smoking programs and medicines. These can increase your chances of quitting for good. Quitting is one of the most important things you can do to protect your heart. It is never too late to quit. Try to avoid secondhand smoke too.     Stay at a weight that's healthy for you. Talk to your doctor if you need help losing weight.     Try to get 7 to 9 hours of sleep each night.     Limit alcohol to 2 drinks a day for men and 1 drink a day for women. Too much alcohol can cause health problems.     Manage other health problems such as diabetes, high blood pressure, and high cholesterol. If you think you may have a problem with alcohol or drug use, talk to your doctor.   Medicines    Take your medicines exactly as prescribed. Call your doctor if you think you are having a problem with your medicine.     If your doctor recommends aspirin, take the amount directed each day. Make sure you take aspirin and not another kind of pain reliever, such as acetaminophen (Tylenol).   When should you call for help?   Call 911 if you have symptoms of a heart attack. These may include:    Chest pain or pressure, or a strange feeling in the chest.     Sweating.     Shortness of breath.     Pain, pressure, or a  strange feeling in the back, neck, jaw, or upper belly or in one or both shoulders or arms.     Lightheadedness or sudden weakness.     A fast or irregular heartbeat.   After you call 911, the operator may tell you to chew 1 adult-strength or 2 to 4 low-dose aspirin. Wait for an ambulance. Do not try to drive yourself.  Watch closely for changes in your health, and be sure to contact your doctor if you have any problems.  Where can you learn more?  Go to [employer] and enter F075 to learn more about A Healthy Heart: Care Instructions.  Current as of: December 03, 2022  Content Version: 14.5   [address], .   Care instructions adapted under license by [facility]. If you have questions about a medical condition or this instruction, always ask your healthcare professional. Romayne Alderman, [facility], disclaims any warranty or liability for your use of this information.    Personalized Preventive Plan for William Lloyd - 12/07/2023  Medicare offers a range of preventive health benefits. Some of the tests and screenings are paid in full while other may be subject to a deductible, co-insurance, and/or copay.  Some of these benefits include a comprehensive review of your medical history including lifestyle, illnesses that may run in your family, and various assessments and screenings as appropriate.  After reviewing your medical record and screening and assessments performed today your provider may have ordered immunizations, labs, imaging, and/or referrals for you.  A list of these orders (if applicable) as well as your Preventive Care list are included within your After Visit Summary for your review.

## 2023-12-08 ENCOUNTER — Inpatient Hospital Stay: Admit: 2023-12-08 | Payer: Medicare (Managed Care) | Attending: Family Medicine | Primary: Family Medicine

## 2023-12-08 ENCOUNTER — Inpatient Hospital Stay: Admit: 2023-12-08 | Discharge: 2023-12-08 | Payer: Medicare (Managed Care) | Primary: Family Medicine

## 2023-12-08 DIAGNOSIS — M7989 Other specified soft tissue disorders: Principal | ICD-10-CM

## 2023-12-08 DIAGNOSIS — E875 Hyperkalemia: Principal | ICD-10-CM

## 2023-12-08 LAB — POTASSIUM: Potassium: 5.3 mmol/L (ref 3.5–5.3)

## 2023-12-08 NOTE — Other (Signed)
 Tell potassium normal

## 2023-12-18 NOTE — Telephone Encounter (Signed)
 We are contacting you due to a practice schedule change, your appointment scheduled with Dr. Mallie on 08/11/2024 has been cancelled.  Please call our office at [phone] to reschedule.

## 2024-02-05 ENCOUNTER — Encounter

## 2024-02-05 NOTE — Telephone Encounter (Signed)
"  LOV:12/07/23  NOV:06/28/24  Please advise  "

## 2024-02-06 MED ORDER — ATORVASTATIN CALCIUM 80 MG PO TABS
80 | ORAL_TABLET | Freq: Every evening | ORAL | 3 refills | 30.00000 days | Status: AC
Start: 2024-02-06 — End: ?

## 2024-05-30 ENCOUNTER — Telehealth

## 2024-05-30 NOTE — Telephone Encounter (Signed)
 Called pt, lvm. Lab orders are now in system. Will leave copy up front for pt to pick up in office.

## 2024-05-30 NOTE — Telephone Encounter (Signed)
 Please advise.

## 2024-05-30 NOTE — Telephone Encounter (Signed)
 Patient came in he has an appointment next Wednesday. He needs his labs ordered for this appt and he wants to pick up a copy. Please let patient know when they are ordered.

## 2024-06-06 ENCOUNTER — Inpatient Hospital Stay: Admit: 2024-06-06 | Discharge: 2024-06-06 | Payer: Medicare (Managed Care) | Primary: Family Medicine

## 2024-06-06 DIAGNOSIS — I10 Essential (primary) hypertension: Principal | ICD-10-CM

## 2024-06-06 LAB — CBC WITH AUTO DIFFERENTIAL
Basophils %: 0.2 % (ref 0.0–2.0)
Basophils Absolute: 0 10*3/uL (ref 0.0–0.2)
Eosinophils %: 1 % (ref 0.0–7.0)
Eosinophils Absolute: 0.1 10*3/uL (ref 0.0–0.5)
Hematocrit: 47.9 % (ref 38.0–52.0)
Hemoglobin: 15.2 g/dL (ref 13.0–17.3)
Immature Grans (Abs): 0.02 10*3/uL (ref 0.00–0.06)
Immature Granulocytes %: 0.2 % (ref 0.0–0.6)
Lymphocytes Absolute: 1.8 10*3/uL (ref 1.0–3.2)
Lymphocytes: 21.4 % (ref 15.0–45.0)
MCH: 30.6 pg (ref 27.0–34.5)
MCHC: 31.7 g/dL (ref 30.0–36.0)
MCV: 96.6 fL (ref 84.0–100.0)
MPV: 9.7 fL (ref 7.0–12.2)
Monocytes %: 8.4 % (ref 4.0–12.0)
Monocytes Absolute: 0.7 10*3/uL (ref 0.3–1.0)
Neutrophils %: 68.8 % (ref 42.0–74.0)
Neutrophils Absolute: 5.7 10*3/uL (ref 1.6–7.3)
Platelets: 263 10*3/uL (ref 140–440)
RBC: 4.96 x10e6/mcL (ref 4.00–5.60)
RDW: 13 % (ref 10.0–17.0)
WBC: 8.3 10*3/uL (ref 3.8–10.6)

## 2024-06-06 LAB — COMPREHENSIVE METABOLIC PANEL
ALT: 39 U/L (ref 0–42)
AST: 38 U/L (ref 0–46)
Albumin/Globulin Ratio: 1.45 (ref 1.00–2.70)
Albumin: 4.2 g/dL (ref 3.5–5.2)
Alk Phosphatase: 134 U/L — ABNORMAL HIGH (ref 40–130)
Anion Gap: 8 mmol/L (ref 2–17)
BUN: 12 mg/dL (ref 8–23)
CALCIUM,CORRECTED,CCA: 9.5 mg/dL (ref 8.5–10.7)
CO2: 29 mmol/L (ref 22–29)
Calcium: 9.7 mg/dL (ref 8.5–10.7)
Chloride: 104 mmol/L (ref 98–107)
Creatinine: 1.4 mg/dL — ABNORMAL HIGH (ref 0.7–1.3)
Est, Glom Filt Rate: 55 mL/min/{1.73_m2} — ABNORMAL LOW (ref >=60)
Globulin: 2.9 g/dL (ref 1.9–4.4)
Glucose: 98 mg/dL (ref 70–99)
Osmolaliy Calculated: 280 mosm/kg (ref 270–287)
Potassium: 4.8 mmol/L (ref 3.5–5.3)
Sodium: 141 mmol/L (ref 135–145)
Total Bilirubin: 0.71 mg/dL (ref 0.00–1.20)
Total Protein: 7.1 g/dL (ref 5.7–8.3)

## 2024-06-06 LAB — LIPID PANEL
Chol/HDL Ratio: 1.9 (ref 0.0–4.4)
Cholesterol, Total: 100 mg/dL (ref 100–200)
HDL: 53 mg/dL (ref >=40)
LDL Cholesterol: 30.5 mg/dL (ref 0.0–100.0)
LDL/HDL Ratio: 0.6
Triglycerides: 83 mg/dL (ref 0–149)
VLDL: 16.6 mg/dL (ref 5.0–40.0)

## 2024-06-06 LAB — HEMOGLOBIN A1C
Estimated Avg Glucose: 120
Estimated Avg Glucose: 129
Hemoglobin A1C: 5.8 % (ref 4.0–6.0)

## 2024-06-06 LAB — TSH REFLEX TO FT4: TSH: 2.07 u[IU]/mL (ref 0.358–3.740)

## 2024-06-06 NOTE — Result Encounter Note (Signed)
 Scheduled

## 2024-06-08 ENCOUNTER — Ambulatory Visit
Admit: 2024-06-08 | Discharge: 2024-06-08 | Payer: Medicare (Managed Care) | Attending: Family Medicine | Primary: Family Medicine

## 2024-06-08 DIAGNOSIS — E785 Hyperlipidemia, unspecified: Principal | ICD-10-CM

## 2024-06-08 NOTE — Progress Notes (Signed)
 ASSESSMENT/PLAN:     1. Hyperlipidemia, unspecified hyperlipidemia type  Comments:  on statin   continue with diet and lifestyle changes  Orders:  -     Lipid Panel; Future  -     Comprehensive Metabolic Panel; Future  2. Hyperkalemia  Comments:  resloved  Orders:  -     Comprehensive Metabolic Panel; Future  3. Pre-diabetes  -     Hemoglobin A1C; Future  4. Essential hypertension, benign  Comments:  slightly elevated  DASH diet advised  will monitor BP and adjust meds as indicated  Orders:  -     CBC with Auto Differential; Future  5. S/P drug eluting coronary stent placement  Comments:  on ASA and statin  6. Change in weight  -     TSH reflex to FT4; Future    Orders Placed This Encounter   Procedures    CBC with Auto Differential     Standing Status:   Future     Expected Date:   06/08/2024     Expiration Date:   12/06/2025    Lipid Panel     Standing Status:   Future     Expected Date:   06/08/2024     Expiration Date:   12/06/2025     Is Patient Fasting?/# of Hours:   8    Hemoglobin A1C     Standing Status:   Future     Expected Date:   06/08/2024     Expiration Date:   12/06/2025    Comprehensive Metabolic Panel     Standing Status:   Future     Expected Date:   06/08/2024     Expiration Date:   12/06/2025    TSH reflex to FT4     Standing Status:   Future     Expected Date:   06/08/2024     Expiration Date:   12/06/2025       No results found.  There are no Patient Instructions on file for this visit.   No follow-ups on file.          SUBJECTIVE/OBJECTIVE:  William Lloyd (DOB:  February 02, 1956) is a 69 y.o. male, here for evaluation of the following chief complaint(s):  Follow-up and Discuss Labs      He is here for his routine follow up and wanted to discuss labs         Review of Systems   Constitutional:  Negative for appetite change, chills, fatigue and fever.   HENT:  Negative for congestion, hearing loss and sore throat.    Eyes:  Negative for photophobia.   Respiratory:  Negative for cough, shortness of breath and  wheezing.    Cardiovascular:  Negative for chest pain, palpitations and leg swelling.   Gastrointestinal:  Negative for abdominal pain.   Endocrine: Negative for polydipsia, polyphagia and polyuria.   Skin:  Negative for rash.   Neurological:  Negative for syncope, weakness and light-headedness.   Psychiatric/Behavioral:  The patient is not nervous/anxious.          Allergies   Allergen Reactions    Amoxicillin Hives     Other reaction(s): Not Indicated      Past Medical History:   Diagnosis Date    Erectile dysfunction     Hearing loss     History of heart attack     Hypertension     Kidney stone     NSTEMI (non-ST elevated myocardial infarction) (HCC) 10/20/2014  Last Assessment & Plan:  Formatting of this note might be different from the original. Coronary artery disease 10/2014  a. cath 10/23/2014 2.5 x 20 Synergy DES to the LAD, EF 40-45 percent b. relook cath 6/29, patent stents, medical management      Upper GI bleed 07/06/2015    Likely 2/2 NSAIDs, stable     Outpatient Medications Marked as Taking for the 06/08/24 encounter (Office Visit) with Abdel Samie, Swara Donze, MD   Medication Sig Dispense Refill    Coenzyme Q10-Vitamin E (QUNOL ULTRA COQ10 PO) Take by mouth daily      atorvastatin  (LIPITOR) 80 MG tablet Take 1 tablet by mouth nightly at bedtime. 90 tablet 3    metoprolol  tartrate (LOPRESSOR ) 25 MG tablet Take 0.5 tablets by mouth 2 times daily 90 tablet 2    lisinopril  (PRINIVIL ;ZESTRIL ) 10 MG tablet Take 1 tablet by mouth daily 90 tablet 3    aspirin 81 MG EC tablet Take 1 tablet by mouth daily      calcium  carb-cholecalciferol 600-20 MG-MCG TABS Take 1 tablet by mouth daily      Ginkgo Biloba 40 MG TABS Take 60 mg by mouth daily      Multiple Vitamins-Minerals (MENS ONE DAILY PO) Take by mouth      vitamin D (CHOLECALCIFEROL) 25 MCG (1000 UT) TABS tablet Take 1 tablet by mouth daily      Garlic 1000 MG CAPS Take by mouth      zinc gluconate 50 MG tablet Take 1 tablet by mouth daily      omeprazole  (PRILOSEC) 20 MG delayed release capsule Take 1 capsule by mouth daily       Past Surgical History:   Procedure Laterality Date    COLONOSCOPY      EAR SURGERY Left     OTHER SURGICAL HISTORY      heart attack/ stent placement    OTHER SURGICAL HISTORY      bleeding ulcer    PACEMAKER PLACEMENT      TONSILLECTOMY      VASECTOMY       Family History   Problem Relation Age of Onset    Mental Illness Mother     Dementia Mother     Heart Attack Mother     Hypertension Mother     Depression Mother     Heart Attack Sister     Hypertension Sister     No Known Problems Daughter      Social History     Tobacco Use   Smoking Status Never   Smokeless Tobacco Never     Social History     Socioeconomic History    Marital status: Radiation Protection Practitioner     Spouse name: None    Number of children: None    Years of education: None    Highest education level: None   Tobacco Use    Smoking status: Never    Smokeless tobacco: Never   Vaping Use    Vaping status: Never Used   Substance and Sexual Activity    Alcohol use: Never    Drug use: Never    Sexual activity: Yes     Partners: Female     Social Drivers of Health     Physical Activity: Sufficiently Active (11/25/2023)    Exercise Vital Sign     Days of Exercise per Week: 2 days     Minutes of Exercise per Session: 90 min    Received from [facility]    Social Network    Received from [facility]    HITS     OB History   No obstetric history on file.       BP (!) 124/90   Pulse 71   Ht 1.753 m (5' 9)   Wt 92.6 kg (204 lb 1.6 oz)   SpO2 98%   BMI 30.14 kg/m    Physical Exam  Constitutional:       Appearance: Normal appearance.   HENT:      Head: Normocephalic and atraumatic.      Right Ear: External ear normal.      Left Ear: External ear normal.      Nose: Nose normal.      Mouth/Throat:      Mouth: Mucous membranes are moist.   Eyes:      Extraocular Movements: Extraocular movements intact.   Cardiovascular:      Rate and Rhythm: Normal rate and regular rhythm.      Pulses: Normal  pulses.      Heart sounds: Normal heart sounds.   Abdominal:      General: Abdomen is flat. Bowel sounds are normal.      Palpations: Abdomen is soft.   Musculoskeletal:         General: Normal range of motion.      Cervical back: Normal range of motion and neck supple.   Skin:     General: Skin is warm.      Findings: No rash.   Neurological:      General: No focal deficit present.      Mental Status: He is alert and oriented to person, place, and time. Mental status is at baseline.   Psychiatric:         Mood and Affect: Mood normal.                An electronic signature was used to authenticate this note.    --Aviv Rota Abdel Samie, MD
